# Patient Record
Sex: Female | Born: 1970 | Race: White | Hispanic: No | Smoking: Never smoker
Health system: Southern US, Community
[De-identification: ages and names within clinical notes are randomized; demographics above are authoritative.]

## PROBLEM LIST (undated history)

## (undated) ENCOUNTER — Emergency Department

## (undated) DIAGNOSIS — G894 Chronic pain syndrome: Secondary | ICD-10-CM

## (undated) DIAGNOSIS — G8929 Other chronic pain: Secondary | ICD-10-CM

## (undated) DIAGNOSIS — K589 Irritable bowel syndrome without diarrhea: Secondary | ICD-10-CM

## (undated) DIAGNOSIS — G4730 Sleep apnea, unspecified: Secondary | ICD-10-CM

## (undated) DIAGNOSIS — K219 Gastro-esophageal reflux disease without esophagitis: Secondary | ICD-10-CM

## (undated) DIAGNOSIS — M5416 Radiculopathy, lumbar region: Secondary | ICD-10-CM

## (undated) DIAGNOSIS — F419 Anxiety disorder, unspecified: Secondary | ICD-10-CM

## (undated) DIAGNOSIS — Z87442 Personal history of urinary calculi: Secondary | ICD-10-CM

## (undated) DIAGNOSIS — M5412 Radiculopathy, cervical region: Secondary | ICD-10-CM

## (undated) DIAGNOSIS — G4700 Insomnia, unspecified: Secondary | ICD-10-CM

## (undated) DIAGNOSIS — R011 Cardiac murmur, unspecified: Secondary | ICD-10-CM

## (undated) DIAGNOSIS — D219 Benign neoplasm of connective and other soft tissue, unspecified: Secondary | ICD-10-CM

## (undated) DIAGNOSIS — F329 Major depressive disorder, single episode, unspecified: Secondary | ICD-10-CM

## (undated) DIAGNOSIS — M1990 Unspecified osteoarthritis, unspecified site: Secondary | ICD-10-CM

## (undated) DIAGNOSIS — I10 Essential (primary) hypertension: Secondary | ICD-10-CM

## (undated) DIAGNOSIS — N200 Calculus of kidney: Secondary | ICD-10-CM

## (undated) DIAGNOSIS — F32A Depression, unspecified: Secondary | ICD-10-CM

## (undated) HISTORY — DX: Benign neoplasm of connective and other soft tissue, unspecified: D21.9

## (undated) HISTORY — DX: Gastro-esophageal reflux disease without esophagitis: K21.9

## (undated) HISTORY — PX: CHOLECYSTECTOMY: SHX55

## (undated) HISTORY — PX: TONSILLECTOMY: SUR1361

## (undated) HISTORY — DX: Depression, unspecified: F32.A

## (undated) HISTORY — DX: Calculus of kidney: N20.0

## (undated) HISTORY — PX: ROTATOR CUFF REPAIR: SHX139

## (undated) HISTORY — PX: GASTRIC ROUX-EN-Y: SHX5262

## (undated) HISTORY — DX: Insomnia, unspecified: G47.00

## (undated) HISTORY — DX: Anxiety disorder, unspecified: F41.9

## (undated) HISTORY — DX: Essential (primary) hypertension: I10

---
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
## 2008-12-04 HISTORY — PX: ROTATOR CUFF REPAIR: SHX139

## 2011-12-05 HISTORY — PX: GASTRIC ROUX-EN-Y: SHX5262

## 2013-04-14 DIAGNOSIS — F419 Anxiety disorder, unspecified: Secondary | ICD-10-CM | POA: Insufficient documentation

## 2014-04-17 DIAGNOSIS — N946 Dysmenorrhea, unspecified: Secondary | ICD-10-CM | POA: Insufficient documentation

## 2016-12-04 HISTORY — PX: CERVICAL CONE BIOPSY: SUR198

## 2016-12-04 HISTORY — PX: BLADDER STONE REMOVAL: SHX568

## 2017-07-03 DIAGNOSIS — M26629 Arthralgia of temporomandibular joint, unspecified side: Secondary | ICD-10-CM | POA: Insufficient documentation

## 2017-10-11 DIAGNOSIS — Z8659 Personal history of other mental and behavioral disorders: Secondary | ICD-10-CM | POA: Insufficient documentation

## 2017-10-11 DIAGNOSIS — Z9884 Bariatric surgery status: Secondary | ICD-10-CM | POA: Insufficient documentation

## 2017-10-11 DIAGNOSIS — K219 Gastro-esophageal reflux disease without esophagitis: Secondary | ICD-10-CM | POA: Insufficient documentation

## 2018-12-16 ENCOUNTER — Emergency Department: Payer: BLUE CROSS/BLUE SHIELD

## 2018-12-16 ENCOUNTER — Encounter: Payer: Self-pay | Admitting: Emergency Medicine

## 2018-12-16 ENCOUNTER — Emergency Department
Admission: EM | Admit: 2018-12-16 | Discharge: 2018-12-16 | Disposition: A | Discharge status: Home / Self Care | Payer: BLUE CROSS/BLUE SHIELD | Attending: Emergency Medicine | Admitting: Emergency Medicine

## 2018-12-16 ENCOUNTER — Other Ambulatory Visit: Payer: Self-pay

## 2018-12-16 DIAGNOSIS — N390 Urinary tract infection, site not specified: Secondary | ICD-10-CM

## 2018-12-16 DIAGNOSIS — R1032 Left lower quadrant pain: Secondary | ICD-10-CM | POA: Diagnosis present

## 2018-12-16 LAB — URINALYSIS, COMPLETE (UACMP) WITH MICROSCOPIC
Bilirubin Urine: NEGATIVE
Glucose, UA: NEGATIVE mg/dL
Ketones, ur: 5 mg/dL — AB
Nitrite: NEGATIVE
Protein, ur: 100 mg/dL — AB
RBC / HPF: >50 RBC/hpf — ABNORMAL HIGH (ref 0–5)
Specific Gravity, Urine: 1.027 (ref 1.005–1.030)
Squamous Epithelial / HPF: >50 — ABNORMAL HIGH (ref 0–5)
WBC, UA: >50 WBC/hpf — ABNORMAL HIGH (ref 0–5)
pH: 5 (ref 5.0–8.0)

## 2018-12-16 LAB — COMPREHENSIVE METABOLIC PANEL
ALT: 68 U/L — ABNORMAL HIGH (ref 0–44)
AST: 81 U/L — ABNORMAL HIGH (ref 15–41)
Albumin: 4.8 g/dL (ref 3.5–5.0)
Alkaline Phosphatase: 107 U/L (ref 38–126)
Anion gap: 12 (ref 5–15)
BUN: 19 mg/dL (ref 6–20)
CO2: 22 mmol/L (ref 22–32)
Calcium: 9.4 mg/dL (ref 8.9–10.3)
Chloride: 105 mmol/L (ref 98–111)
Creatinine, Ser: 1.14 mg/dL — ABNORMAL HIGH (ref 0.44–1.00)
GFR calc Af Amer: >60 mL/min (ref >60)
GFR calc non Af Amer: 56 mL/min — ABNORMAL LOW (ref >60)
Glucose, Bld: 124 mg/dL — ABNORMAL HIGH (ref 70–99)
Potassium: 4.1 mmol/L (ref 3.5–5.1)
Sodium: 139 mmol/L (ref 135–145)
Total Bilirubin: 1.6 mg/dL — ABNORMAL HIGH (ref 0.3–1.2)
Total Protein: 8.3 g/dL — ABNORMAL HIGH (ref 6.5–8.1)

## 2018-12-16 LAB — CBC
HCT: 43.9 % (ref 36.0–46.0)
Hemoglobin: 13.9 g/dL (ref 12.0–15.0)
MCH: 27.9 pg (ref 26.0–34.0)
MCHC: 31.7 g/dL (ref 30.0–36.0)
MCV: 88 fL (ref 80.0–100.0)
Platelets: 312 10*3/uL (ref 150–400)
RBC: 4.99 MIL/uL (ref 3.87–5.11)
RDW: 13 % (ref 11.5–15.5)
WBC: 14.3 10*3/uL — ABNORMAL HIGH (ref 4.0–10.5)
nRBC: 0 % (ref 0.0–0.2)

## 2018-12-16 LAB — POCT PREGNANCY, URINE: Preg Test, Ur: NEGATIVE

## 2018-12-16 MED ORDER — KETOROLAC TROMETHAMINE 30 MG/ML IJ SOLN
30.0000 mg | Freq: Once | INTRAMUSCULAR | Status: AC
  Administered 2018-12-16: 30 mg via INTRAVENOUS
  Filled 2018-12-16: qty 1

## 2018-12-16 MED ORDER — SODIUM CHLORIDE 0.9 % IV SOLN
1000.0000 mL | Freq: Once | INTRAVENOUS | Status: AC
  Administered 2018-12-16: 1000 mL via INTRAVENOUS

## 2018-12-16 MED ORDER — NITROFURANTOIN MONOHYD MACRO 100 MG PO CAPS: 100.0000 mg | ORAL_CAPSULE | Freq: Two times a day (BID) | ORAL | 0 refills | Status: AC

## 2018-12-16 MED ORDER — SODIUM CHLORIDE 0.9 % IV BOLUS
1000.0000 mL | Freq: Once | INTRAVENOUS | Status: AC
  Administered 2018-12-16: 1000 mL via INTRAVENOUS

## 2018-12-16 MED ORDER — ONDANSETRON HCL 4 MG/2ML IJ SOLN
4.0000 mg | Freq: Once | INTRAMUSCULAR | Status: AC
  Administered 2018-12-16: 4 mg via INTRAVENOUS
  Filled 2018-12-16: qty 2

## 2018-12-16 MED ORDER — DIPHENHYDRAMINE HCL 25 MG PO CAPS
25.0000 mg | ORAL_CAPSULE | Freq: Once | ORAL | Status: AC
  Administered 2018-12-16: 25 mg via ORAL

## 2018-12-16 MED ORDER — CIPROFLOXACIN IN D5W 400 MG/200ML IV SOLN
400.0000 mg | Freq: Once | INTRAVENOUS | Status: AC
  Administered 2018-12-16: 400 mg via INTRAVENOUS
  Filled 2018-12-16: qty 200

## 2018-12-16 MED ORDER — DIPHENHYDRAMINE HCL 25 MG PO CAPS
ORAL_CAPSULE | ORAL | Status: AC
  Filled 2018-12-16: qty 1

## 2018-12-16 NOTE — ED Notes (Signed)
Pt was given food tray.

## 2018-12-16 NOTE — ED Notes (Signed)
First nurse note:pt arrived via ems from [place] clinic at [facility] with chief complaints of fever & weakness since this morning. Pt also reports left flank pain. Pt negative for flu and UTI at [facility]. HR 97, BP 101/59, 98% on room air.

## 2018-12-16 NOTE — ED Triage Notes (Signed)
Flank pain x 4 days. Dysuria x 2 days. History of kidney infections.

## 2018-12-16 NOTE — ED Provider Notes (Signed)
[facility] Emergency Department Provider Note   ____________________________________________    I have reviewed the triage vital signs and the nursing notes.   HISTORY  Chief Complaint Flank Pain     HPI Natasha Estrada is a 48 y.o. female who presents with complaints of dysuria, urinary frequency and flank pain.  She also reports she had a fever today of 103.0.  She notes that she has had a long history of problems with urinary tract infections and in fact was on daily Macrobid for a long time until recently moving to the area.  She took Azo several days ago with some improvement in her symptoms.  However today she feels weak achy and has a fever.  Positive nausea.  History reviewed. No pertinent past medical history.  There are no active problems to display for this patient.   History reviewed. No pertinent surgical history.  Prior to Admission medications   Medication Sig Start Date End Date Taking? Authorizing Provider  nitrofurantoin, macrocrystal-monohydrate, (MACROBID) 100 MG capsule Take 1 capsule (100 mg total) by mouth 2 (two) times daily for 7 days. 12/16/18 12/23/18  Lavonia Drafts, MD     Allergies Cefuroxime and Sulfa antibiotics  No family history on file.  Social History Does not smoke, no  daily alcohol Review of Systems  Constitutional: Fever as above Eyes: No visual changes.  ENT: No sore throat. Cardiovascular: Denies chest pain. Respiratory: Denies shortness of breath. Gastrointestinal: As above.   Genitourinary: As above. Musculoskeletal: "Achy " Skin: Negative for rash. Neurological: Negative for headaches or weakness   ____________________________________________   PHYSICAL EXAM:  VITAL SIGNS: ED Triage Vitals  Enc Vitals Group     BP 12/16/18 1238 (!) 114/96     Pulse Rate 12/16/18 1238 (!) 107     Resp 12/16/18 1238 18     Temp 12/16/18 1238 98.8 F (37.1 C)     Temp Source 12/16/18 1238 Oral   SpO2 12/16/18 1238 99 %     Weight 12/16/18 1239 76.2 kg (168 lb)     Height 12/16/18 1239 1.651 m (5\' 5" )     Head Circumference --      Peak Flow --      Pain Score 12/16/18 1239 7     Pain Loc --      Pain Edu? --      Excl. in [place]? --     Constitutional: Alert and oriented. Eyes: Conjunctivae are normal.   Nose: No congestion/rhinnorhea. Mouth/Throat: Mucous membranes are moist.    Cardiovascular: Normal rate, regular rhythm. Grossly normal heart sounds.  Good peripheral circulation. Respiratory: Normal respiratory effort.  No retractions. Lungs CTAB. Gastrointestinal: Soft and nontender. No distention.  Mild left CVA tenderness  Musculoskeletal:  Warm and well perfused Neurologic:  Normal speech and language. No gross focal neurologic deficits are appreciated.  Skin:  Skin is warm, dry and intact. No rash noted. Psychiatric: Mood and affect are normal. Speech and behavior are normal.  ____________________________________________   LABS (all labs ordered are listed, but only abnormal results are displayed)  Labs Reviewed  COMPREHENSIVE METABOLIC PANEL - Abnormal; Notable for the following components:      Result Value   Glucose, Bld 124 (*)    Creatinine, Ser 1.14 (*)    Total Protein 8.3 (*)    AST 81 (*)    ALT 68 (*)    Total Bilirubin 1.6 (*)    GFR calc non Af Amer 56 (*)  All other components within normal limits  CBC - Abnormal; Notable for the following components:   WBC 14.3 (*)    All other components within normal limits  URINALYSIS, COMPLETE (UACMP) WITH MICROSCOPIC - Abnormal; Notable for the following components:   Color, Urine AMBER (*)    APPearance TURBID (*)    Hgb urine dipstick SMALL (*)    Ketones, ur 5 (*)    Protein, ur 100 (*)    Leukocytes, UA MODERATE (*)    RBC / HPF >50 (*)    WBC, UA >50 (*)    Bacteria, UA MANY (*)    Squamous Epithelial / LPF >50 (*)    All other components within normal limits  POCT PREGNANCY, URINE  POC  URINE PREG, ED   ____________________________________________  EKG  None ____________________________________________  RADIOLOGY  CT renal stone study ____________________________________________   PROCEDURES  Procedure(s) performed: No  Procedures   Critical Care performed: No ____________________________________________   INITIAL IMPRESSION / ASSESSMENT AND PLAN / ED COURSE  Pertinent labs & imaging results that were available during my care of the patient were reviewed by me and considered in my medical decision making (see chart for details).  Patient presents with dysuria, frequency, reported fever at home.  White blood cell count is elevated here.  Urinalysis consistent with urinary tract infection.  Patient does have a history of kidney stones as well, will send for CT renal stone study.  Suspect severe urinary tract infection, possibly pyelonephritis.  We will give IV antibiotics here, patient has an allergy to cefuroxime so we will give Cipro IV  ----------------------------------------- 7:11 PM on 12/16/2018 -----------------------------------------  CT negative for ureterolithiasis, patient felt better after fluids and antibiotics.  Appropriate for discharge at this time, strict return precautions discussed, patient agrees with plan    ____________________________________________   FINAL CLINICAL IMPRESSION(S) / ED DIAGNOSES  Final diagnoses:  Lower urinary tract infectious disease        Note:  This document was prepared using Dragon voice recognition software and may include unintentional dictation errors.   Lavonia Drafts, MD 12/16/18 1911

## 2018-12-16 NOTE — ED Notes (Signed)
Pt requesting "something to make me rest". MD made aware.

## 2019-04-09 ENCOUNTER — Ambulatory Visit: Payer: Self-pay | Admitting: Urology

## 2019-05-19 ENCOUNTER — Ambulatory Visit: Payer: Self-pay | Admitting: Urology

## 2019-06-16 ENCOUNTER — Encounter

## 2019-06-16 ENCOUNTER — Encounter: Payer: Self-pay | Admitting: Urology

## 2019-06-16 ENCOUNTER — Ambulatory Visit: Payer: BLUE CROSS/BLUE SHIELD | Admitting: Urology

## 2019-08-29 DIAGNOSIS — Z9889 Other specified postprocedural states: Secondary | ICD-10-CM | POA: Insufficient documentation

## 2019-08-29 DIAGNOSIS — M7582 Other shoulder lesions, left shoulder: Secondary | ICD-10-CM | POA: Insufficient documentation

## 2019-08-29 DIAGNOSIS — M75112 Incomplete rotator cuff tear or rupture of left shoulder, not specified as traumatic: Secondary | ICD-10-CM | POA: Insufficient documentation

## 2019-09-12 ENCOUNTER — Other Ambulatory Visit: Payer: Self-pay

## 2019-09-12 ENCOUNTER — Encounter: Payer: Self-pay | Admitting: Urology

## 2019-09-12 ENCOUNTER — Ambulatory Visit: Payer: BC Managed Care – PPO | Admitting: Urology

## 2019-09-12 VITALS — BP 150/98 | HR 87 | Ht 65.0 in | Wt 170.0 lb

## 2019-09-12 DIAGNOSIS — N920 Excessive and frequent menstruation with regular cycle: Secondary | ICD-10-CM | POA: Insufficient documentation

## 2019-09-12 DIAGNOSIS — N200 Calculus of kidney: Secondary | ICD-10-CM

## 2019-09-12 DIAGNOSIS — N390 Urinary tract infection, site not specified: Secondary | ICD-10-CM | POA: Diagnosis not present

## 2019-09-12 DIAGNOSIS — G4700 Insomnia, unspecified: Secondary | ICD-10-CM | POA: Insufficient documentation

## 2019-09-12 LAB — URINALYSIS, COMPLETE
Bilirubin, UA: NEGATIVE
Glucose, UA: NEGATIVE
Ketones, UA: NEGATIVE
Leukocytes,UA: NEGATIVE
Nitrite, UA: NEGATIVE
Protein,UA: NEGATIVE
Specific Gravity, UA: 1.025 (ref 1.005–1.030)
Urobilinogen, Ur: 1 mg/dL (ref 0.2–1.0)
pH, UA: 7 (ref 5.0–7.5)

## 2019-09-12 LAB — MICROSCOPIC EXAMINATION

## 2019-09-12 MED ORDER — NITROFURANTOIN MACROCRYSTAL 100 MG PO CAPS: ORAL_CAPSULE | ORAL | 1 refills | Status: DC

## 2019-09-12 NOTE — Progress Notes (Signed)
09/12/2019 11:38 AM   Natasha Estrada 10-27-1971 [id number]  Referring provider: Wayland Denis, PA-C [address] [place],  [place] [address]  Chief Complaint  Patient presents with  . Recurrent UTI    HPI: Natasha Estrada is a 48 YO female seen in consultation at the request of Wayland Denis, PA-C for evaluation of recurrent UTIs.  She states she has a lifelong history of recurrent urinary tract infections.  For several years she was on postcoital prophylaxis with nitrofurantoin but not recently.  She has most recently been followed by [facility] and was last seen there around 2018.  She has a history of recurrent stone disease and underwent ureteroscopic stone removal and her own in November 2018.  She has a prior history of kidney stone surgery in 2008.  Most recent imaging remarkable for a stone protocol CT of the abdomen and pelvis January 2020 which showed bilateral, nonobstructing renal calculi the largest measuring 4 mm.  She was seen at [facility] January 2020 with urgency, dysuria and fever of 103 degrees.  Urinalysis showed pyuria.  Was recommended she be evaluated in the ED.  CT was performed with findings as above.  She received 1 dose of IV Cipro however was discharged on Macrobid.  Urine culture did grow greater than 100,000 E. coli.  After completing the Macrobid course she states her symptoms never completely resolved.  Follow-up urine cultures in early/late March and early May all grew E. coli.  She was treated with doxycycline and then another round of Macrobid.  She states her symptoms did not clear until she completed a course of Augmentin and has been asymptomatic since that time.  She has had prior UTI symptoms after intercourse though her husband has been in poor health and does not have frequent sexual activity.   PMH: Past Medical History:  Diagnosis Date  . Anxiety   . Depression   . Fibroid   . GERD (gastroesophageal  reflux disease)   . Hypertension   . Insomnia   . Kidney stone     Surgical History: Past Surgical History:  Procedure Laterality Date  . GASTRIC ROUX-EN-Y    . ROTATOR CUFF REPAIR      Home Medications:  Allergies as of 09/12/2019      Reactions   Cefuroxime Shortness Of Breath   Sulfa Antibiotics Rash      Medication List       Accurate as of September 12, 2019 11:38 AM. If you have any questions, ask your nurse or doctor.        buPROPion 150 MG 24 hr tablet Commonly known as: WELLBUTRIN XL   busPIRone 7.5 MG tablet Commonly known as: BUSPAR TK 1 T PO BID   cyanocobalamin 1000 MCG/ML injection Commonly known as: (VITAMIN B-12) Inject into the muscle.   cyclobenzaprine 10 MG tablet Commonly known as: FLEXERIL TK 1 T PO BID UTD   gabapentin 100 MG capsule Commonly known as: NEURONTIN TK 1 C PO BID   gabapentin 300 MG capsule Commonly known as: NEURONTIN TK 2 CS PO QD HS   levonorgestrel 20 MCG/24HR IUD Commonly known as: MIRENA by Intrauterine route.   linaclotide 145 MCG Caps capsule Commonly known as: LINZESS Take by mouth.   losartan 25 MG tablet Commonly known as: COZAAR TK 1 T PO QD   meloxicam 15 MG tablet Commonly known as: MOBIC TK 1 T PO D.   omeprazole 20 MG capsule Commonly known as: PRILOSEC  Take by mouth.   propranolol 40 MG tablet Commonly known as: INDERAL TAKE 1 TABLET(40 MG) BY MOUTH TWICE DAILY AS NEEDED. PATIENT TAKE AS NEEDED   venlafaxine XR 75 MG 24 hr capsule Commonly known as: EFFEXOR-XR TK 1 C PO QD       Allergies:  Allergies  Allergen Reactions  . Cefuroxime Shortness Of Breath  . Sulfa Antibiotics Rash    Family History: No family history on file.  Social History:  reports that she has never smoked. She has never used smokeless tobacco. She reports previous alcohol use. She reports that she does not use drugs.  ROS: UROLOGY Frequent Urination?: No Hard to postpone urination?: No Burning/pain with  urination?: No Get up at night to urinate?: Yes Leakage of urine?: No Urine stream starts and stops?: Yes Trouble starting stream?: No Do you have to strain to urinate?: No Blood in urine?: No Urinary tract infection?: No Sexually transmitted disease?: No Injury to kidneys or bladder?: No Painful intercourse?: No Weak stream?: No Currently pregnant?: No Vaginal bleeding?: No Last menstrual period?: IUD  Gastrointestinal Nausea?: No Vomiting?: No Indigestion/heartburn?: No Diarrhea?: No Constipation?: No  Constitutional Fever: No Night sweats?: No Weight loss?: No Fatigue?: No  Skin Skin rash/lesions?: No Itching?: No  Eyes Blurred vision?: Yes Double vision?: No  Ears/Nose/Throat Sore throat?: No Sinus problems?: Yes  Hematologic/Lymphatic Swollen glands?: No Easy bruising?: Yes  Cardiovascular Leg swelling?: No Chest pain?: No  Respiratory Cough?: No Shortness of breath?: No  Endocrine Excessive thirst?: No  Musculoskeletal Back pain?: Yes Joint pain?: Yes  Neurological Headaches?: Yes Dizziness?: No  Psychologic Depression?: Yes Anxiety?: Yes  Physical Exam: BP (!) 150/98 (BP Location: Left Arm, Patient Position: Sitting, Cuff Size: Normal)   Pulse 87   Ht 5\' 5"  (1.651 m)   Wt 170 lb (77.1 kg)   BMI 28.29 kg/m   Constitutional:  Alert and oriented, No acute distress. HEENT: [place] AT, moist mucus membranes.  Trachea midline, no masses. Cardiovascular: No clubbing, cyanosis, or edema. Respiratory: Normal respiratory effort, no increased work of breathing. GI: Abdomen is soft, nontender, nondistended, no abdominal masses GU: No CVA tenderness Lymph: No cervical or inguinal lymphadenopathy. Skin: No rashes, bruises or suspicious lesions. Neurologic: Grossly intact, no focal deficits, moving all 4 extremities. Psychiatric: Normal mood and affect.  Laboratory Data:  Urinalysis Dipstick trace blood, leukocytes negative Microscopy  negative  Pertinent Imaging: CT images were reviewed  Results for orders placed during the hospital encounter of 12/16/18  CT RENAL STONE STUDY   Narrative CLINICAL DATA:  48 year old female with history of left-sided flank pain.  EXAM: CT ABDOMEN AND PELVIS WITHOUT CONTRAST  TECHNIQUE: Multidetector CT imaging of the abdomen and pelvis was performed following the standard protocol without IV contrast.  COMPARISON:  No priors.  FINDINGS: Lower chest: Unremarkable.  Hepatobiliary: No definite suspicious appearing cystic or solid hepatic lesions are confidently identified on today's noncontrast CT examination. Status post cholecystectomy.  Pancreas: No definite pancreatic mass or peripancreatic fluid or inflammatory changes are noted on today's noncontrast CT examination.  Spleen: Unremarkable.  Adrenals/Urinary Tract: Several small nonobstructive calculi are noted within the left renal collecting system, largest of which is in the interpolar region measuring 4 mm. 2 mm nonobstructive calculus in the lower pole collecting system of the right kidney also noted. No additional calculi are noted along the course of either ureter, or within the lumen of the urinary bladder. No hydroureteronephrosis. Multiple capsular calcifications are noted in the right  kidney, likely related to remote trauma. Unenhanced appearance of the urinary bladder is normal. Bilateral adrenal glands are normal in appearance.  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 No pathologic dilatation of small bowel or colon. Normal appendix.  Vascular/Lymphatic: No atherosclerotic calcifications in the abdominal aorta or pelvic vasculature. No lymphadenopathy noted in the abdomen or pelvis.  Reproductive: IUD present in the uterus. Ovaries are unremarkable in appearance.  Other: No significant volume of ascites.  No pneumoperitoneum.   Musculoskeletal: There are no aggressive appearing lytic or blastic lesions noted in the visualized portions of the skeleton.  IMPRESSION: 1. Several nonobstructive calculi are noted within both renal collecting systems (left greater than right) measuring up to 4 mm. No ureteral stones and no hydroureteronephrosis to indicate urinary tract obstruction at this time. 2. No other acute findings are noted to account for the patient's history of left-sided flank pain. 3. Additional incidental findings, as above.   Electronically Signed   By: Vinnie Langton M.D.   On: 12/16/2018 15:18     Assessment & Plan:    - Recurrent UTI Febrile UTI January 2020 consistent with pyelonephritis.  Nitrofurantoin does not achieve tissue penetration and is not an appropriate choice for febrile UTI/pyelonephritis.  Her symptoms have presently resolved.  Will place back on nitrofurantoin prophylaxis however instead of a daily dose only postcoital.  I also recommended she start cranberry tablets and D-mannose 2 g daily.  Follow-up 3 months or as needed for recurrent symptoms.   Abbie Sons, [place] [address], [place], [place] [address] [phone]

## 2019-10-06 ENCOUNTER — Encounter: Payer: Self-pay | Admitting: Emergency Medicine

## 2019-10-06 ENCOUNTER — Telehealth: Payer: Self-pay | Admitting: Emergency Medicine

## 2019-10-06 ENCOUNTER — Emergency Department
Admission: EM | Admit: 2019-10-06 | Discharge: 2019-10-06 | Disposition: A | Discharge status: Home / Self Care | Payer: BC Managed Care – PPO | Attending: Emergency Medicine | Admitting: Emergency Medicine

## 2019-10-06 ENCOUNTER — Emergency Department: Payer: BC Managed Care – PPO

## 2019-10-06 ENCOUNTER — Other Ambulatory Visit: Payer: Self-pay

## 2019-10-06 DIAGNOSIS — N390 Urinary tract infection, site not specified: Secondary | ICD-10-CM | POA: Insufficient documentation

## 2019-10-06 DIAGNOSIS — J0100 Acute maxillary sinusitis, unspecified: Secondary | ICD-10-CM | POA: Insufficient documentation

## 2019-10-06 DIAGNOSIS — U071 COVID-19: Secondary | ICD-10-CM | POA: Diagnosis not present

## 2019-10-06 DIAGNOSIS — I10 Essential (primary) hypertension: Secondary | ICD-10-CM | POA: Diagnosis not present

## 2019-10-06 DIAGNOSIS — Z79899 Other long term (current) drug therapy: Secondary | ICD-10-CM | POA: Diagnosis not present

## 2019-10-06 DIAGNOSIS — R05 Cough: Secondary | ICD-10-CM | POA: Diagnosis present

## 2019-10-06 LAB — URINALYSIS, COMPLETE (UACMP) WITH MICROSCOPIC
Bilirubin Urine: NEGATIVE
Glucose, UA: NEGATIVE mg/dL
Hgb urine dipstick: NEGATIVE
Ketones, ur: NEGATIVE mg/dL
Leukocytes,Ua: NEGATIVE
Nitrite: POSITIVE — AB
Protein, ur: NEGATIVE mg/dL
Specific Gravity, Urine: 1.012 (ref 1.005–1.030)
pH: 6 (ref 5.0–8.0)

## 2019-10-06 LAB — SARS CORONAVIRUS 2 (TAT 6-24 HRS): SARS Coronavirus 2: POSITIVE — AB

## 2019-10-06 MED ORDER — FEXOFENADINE-PSEUDOEPHED ER 60-120 MG PO TB12: 1.0000 | ORAL_TABLET | Freq: Two times a day (BID) | ORAL | 0 refills | Status: DC

## 2019-10-06 MED ORDER — CLINDAMYCIN HCL 300 MG PO CAPS: 300.0000 mg | ORAL_CAPSULE | Freq: Three times a day (TID) | ORAL | 0 refills | Status: AC

## 2019-10-06 MED ORDER — BENZONATATE 100 MG PO CAPS: 200.0000 mg | ORAL_CAPSULE | Freq: Three times a day (TID) | ORAL | 0 refills | Status: DC | PRN

## 2019-10-06 MED ORDER — IBUPROFEN 600 MG PO TABS: 600.0000 mg | ORAL_TABLET | Freq: Three times a day (TID) | ORAL | 0 refills | Status: DC | PRN

## 2019-10-06 NOTE — ED Triage Notes (Addendum)
Pt reports feeling bad for 5 days-cough, body aches, "sloshing in my ears"; body aches and runny nose; pt says she has a co-worker that's been coughing at work but no known COVID; reports temp 100.2 at home; talking in complete coherent sentences; pt adds she's recently been down at [facility]; husband is also here to be seen with similar symptoms; pt has taken no medication for her symptoms, "I thought I'd wait until I got here"

## 2019-10-06 NOTE — ED Notes (Signed)
See triage note  Presents with body aches ,slight cough and subjective since yesterday   Low grade fever on arrival

## 2019-10-06 NOTE — ED Provider Notes (Signed)
[facility] Emergency Department Provider Note   ____________________________________________   First MD Initiated Contact with Patient 10/06/19 [phone]     (approximate)  I have reviewed the triage vital signs and the nursing notes.   HISTORY  Chief Complaint Cough, Generalized Body Aches, Nasal Congestion, and Otalgia    HPI Natasha Estrada is a 48 y.o. female patient presents with fatigue, body aches, nonproductive cough, ear pressure, intermittent runny nose and nasal congestion.  Patient just returned back from [facility] yesterday for her husband who is also here with same complaints.  Patient denies nausea, vomiting, diarrhea.  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  Patient stated no known exposure to COVID-19.  Patient also complained of dysuria.  History of kidney stones.  Patient rates her pain discomfort a 5/10.  Patient described the pain as "achy".  No palliative measures for complaint.         Past Medical History:  Diagnosis Date  . Anxiety   . Depression   . Fibroid   . GERD (gastroesophageal reflux disease)   . Hypertension   . Insomnia   . Kidney stone     Patient Active Problem List   Diagnosis Date Noted  . Insomnia 09/12/2019  . Menorrhagia with regular cycle 09/12/2019  . Nephrolithiasis 09/12/2019  . Recurrent UTI 09/12/2019  . Nontraumatic incomplete tear of left rotator cuff 08/29/2019  . Rotator cuff tendinitis, left 08/29/2019  . Status post left rotator cuff repair 08/29/2019  . Gastroesophageal reflux disease without esophagitis 10/11/2017  . History of depression 10/11/2017  . 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 Dysmenorrhea 04/17/2014  . Anxiety 04/14/2013    Past Surgical History:  Procedure Laterality Date  . GASTRIC ROUX-EN-Y    . ROTATOR CUFF REPAIR      Prior to Admission medications   Medication Sig Start  Date End Date Taking? Authorizing Provider  benzonatate (TESSALON PERLES) 100 MG capsule Take 2 capsules (200 mg total) by mouth 3 (three) times daily as needed. 10/06/19 10/05/20  Sable Feil, PA-C  buPROPion (WELLBUTRIN XL) 150 MG 24 hr tablet  08/27/19   [provider]  busPIRone (BUSPAR) 7.5 MG tablet TK 1 T PO BID 07/31/19   [provider]  clindamycin (CLEOCIN) 300 MG capsule Take 1 capsule (300 mg total) by mouth 3 (three) times daily for 10 days. 10/06/19 10/16/19  Sable Feil, PA-C  cyanocobalamin (,VITAMIN B-12,) 1000 MCG/ML injection Inject into the muscle. 12/23/18   [provider]  cyclobenzaprine (FLEXERIL) 10 MG tablet TK 1 T PO BID UTD 09/03/19   [provider]  fexofenadine-pseudoephedrine (ALLEGRA-D) 60-120 MG 12 hr tablet Take 1 tablet by mouth 2 (two) times daily. 10/06/19   Sable Feil, PA-C  gabapentin (NEURONTIN) 100 MG capsule TK 1 C PO BID 09/03/19   [provider]  gabapentin (NEURONTIN) 300 MG capsule TK 2 CS PO QD HS 09/03/19   [provider]  ibuprofen (ADVIL) 600 MG tablet Take 1 tablet (600 mg total) by mouth every 8 (eight) hours as needed. 10/06/19   Sable Feil, PA-C  levonorgestrel (MIRENA) 20 MCG/24HR IUD by Intrauterine route.    [provider]  linaclotide Rolan Lipa) 145 MCG CAPS capsule Take by mouth. 12/23/18   [provider]  losartan (COZAAR) 25 MG tablet TK 1 T PO QD 08/26/19   [provider]  meloxicam (MOBIC) 15 MG tablet TK 1 T PO D. 08/07/19   [provider]  nitrofurantoin (MACRODANTIN) 100 MG capsule 1 capsule p.o. after intercourse 09/12/19   Stoioff, Ronda Fairly, MD  omeprazole (PRILOSEC) 20 MG capsule Take by mouth. 09/07/15 12/27/19  [provider]  propranolol (INDERAL) 40 MG tablet TAKE 1 TABLET(40 MG) BY MOUTH TWICE DAILY AS NEEDED. PATIENT TAKE AS NEEDED 05/26/19   [provider]  venlafaxine XR (EFFEXOR-XR) 75 MG 24 hr capsule TK 1  C PO QD 06/24/19   [provider]    Allergies Cefuroxime and Sulfa antibiotics  History reviewed. No pertinent family history.  Social History Social History   Tobacco Use  . Smoking status: Never Smoker  . Smokeless tobacco: Never Used  Substance Use Topics  . Alcohol use: Not Currently  . Drug use: Never    Review of Systems Constitutional: No fever/chills Eyes: No visual changes. ENT: No sore throat. Cardiovascular: Denies chest pain. Respiratory: Nonproductive cough. Gastrointestinal: No abdominal pain.  No nausea, no vomiting.  No diarrhea.  No constipation. Genitourinary: Positive for dysuria. Musculoskeletal: Negative for back pain. Skin: Negative for rash. Neurological: Negative for headaches, focal weakness or numbness. Psychiatric:  Anxiety and depression. Endocrine:  Hypertension Allergic/Immunilogical: Cephalosporins and sulfa medications. ____________________________________________   PHYSICAL EXAM:  VITAL SIGNS: ED Triage Vitals  Enc Vitals Group     BP 10/06/19 0509 122/86     Pulse Rate 10/06/19 0509 96     Resp --      Temp 10/06/19 0509 99.2 F (37.3 C)     Temp Source 10/06/19 0509 Oral     SpO2 10/06/19 0509 97 %     Weight 10/06/19 0509 175 lb (79.4 kg)     Height 10/06/19 0509 5\' 5"  (1.651 m)     Head Circumference --      Peak Flow --      Pain Score 10/06/19 0515 5     Pain Loc --      Pain Edu? --      Excl. in [place]? --     Constitutional: Alert and oriented. Well appearing and in no acute distress. Eyes: Conjunctivae are normal. PERRL. EOMI. Head: Atraumatic. Nose: Edematous nasal turbinates with thick rhinorrhea.  Bilateral maxillary guarding. Mouth/Throat: Mucous membranes are moist.  Oropharynx non-erythematous. Neck: No stridor.   Hematological/Lymphatic/Immunilogical: No cervical lymphadenopathy. Cardiovascular: Normal rate, regular rhythm. Grossly normal heart sounds.  Good peripheral circulation. Respiratory:  Normal respiratory effort.  No retractions. Lungs CTAB. Gastrointestinal: Soft and nontender. No distention. No abdominal bruits. No CVA tenderness. Genitourinary: Deferred Musculoskeletal: No lower extremity tenderness nor edema.  No joint effusions. Neurologic:  Normal speech and language. No gross focal neurologic deficits are appreciated. No gait instability. Skin:  Skin is warm, dry and intact. No rash noted. Psychiatric: Mood and affect are normal. Speech and behavior are normal.  ____________________________________________   LABS (all labs ordered are listed, but only abnormal results are displayed)  Labs Reviewed  URINALYSIS, COMPLETE (UACMP) WITH MICROSCOPIC - Abnormal; Notable for the following components:      Result Value   Color, Urine YELLOW (*)    APPearance CLEAR (*)    Nitrite POSITIVE (*)    Bacteria, UA FEW (*)    All other components within normal limits  SARS CORONAVIRUS 2 (TAT 6-24 HRS)   ____________________________________________  EKG   ____________________________________________  RADIOLOGY  ED MD interpretation:    Official radiology report(s): Dg Chest  Portable 1 View  Result Date: 10/06/2019 CLINICAL DATA:  Pt reports feeling bad for 5 days-cough, body aches, "sloshing in my ears"; body aches and runny nose; pt says she has a co-worker that's been coughing at work but no known COVID; reports temp 100.2 at home; talking in complete coherent sentences; pt adds she's recently been down at [place] [facility] VIEW COMPARISON:  None. FINDINGS: Normal heart, mediastinum and hila. Clear lungs.  No pleural effusion or pneumothorax. Skeletal structures grossly intact. IMPRESSION: No active disease. Electronically Signed   By: Lajean Manes M.D.   On: 10/06/2019 08:03    ____________________________________________   PROCEDURES  Procedure(s) performed (including Critical Care):  Procedures    ____________________________________________   INITIAL IMPRESSION / ASSESSMENT AND PLAN / ED COURSE  As part of my medical decision making, I reviewed the following data within the [place] was evaluated in Emergency Department on 10/06/2019 for the symptoms described in the history of present illness. She was evaluated in the context of the global COVID-19 pandemic, which necessitated consideration that the patient might be at risk for infection with the SARS-CoV-2 virus that causes COVID-19. Institutional protocols and algorithms that pertain to the evaluation of patients at risk for COVID-19 are in a state of rapid change based on information released by regulatory bodies including the CDC and federal and state organizations. These policies and algorithms were followed during the patient's care in the ED.  Patient presents for URI signs and symptoms consistent with sinusitis.  Patient also has urinary tract infection.  Patient given Covid test results are pending.  Patient advised self quarantine and to take medication as directed pending COVID-19 test results.     ____________________________________________   FINAL CLINICAL IMPRESSION(S) / ED DIAGNOSES  Final diagnoses:  Subacute maxillary sinusitis  Urinary tract infection without hematuria, site unspecified     ED Discharge Orders         Ordered    clindamycin (CLEOCIN) 300 MG capsule  3 times daily     10/06/19 0829    fexofenadine-pseudoephedrine (ALLEGRA-D) 60-120 MG 12 hr tablet  2 times daily     10/06/19 0829    benzonatate (TESSALON PERLES) 100 MG capsule  3 times daily PRN     10/06/19 0829    ibuprofen (ADVIL) 600 MG tablet  Every 8 hours PRN     10/06/19 [id number]           Note:  This document was prepared using Dragon voice recognition software and may include unintentional dictation errors.    Sable Feil, PA-C 10/06/19 0831    Earleen Newport, MD  10/06/19 [phone]

## 2019-10-06 NOTE — Telephone Encounter (Signed)
Called pateint and informed of positive covid 19 test, isolation guidelines and need to notify anyone exposed.  Advised to follow up mild to moderate symytoms with pcp evisit or phone visit and return here for much worsening including difficulty breathing.

## 2019-10-07 NOTE — Telephone Encounter (Signed)
Patient called me back this am. 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  I sent her a copy by email at [email or web address].

## 2019-10-31 ENCOUNTER — Encounter: Payer: Self-pay | Admitting: Emergency Medicine

## 2019-10-31 ENCOUNTER — Ambulatory Visit
Admission: EM | Admit: 2019-10-31 | Discharge: 2019-10-31 | Disposition: A | Discharge status: Home / Self Care | Payer: BC Managed Care – PPO | Attending: Emergency Medicine | Admitting: Emergency Medicine

## 2019-10-31 ENCOUNTER — Other Ambulatory Visit: Payer: Self-pay

## 2019-10-31 DIAGNOSIS — N390 Urinary tract infection, site not specified: Secondary | ICD-10-CM | POA: Insufficient documentation

## 2019-10-31 LAB — POCT URINALYSIS DIP (MANUAL ENTRY)
Bilirubin, UA: NEGATIVE
Blood, UA: NEGATIVE
Glucose, UA: NEGATIVE mg/dL
Ketones, POC UA: NEGATIVE mg/dL
Nitrite, UA: POSITIVE — AB
Protein Ur, POC: NEGATIVE mg/dL
Spec Grav, UA: 1.02 (ref 1.010–1.025)
Urobilinogen, UA: 0.2 E.U./dL
pH, UA: 6 (ref 5.0–8.0)

## 2019-10-31 MED ORDER — AMOXICILLIN-POT CLAVULANATE 875-125 MG PO TABS: 1.0000 | ORAL_TABLET | Freq: Two times a day (BID) | ORAL | 0 refills | Status: AC

## 2019-10-31 NOTE — Discharge Instructions (Signed)
Your urine shows signs of an infection.  A urine culture is pending.    Take the antibiotic as directed.    Follow-up with your urologist on Monday.

## 2019-10-31 NOTE — ED Provider Notes (Signed)
Natasha Estrada    CSN: [id number] Arrival date & time: 10/31/19  1241      History   Chief Complaint Chief Complaint  Patient presents with  . Urinary Tract Infection    HPI Natasha Estrada is a 48 y.o. female.   Patient presents with 4-day history of dysuria, frequency, and urgency.  She has been treating her symptoms at home with OTC Azo with minimal relief.  She denies fever, chills, abdominal pain, back pain, vaginal discharge, pelvic pain, or other symptoms.  She was recently treated with clindamycin for sinusitis on 10/06/2019.  She reports she has frequent UTIs and is now seeing a urologist; last visit on 09/12/2019.  She states her last UTI did not respond to [place] and cleared up with Augmentin.    The history is provided by the patient.    Past Medical History:  Diagnosis Date  . Anxiety   . Depression   . Fibroid   . GERD (gastroesophageal reflux disease)   . Hypertension   . Insomnia   . Kidney stone     Patient Active Problem List   Diagnosis Date Noted  . Insomnia 09/12/2019  . Menorrhagia with regular cycle 09/12/2019  . Nephrolithiasis 09/12/2019  . Recurrent UTI 09/12/2019  . Nontraumatic incomplete tear of left rotator cuff 08/29/2019  . Rotator cuff tendinitis, left 08/29/2019  . Status post left rotator cuff repair 08/29/2019  . Gastroesophageal reflux disease without esophagitis 10/11/2017  . History of depression 10/11/2017  . 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 Dysmenorrhea 04/17/2014  . Anxiety 04/14/2013    Past Surgical History:  Procedure Laterality Date  . GASTRIC ROUX-EN-Y    . ROTATOR CUFF REPAIR      OB History   No obstetric history on file.      Home Medications    Prior to Admission medications   Medication Sig Start Date End Date Taking? Authorizing Provider  amoxicillin-clavulanate (AUGMENTIN) 875-125 MG tablet Take 1 tablet by mouth every 12 (twelve)  hours for 5 days. 10/31/19 11/05/19  Sharion Balloon, NP  benzonatate (TESSALON PERLES) 100 MG capsule Take 2 capsules (200 mg total) by mouth 3 (three) times daily as needed. 10/06/19 10/05/20  Sable Feil, PA-C  buPROPion (WELLBUTRIN XL) 150 MG 24 hr tablet  08/27/19   [provider]  busPIRone (BUSPAR) 7.5 MG tablet TK 1 T PO BID 07/31/19   [provider]  cyanocobalamin (,VITAMIN B-12,) 1000 MCG/ML injection Inject into the muscle. 12/23/18   [provider]  cyclobenzaprine (FLEXERIL) 10 MG tablet TK 1 T PO BID UTD 09/03/19   [provider]  fexofenadine-pseudoephedrine (ALLEGRA-D) 60-120 MG 12 hr tablet Take 1 tablet by mouth 2 (two) times daily. 10/06/19   Sable Feil, PA-C  gabapentin (NEURONTIN) 100 MG capsule TK 1 C PO BID 09/03/19   [provider]  gabapentin (NEURONTIN) 300 MG capsule TK 2 CS PO QD HS 09/03/19   [provider]  ibuprofen (ADVIL) 600 MG tablet Take 1 tablet (600 mg total) by mouth every 8 (eight) hours as needed. 10/06/19   Sable Feil, PA-C  levonorgestrel (MIRENA) 20 MCG/24HR IUD by Intrauterine route.    [provider]  linaclotide Rolan Lipa) 145 MCG CAPS capsule Take by mouth. 12/23/18   [provider]  losartan (COZAAR) 25 MG tablet TK 1 T PO QD 08/26/19   [provider]  meloxicam (MOBIC) 15  MG tablet TK 1 T PO D. 08/07/19   [provider]  nitrofurantoin (MACRODANTIN) 100 MG capsule 1 capsule p.o. after intercourse 09/12/19   Stoioff, Ronda Fairly, MD  omeprazole (PRILOSEC) 20 MG capsule Take by mouth. 09/07/15 12/27/19  [provider]  propranolol (INDERAL) 40 MG tablet TAKE 1 TABLET(40 MG) BY MOUTH TWICE DAILY AS NEEDED. PATIENT TAKE AS NEEDED 05/26/19   [provider]  venlafaxine XR (EFFEXOR-XR) 75 MG 24 hr capsule TK 1 C PO QD 06/24/19   [provider]    Family History History reviewed. No pertinent family history.  Social History Social  History   Tobacco Use  . Smoking status: Never Smoker  . Smokeless tobacco: Never Used  Substance Use Topics  . Alcohol use: Not Currently  . Drug use: Never     Allergies   Cefuroxime and Sulfa antibiotics   Review of Systems Review of Systems  Constitutional: Negative for chills and fever.  HENT: Negative for ear pain and sore throat.   Eyes: Negative for pain and visual disturbance.  Respiratory: Negative for cough and shortness of breath.   Cardiovascular: Negative for chest pain and palpitations.  Gastrointestinal: Negative for abdominal pain and vomiting.  Genitourinary: Positive for dysuria, frequency and urgency. Negative for flank pain, hematuria, pelvic pain and vaginal discharge.  Musculoskeletal: Negative for arthralgias and back pain.  Skin: Negative for color change and rash.  Neurological: Negative for seizures and syncope.  All other systems reviewed and are negative.    Physical Exam Triage Vital Signs ED Triage Vitals  Enc Vitals Group     BP 10/31/19 1258 128/83     Pulse Rate 10/31/19 1258 98     Resp 10/31/19 1258 18     Temp 10/31/19 1258 98.7 F (37.1 C)     Temp src --      SpO2 10/31/19 1258 96 %     Weight 10/31/19 1257 170 lb (77.1 kg)     Height --      Head Circumference --      Peak Flow --      Pain Score 10/31/19 1256 5     Pain Loc --      Pain Edu? --      Excl. in [place]? --    No data found.  Updated Vital Signs BP 128/83   Pulse 98   Temp 98.7 F (37.1 C)   Resp 18   Wt 170 lb (77.1 kg)   SpO2 96%   BMI 28.29 kg/m   Visual Acuity Right Eye Distance:   Left Eye Distance:   Bilateral Distance:    Right Eye Near:   Left Eye Near:    Bilateral Near:     Physical Exam Vitals signs and nursing note reviewed.  Constitutional:      General: She is not in acute distress.    Appearance: She is well-developed. She is not ill-appearing.  HENT:     Head: Normocephalic and atraumatic.     Mouth/Throat:     Mouth:  Mucous membranes are moist.  Eyes:     Conjunctiva/sclera: Conjunctivae normal.  Neck:     Musculoskeletal: Neck supple.  Cardiovascular:     Rate and Rhythm: Normal rate and regular rhythm.     Heart sounds: No murmur.  Pulmonary:     Effort: Pulmonary effort is normal. No respiratory distress.     Breath sounds: Normal breath sounds.  Abdominal:  General: Bowel sounds are normal. There is no distension.     Palpations: Abdomen is soft.     Tenderness: There is no abdominal tenderness. There is no right CVA tenderness, left CVA tenderness, guarding or rebound.  Skin:    General: Skin is warm and dry.  Neurological:     Mental Status: She is alert.      UC Treatments / Results  Labs (all labs ordered are listed, but only abnormal results are displayed) Labs Reviewed  POCT URINALYSIS DIP (MANUAL ENTRY) - Abnormal; Notable for the following components:      Result Value   Nitrite, UA Positive (*)    Leukocytes, UA Trace (*)    All other components within normal limits  URINE CULTURE    EKG   Radiology No results found.  Procedures Procedures (including critical care time)  Medications Ordered in UC Medications - No data to display  Initial Impression / Assessment and Plan / UC Course  I have reviewed the triage vital signs and the nursing notes.  Pertinent labs & imaging results that were available during my care of the patient were reviewed by me and considered in my medical decision making (see chart for details).    Urinary tract infection.  Urine culture pending.  Treating with Augmentin as patient reports this has previously worked for her UTIs.  Instructed patient to follow-up with her urologist next week.  Patient agrees to plan of care.     Final Clinical Impressions(s) / UC Diagnoses   Final diagnoses:  Urinary tract infection without hematuria, site unspecified     Discharge Instructions     Your urine shows signs of an infection.  A urine  culture is pending.    Take the antibiotic as directed.    Follow-up with your urologist on Monday.        ED Prescriptions    Medication Sig Dispense Auth. Provider   amoxicillin-clavulanate (AUGMENTIN) 875-125 MG tablet Take 1 tablet by mouth every 12 (twelve) hours for 5 days. 10 tablet Sharion Balloon, NP     PDMP not reviewed this encounter.   Sharion Balloon, NP 10/31/19 1334

## 2019-10-31 NOTE — ED Triage Notes (Signed)
Patient in office today c/o freq,urgency of urine bleeding and painful urination. Sx 10/27/19   OTC: azo

## 2019-11-02 LAB — URINE CULTURE: Culture: >=100000 — AB

## 2019-11-03 ENCOUNTER — Encounter (HOSPITAL_COMMUNITY): Payer: Self-pay

## 2019-11-03 ENCOUNTER — Telehealth (HOSPITAL_COMMUNITY): Payer: Self-pay | Admitting: Emergency Medicine

## 2019-11-03 NOTE — Telephone Encounter (Signed)
Urine culture was positive for ESCHERICHIA COLI and was given augmented   at urgent care visit. Attempted to reach patient. No answer at this time. Voicemail left.

## 2019-11-26 ENCOUNTER — Other Ambulatory Visit: Payer: Self-pay | Admitting: Student

## 2019-11-26 DIAGNOSIS — M542 Cervicalgia: Secondary | ICD-10-CM

## 2019-12-05 DIAGNOSIS — G950 Syringomyelia and syringobulbia: Secondary | ICD-10-CM

## 2019-12-05 HISTORY — DX: Syringomyelia and syringobulbia: G95.0

## 2019-12-15 ENCOUNTER — Ambulatory Visit: Payer: BC Managed Care – PPO | Admitting: Urology

## 2019-12-16 ENCOUNTER — Encounter: Payer: Self-pay | Admitting: Urology

## 2020-01-01 ENCOUNTER — Other Ambulatory Visit: Payer: Self-pay

## 2020-01-01 ENCOUNTER — Ambulatory Visit
Admission: RE | Admit: 2020-01-01 | Discharge: 2020-01-01 | Disposition: A | Discharge status: Home / Self Care | Source: Ambulatory Visit | Attending: Student | Admitting: Student

## 2020-01-01 DIAGNOSIS — M542 Cervicalgia: Secondary | ICD-10-CM | POA: Insufficient documentation

## 2020-02-27 ENCOUNTER — Ambulatory Visit: Payer: Self-pay

## 2020-04-13 ENCOUNTER — Ambulatory Visit: Payer: BC Managed Care – PPO | Admitting: Urology

## 2020-04-28 ENCOUNTER — Encounter: Payer: Self-pay | Admitting: Emergency Medicine

## 2020-04-28 ENCOUNTER — Emergency Department: Payer: BC Managed Care – PPO

## 2020-04-28 ENCOUNTER — Inpatient Hospital Stay
Admission: EM | Admit: 2020-04-28 | Discharge: 2020-04-30 | DRG: 690 | Disposition: A | Discharge status: Home / Self Care | Payer: BC Managed Care – PPO | Attending: Internal Medicine | Admitting: Internal Medicine

## 2020-04-28 ENCOUNTER — Other Ambulatory Visit: Payer: Self-pay

## 2020-04-28 DIAGNOSIS — N10 Acute tubulo-interstitial nephritis: Principal | ICD-10-CM | POA: Diagnosis present

## 2020-04-28 DIAGNOSIS — Z791 Long term (current) use of non-steroidal anti-inflammatories (NSAID): Secondary | ICD-10-CM | POA: Diagnosis not present

## 2020-04-28 DIAGNOSIS — Z20822 Contact with and (suspected) exposure to covid-19: Secondary | ICD-10-CM | POA: Diagnosis present

## 2020-04-28 DIAGNOSIS — Z881 Allergy status to other antibiotic agents status: Secondary | ICD-10-CM | POA: Diagnosis not present

## 2020-04-28 DIAGNOSIS — Z79899 Other long term (current) drug therapy: Secondary | ICD-10-CM

## 2020-04-28 DIAGNOSIS — N12 Tubulo-interstitial nephritis, not specified as acute or chronic: Secondary | ICD-10-CM

## 2020-04-28 DIAGNOSIS — F419 Anxiety disorder, unspecified: Secondary | ICD-10-CM | POA: Diagnosis present

## 2020-04-28 DIAGNOSIS — Z9884 Bariatric surgery status: Secondary | ICD-10-CM | POA: Diagnosis not present

## 2020-04-28 DIAGNOSIS — Z8659 Personal history of other mental and behavioral disorders: Secondary | ICD-10-CM

## 2020-04-28 DIAGNOSIS — F329 Major depressive disorder, single episode, unspecified: Secondary | ICD-10-CM | POA: Diagnosis present

## 2020-04-28 DIAGNOSIS — I10 Essential (primary) hypertension: Secondary | ICD-10-CM | POA: Diagnosis present

## 2020-04-28 DIAGNOSIS — A419 Sepsis, unspecified organism: Secondary | ICD-10-CM

## 2020-04-28 DIAGNOSIS — K219 Gastro-esophageal reflux disease without esophagitis: Secondary | ICD-10-CM | POA: Diagnosis present

## 2020-04-28 DIAGNOSIS — R6510 Systemic inflammatory response syndrome (SIRS) of non-infectious origin without acute organ dysfunction: Secondary | ICD-10-CM | POA: Diagnosis present

## 2020-04-28 DIAGNOSIS — M549 Dorsalgia, unspecified: Secondary | ICD-10-CM | POA: Diagnosis not present

## 2020-04-28 DIAGNOSIS — Z882 Allergy status to sulfonamides status: Secondary | ICD-10-CM

## 2020-04-28 LAB — URINALYSIS, ROUTINE W REFLEX MICROSCOPIC
Bilirubin Urine: NEGATIVE
Glucose, UA: NEGATIVE mg/dL
Ketones, ur: NEGATIVE mg/dL
Nitrite: POSITIVE — AB
Protein, ur: NEGATIVE mg/dL
Specific Gravity, Urine: 1.006 (ref 1.005–1.030)
pH: 6 (ref 5.0–8.0)

## 2020-04-28 LAB — CBC WITH DIFFERENTIAL/PLATELET
Abs Immature Granulocytes: 0.01 10*3/uL (ref 0.00–0.07)
Basophils Absolute: 0.1 10*3/uL (ref 0.0–0.1)
Basophils Relative: 1 %
Eosinophils Absolute: 0.2 10*3/uL (ref 0.0–0.5)
Eosinophils Relative: 3 %
HCT: 34.4 % — ABNORMAL LOW (ref 36.0–46.0)
Hemoglobin: 11 g/dL — ABNORMAL LOW (ref 12.0–15.0)
Immature Granulocytes: 0 %
Lymphocytes Relative: 26 %
Lymphs Abs: 1.4 10*3/uL (ref 0.7–4.0)
MCH: 28.1 pg (ref 26.0–34.0)
MCHC: 32 g/dL (ref 30.0–36.0)
MCV: 88 fL (ref 80.0–100.0)
Monocytes Absolute: 0.6 10*3/uL (ref 0.1–1.0)
Monocytes Relative: 10 %
Neutro Abs: 3.3 10*3/uL (ref 1.7–7.7)
Neutrophils Relative %: 60 %
Platelets: 263 10*3/uL (ref 150–400)
RBC: 3.91 MIL/uL (ref 3.87–5.11)
RDW: 13.8 % (ref 11.5–15.5)
WBC: 5.5 10*3/uL (ref 4.0–10.5)
nRBC: 0 % (ref 0.0–0.2)

## 2020-04-28 LAB — LACTIC ACID, PLASMA: Lactic Acid, Venous: 1.2 mmol/L (ref 0.5–1.9)

## 2020-04-28 LAB — COMPREHENSIVE METABOLIC PANEL
ALT: 17 U/L (ref 0–44)
AST: 18 U/L (ref 15–41)
Albumin: 3.7 g/dL (ref 3.5–5.0)
Alkaline Phosphatase: 72 U/L (ref 38–126)
Anion gap: 8 (ref 5–15)
BUN: 21 mg/dL — ABNORMAL HIGH (ref 6–20)
CO2: 26 mmol/L (ref 22–32)
Calcium: 8.5 mg/dL — ABNORMAL LOW (ref 8.9–10.3)
Chloride: 103 mmol/L (ref 98–111)
Creatinine, Ser: 0.91 mg/dL (ref 0.44–1.00)
GFR calc Af Amer: >60 mL/min (ref >60)
GFR calc non Af Amer: >60 mL/min (ref >60)
Glucose, Bld: 107 mg/dL — ABNORMAL HIGH (ref 70–99)
Potassium: 4.3 mmol/L (ref 3.5–5.1)
Sodium: 137 mmol/L (ref 135–145)
Total Bilirubin: 1 mg/dL (ref 0.3–1.2)
Total Protein: 6.5 g/dL (ref 6.5–8.1)

## 2020-04-28 LAB — SARS CORONAVIRUS 2 BY RT PCR (HOSPITAL ORDER, PERFORMED IN ~~LOC~~ HOSPITAL LAB): SARS Coronavirus 2: NEGATIVE

## 2020-04-28 LAB — TSH: TSH: 1.434 u[IU]/mL (ref 0.350–4.500)

## 2020-04-28 LAB — PROTIME-INR
INR: 1 (ref 0.8–1.2)
Prothrombin Time: 13.1 seconds (ref 11.4–15.2)

## 2020-04-28 LAB — T4, FREE: Free T4: 0.85 ng/dL (ref 0.61–1.12)

## 2020-04-28 LAB — APTT: aPTT: 29 seconds (ref 24–36)

## 2020-04-28 LAB — TROPONIN I (HIGH SENSITIVITY): Troponin I (High Sensitivity): 3 ng/L (ref <18)

## 2020-04-28 MED ORDER — SODIUM CHLORIDE 0.9 % IV BOLUS (SEPSIS): 1000.0000 mL | Freq: Once | INTRAVENOUS | Status: DC

## 2020-04-28 MED ORDER — NOREPINEPHRINE 4 MG/250ML-% IV SOLN
INTRAVENOUS | Status: AC
  Filled 2020-04-28: qty 250

## 2020-04-28 MED ORDER — ONDANSETRON HCL 4 MG/2ML IJ SOLN: 4.0000 mg | Freq: Four times a day (QID) | INTRAMUSCULAR | Status: DC | PRN

## 2020-04-28 MED ORDER — ONDANSETRON HCL 4 MG PO TABS: 4.0000 mg | ORAL_TABLET | Freq: Four times a day (QID) | ORAL | Status: DC | PRN

## 2020-04-28 MED ORDER — SODIUM CHLORIDE 0.9 % IV BOLUS (SEPSIS)
2000.0000 mL | Freq: Once | INTRAVENOUS | Status: AC
  Administered 2020-04-28: 2000 mL via INTRAVENOUS

## 2020-04-28 MED ORDER — PANTOPRAZOLE SODIUM 40 MG PO TBEC
40.0000 mg | DELAYED_RELEASE_TABLET | Freq: Every day | ORAL | Status: DC
  Administered 2020-04-28 – 2020-04-30 (×3): 40 mg via ORAL
  Filled 2020-04-28 (×3): qty 1

## 2020-04-28 MED ORDER — BUPROPION HCL ER (XL) 150 MG PO TB24
150.0000 mg | ORAL_TABLET | Freq: Every day | ORAL | Status: DC
  Administered 2020-04-29 – 2020-04-30 (×2): 150 mg via ORAL
  Filled 2020-04-28 (×2): qty 1

## 2020-04-28 MED ORDER — GABAPENTIN 100 MG PO CAPS
100.0000 mg | ORAL_CAPSULE | Freq: Two times a day (BID) | ORAL | Status: DC
  Administered 2020-04-28 – 2020-04-30 (×5): 100 mg via ORAL
  Filled 2020-04-28 (×6): qty 1

## 2020-04-28 MED ORDER — ACETAMINOPHEN 325 MG PO TABS
650.0000 mg | ORAL_TABLET | ORAL | Status: DC | PRN
  Administered 2020-04-28 – 2020-04-29 (×2): 650 mg via ORAL
  Filled 2020-04-28 (×2): qty 2

## 2020-04-28 MED ORDER — DULOXETINE HCL 30 MG PO CPEP
60.0000 mg | ORAL_CAPSULE | Freq: Every day | ORAL | Status: DC
  Administered 2020-04-29 – 2020-04-30 (×2): 60 mg via ORAL
  Filled 2020-04-28 (×2): qty 2

## 2020-04-28 MED ORDER — SODIUM CHLORIDE 0.9 % IV SOLN: INTRAVENOUS | Status: DC

## 2020-04-28 MED ORDER — SODIUM CHLORIDE 0.9 % IV BOLUS (SEPSIS)
500.0000 mL | Freq: Once | INTRAVENOUS | Status: AC
  Administered 2020-04-28: 500 mL via INTRAVENOUS

## 2020-04-28 MED ORDER — ZOLPIDEM TARTRATE 5 MG PO TABS
5.0000 mg | ORAL_TABLET | Freq: Every evening | ORAL | Status: DC | PRN
  Administered 2020-04-28 – 2020-04-29 (×2): 5 mg via ORAL
  Filled 2020-04-28 (×2): qty 1

## 2020-04-28 MED ORDER — SENNA 8.6 MG PO TABS
1.0000 | ORAL_TABLET | Freq: Every day | ORAL | Status: DC | PRN
  Administered 2020-04-28: 8.6 mg via ORAL
  Filled 2020-04-28: qty 1

## 2020-04-28 MED ORDER — CYCLOBENZAPRINE HCL 10 MG PO TABS
5.0000 mg | ORAL_TABLET | Freq: Two times a day (BID) | ORAL | Status: DC
  Administered 2020-04-28 – 2020-04-30 (×4): 5 mg via ORAL
  Filled 2020-04-28 (×4): qty 1

## 2020-04-28 MED ORDER — KETOROLAC TROMETHAMINE 30 MG/ML IJ SOLN
15.0000 mg | INTRAMUSCULAR | Status: AC
  Administered 2020-04-28: 15 mg via INTRAVENOUS
  Filled 2020-04-28: qty 1

## 2020-04-28 MED ORDER — LACTATED RINGERS IV SOLN: INTRAVENOUS | Status: DC

## 2020-04-28 MED ORDER — MELOXICAM 7.5 MG PO TABS
15.0000 mg | ORAL_TABLET | Freq: Every day | ORAL | Status: DC
  Administered 2020-04-28 – 2020-04-30 (×3): 15 mg via ORAL
  Filled 2020-04-28 (×4): qty 2

## 2020-04-28 MED ORDER — SODIUM CHLORIDE 0.9 % IV SOLN
1.0000 g | INTRAVENOUS | Status: DC
  Administered 2020-04-28 – 2020-04-29 (×2): 1 g via INTRAVENOUS
  Filled 2020-04-28 (×2): qty 10
  Filled 2020-04-28: qty 1

## 2020-04-28 MED ORDER — LINACLOTIDE 145 MCG PO CAPS
145.0000 ug | ORAL_CAPSULE | Freq: Every day | ORAL | Status: DC
  Administered 2020-04-29 – 2020-04-30 (×2): 145 ug via ORAL
  Filled 2020-04-28 (×2): qty 1

## 2020-04-28 MED ORDER — METHYLPREDNISOLONE SODIUM SUCC 125 MG IJ SOLR
80.0000 mg | INTRAMUSCULAR | Status: AC
  Administered 2020-04-28: 80 mg via INTRAVENOUS
  Filled 2020-04-28: qty 2

## 2020-04-28 MED ORDER — GABAPENTIN 300 MG PO CAPS
300.0000 mg | ORAL_CAPSULE | Freq: Every day | ORAL | Status: DC
  Administered 2020-04-28 – 2020-04-29 (×2): 300 mg via ORAL
  Filled 2020-04-28 (×2): qty 1

## 2020-04-28 MED ORDER — LACTATED RINGERS IV BOLUS
1000.0000 mL | Freq: Once | INTRAVENOUS | Status: AC
  Administered 2020-04-28: 1000 mL via INTRAVENOUS

## 2020-04-28 MED ORDER — MAGNESIUM HYDROXIDE 400 MG/5ML PO SUSP: 30.0000 mL | Freq: Every day | ORAL | Status: DC | PRN

## 2020-04-28 MED ORDER — BUSPIRONE HCL 15 MG PO TABS
7.5000 mg | ORAL_TABLET | Freq: Two times a day (BID) | ORAL | Status: DC
  Administered 2020-04-28 – 2020-04-30 (×4): 7.5 mg via ORAL
  Filled 2020-04-28 (×5): qty 1

## 2020-04-28 MED ORDER — SODIUM CHLORIDE 0.9 % IV SOLN
2.0000 g | Freq: Once | INTRAVENOUS | Status: AC
  Administered 2020-04-28: 2 g via INTRAVENOUS
  Filled 2020-04-28: qty 2

## 2020-04-28 MED ORDER — NOREPINEPHRINE 4 MG/250ML-% IV SOLN: 0.0000 ug/min | INTRAVENOUS | Status: DC

## 2020-04-28 MED ORDER — ENOXAPARIN SODIUM 40 MG/0.4ML ~~LOC~~ SOLN
40.0000 mg | SUBCUTANEOUS | Status: DC
  Administered 2020-04-28 – 2020-04-29 (×2): 40 mg via SUBCUTANEOUS
  Filled 2020-04-28 (×2): qty 0.4

## 2020-04-28 MED ORDER — TRAZODONE HCL 50 MG PO TABS: 25.0000 mg | ORAL_TABLET | Freq: Every evening | ORAL | Status: DC | PRN

## 2020-04-28 NOTE — ED Triage Notes (Signed)
Patient presents to the ED via EMS from [facility] for lower back pain, weakness and numbness.  At office, patient's blood pressure was 76/40 and with EMS, patient's bp was 68/40.  Patient appears very lethargic but is responding at this time.  Patient's cbg was 119 per EMS.  Per EMS patient was recently diagnosed with a kidney infection and has been on several antibiotics for it, just recently started cipro.  Patient's son died of a STEMI at 53 and anniversary of death is soon per EMS.  Patient has history of stress and anxiety.  Patient has been hypotensive in the past.

## 2020-04-28 NOTE — ED Notes (Signed)
This RN spoke with EDP regarding pt's BP, VORB to hold Levo, give 1L LR bolus and reassess BP after LR bolus due to patient's BP increasing and LOC increasing from arrival. Purewick placed on patient at this time by this RN, explained not wanting patient to get up due to BP. Pt states understanding. Pt noted to be more alert than on arrival.

## 2020-04-28 NOTE — Progress Notes (Signed)
CODE SEPSIS - PHARMACY COMMUNICATION  **Broad Spectrum Antibiotics should be administered within 1 hour of Sepsis diagnosis**  Time Code Sepsis Called/Page Received: [id number]  Antibiotics Ordered: Azactam  Time of 1st antibiotic administration: [id number]  Additional action taken by pharmacy: none  If necessary, Name of Provider/Nurse Contacted: n/a    Pearla Dubonnet ,PharmD Clinical Pharmacist  04/28/2020  1:54 PM

## 2020-04-28 NOTE — H&P (Signed)
History and Physical    Natasha Estrada [id number] DOB: 09/06/1971 DOA: 04/28/2020  PCP: Wayland Denis, PA-C   Patient coming from: Home  I have personally briefly reviewed patient's old medical records in [place]  Chief Complaint: Back pain  HPI: Natasha Estrada is a 49 y.o. female with medical history significant for GERD, nephrolithiasis, hypertension and recurrent UTI who was sent to the emergency room from the orthopedic physician's office for evaluation of low back pain mostly in the right flank with associated weakness and lightheadedness.  Patient was noted to have a blood pressure of 76/40 in the office and was sent to the emergency room.  She was recently started on ciprofloxacin for pyelonephritis. She denies having any chest pain, shortness of breath, nausea, vomiting or changes in her bowel habits.  She denies having any headaches. She received 3.5 L fluid bolus in the emergency room with improvement in her blood pressure. Chest x-ray showed clear lungs.  Patient with significant pyuria.  Lactate level is normal and she has a normal white blood cell count. She had a CT scan of abdomen and pelvis which showed nonobstructive bilateral nephrolithiasis. No evidence of ureteral calculus or hydronephrosis.   ED Course: Patient is a 49 year old female who presented to the emergency room in shock most likely from pyelonephritis.  Patient responded well to IV fluid resuscitation with improvement in her blood pressure.  She received a dose of IV Aztreonam.  In the emergency room for pyelonephritis.  She will be admitted to the hospital for further evaluation.  Review of Systems: As per HPI otherwise 10 point review of systems negative.    Past Medical History:  Diagnosis Date  . Anxiety   . Depression   . Fibroid   . GERD (gastroesophageal reflux disease)   . Hypertension   . Insomnia   . Kidney stone     Past Surgical History:  Procedure Laterality Date  .  GASTRIC ROUX-EN-Y    . ROTATOR CUFF REPAIR       reports that she has never smoked. She has never used smokeless tobacco. She reports previous alcohol use. She reports that she does not use drugs.  Allergies  Allergen Reactions  . Cefuroxime Shortness Of Breath    Tolerates Augmentin per husband Joe  . Sulfa Antibiotics Rash    No family history on file.   Prior to Admission medications   Medication Sig Start Date End Date Taking? Authorizing Provider  buPROPion (WELLBUTRIN XL) 150 MG 24 hr tablet  08/27/19  Yes [provider]  busPIRone (BUSPAR) 7.5 MG tablet TK 1 T PO BID 07/31/19  Yes [provider]  ciprofloxacin (CIPRO) 500 MG tablet Take 500 mg by mouth 2 (two) times daily. 04/25/20  Yes [provider]  DULoxetine (CYMBALTA) 60 MG capsule Take by mouth. 04/14/20 04/14/21 Yes [provider]  fexofenadine-pseudoephedrine (ALLEGRA-D) 60-120 MG 12 hr tablet Take 1 tablet by mouth 2 (two) times daily. 10/06/19  Yes Sable Feil, PA-C  levonorgestrel [facility]) 20 MCG/24HR IUD by Intrauterine route.   Yes [provider]  losartan (COZAAR) 25 MG tablet TK 1 T PO QD 08/26/19  Yes [provider]  omeprazole (PRILOSEC) 20 MG capsule Take by mouth. 09/07/15 04/28/20 Yes [provider]  propranolol (INDERAL) 40 MG tablet TAKE 1 TABLET(40 MG) BY MOUTH TWICE DAILY AS NEEDED. PATIENT TAKE AS NEEDED 05/26/19  Yes [provider]  cyclobenzaprine (FLEXERIL) 10 MG tablet TK 1 T PO BID  UTD 09/03/19   [provider]  gabapentin (NEURONTIN) 100 MG capsule TK 1 C PO BID 09/03/19   [provider]  gabapentin (NEURONTIN) 300 MG capsule TK 2 CS PO QD HS 09/03/19   [provider]  ibuprofen (ADVIL) 600 MG tablet Take 1 tablet (600 mg total) by mouth every 8 (eight) hours as needed. 10/06/19   Sable Feil, PA-C  linaclotide [facility]) 145 MCG CAPS capsule Take by mouth. 12/23/18   [provider]    meloxicam (MOBIC) 15 MG tablet TK 1 T PO D. 08/07/19   [provider]    Physical Exam: Vitals:   04/28/20 1040 04/28/20 1045 04/28/20 1050 04/28/20 1055  BP: (!) 82/57 (!) 85/60 (!) 83/57 (!) 88/58  Pulse: 60 62 61 62  Resp: 13 13 13 17   Temp:      TempSrc:      SpO2: 99% 100% 99% 100%  Weight:      Height:         Vitals:   04/28/20 1040 04/28/20 1045 04/28/20 1050 04/28/20 1055  BP: (!) 82/57 (!) 85/60 (!) 83/57 (!) 88/58  Pulse: 60 62 61 62  Resp: 13 13 13 17   Temp:      TempSrc:      SpO2: 99% 100% 99% 100%  Weight:      Height:        Constitutional: NAD, alert and oriented x 3 Eyes: PERRL, lids and conjunctivae normal ENMT: Mucous membranes are moist.  Neck: normal, supple, no masses, no thyromegaly Respiratory: clear to auscultation bilaterally, no wheezing, no crackles. Normal respiratory effort. No accessory muscle use.  Cardiovascular: Regular rate and rhythm, no murmurs / rubs / gallops. No extremity edema. 2+ pedal pulses. No carotid bruits.  Abdomen:  Rt CVA tenderness, no masses palpated. No hepatosplenomegaly. Bowel sounds positive.  Musculoskeletal: no clubbing / cyanosis. No joint deformity upper and lower extremities.  Skin: no rashes, lesions, ulcers.  Neurologic: No gross focal neurologic deficit. Psychiatric: Normal mood and affect.   Labs on Admission: I have personally reviewed following labs and imaging studies  CBC: Recent Labs  Lab 04/28/20 0920  WBC 5.5  NEUTROABS 3.3  HGB 11.0*  HCT 34.4*  MCV 88.0  PLT 99991111   Basic Metabolic Panel: Recent Labs  Lab 04/28/20 0920  NA 137  K 4.3  CL 103  CO2 26  GLUCOSE 107*  BUN 21*  CREATININE 0.91  CALCIUM 8.5*   GFR: Estimated Creatinine Clearance: 75 mL/min (by C-G formula based on SCr of 0.91 mg/dL). Liver Function Tests: Recent Labs  Lab 04/28/20 0920  AST 18  ALT 17  ALKPHOS 72  BILITOT 1.0  PROT 6.5  ALBUMIN 3.7   No results for input(s): LIPASE, AMYLASE in  the last 168 hours. No results for input(s): AMMONIA in the last 168 hours. Coagulation Profile: Recent Labs  Lab 04/28/20 0920  INR 1.0   Cardiac Enzymes: No results for input(s): CKTOTAL, CKMB, CKMBINDEX, TROPONINI in the last 168 hours. BNP (last 3 results) No results for input(s): PROBNP in the last 8760 hours. HbA1C: No results for input(s): HGBA1C in the last 72 hours. CBG: No results for input(s): GLUCAP in the last 168 hours. Lipid Profile: No results for input(s): CHOL, HDL, LDLCALC, TRIG, CHOLHDL, LDLDIRECT in the last 72 hours. Thyroid Function Tests: Recent Labs    04/28/20 0920  TSH 1.434  FREET4 0.85   Anemia Panel: No results for input(s): VITAMINB12, FOLATE,  FERRITIN, TIBC, IRON, RETICCTPCT in the last 72 hours. Urine analysis:    Component Value Date/Time   COLORURINE YELLOW (A) 04/28/2020 1213   APPEARANCEUR HAZY (A) 04/28/2020 1213   APPEARANCEUR Hazy (A) 09/12/2019 1048   LABSPEC 1.006 04/28/2020 1213   PHURINE 6.0 04/28/2020 1213   GLUCOSEU NEGATIVE 04/28/2020 1213   HGBUR SMALL (A) 04/28/2020 1213   BILIRUBINUR NEGATIVE 04/28/2020 1213   BILIRUBINUR negative 10/31/2019 1323   BILIRUBINUR Negative 09/12/2019 1048   KETONESUR NEGATIVE 04/28/2020 1213   PROTEINUR NEGATIVE 04/28/2020 1213   UROBILINOGEN 0.2 10/31/2019 1323   NITRITE POSITIVE (A) 04/28/2020 1213   LEUKOCYTESUR MODERATE (A) 04/28/2020 1213    Radiological Exams on Admission: DG Chest Port 1 View  Result Date: 04/28/2020 CLINICAL DATA:  Hypotension and weakness EXAM: PORTABLE CHEST 1 VIEW COMPARISON:  October 06, 2019 FINDINGS: The lungs are clear. The heart size and pulmonary vascularity are normal. No adenopathy. Postoperative changes noted in the left humeral head region. IMPRESSION: Lungs clear.  Cardiac silhouette within normal limits. Electronically Signed   By: Lowella Grip III M.D.   On: 04/28/2020 09:42   CT Renal Stone Study  Result Date: 04/28/2020 CLINICAL DATA:   Flank pain, low back pain, lethargy EXAM: CT ABDOMEN AND PELVIS WITHOUT CONTRAST TECHNIQUE: Multidetector CT imaging of the abdomen and pelvis was performed following the standard protocol without IV contrast. COMPARISON:  12/16/2018 FINDINGS: Lower chest: No acute abnormality. Hepatobiliary: No focal liver abnormality is seen. Status post cholecystectomy. No biliary dilatation. Pancreas: Unremarkable. No pancreatic ductal dilatation or surrounding inflammatory changes. Spleen: Normal in size without significant abnormality. Adrenals/Urinary Tract: Adrenal glands are unremarkable. Nonobstructive calculi of the superior pole of the left kidney. Punctuate nonobstructive calculus of the midportion of the right kidney. No hydronephrosis. Capsular calcifications about the lateral midportion of the right kidney, unchanged compared to prior examination. Bladder is unremarkable. Stomach/Bowel: Status post Roux-en-Y gastric bypass. Appendix appears normal. No evidence of bowel wall thickening, distention, or inflammatory changes. Vascular/Lymphatic: No significant vascular findings are present. No enlarged abdominal or pelvic lymph nodes. Reproductive: IUD present in the endometrial cavity. Other: No abdominal wall hernia or abnormality. No abdominopelvic ascites. Musculoskeletal: No acute or significant osseous findings. IMPRESSION: 1. Nonobstructive bilateral nephrolithiasis. No evidence of ureteral calculus or hydronephrosis. 2. No acute noncontrast CT findings of the abdomen or pelvis. 3. Status post Roux-en-Y gastric bypass. 4. IUD present in the endometrial cavity. Electronically Signed   By: Eddie Candle M.D.   On: 04/28/2020 10:08    EKG: Independently reviewed.   Assessment/Plan Principal Problem:   Acute pyelonephritis Active Problems:   Anxiety   Gastroesophageal reflux disease without esophagitis   History of depression    Acute pyelonephritis Patient presents to the emergency room for evaluation of  right flank pain and pyuria She was in shock but responded to IV fluid resuscitation Previous urine culture yielded E. Coli We will place patient on Rocephin 1g IV daily Follow-up results of blood and urine culture   Depression Continue Wellbutrin, buspirone and Cymbalta   Hypertension Hold all antihypertensive medications since patient was hypotensive upon arrival with improvement in her blood pressure following IV fluid resuscitation     DVT prophylaxis:  Lovenox Code Status: Full Family Communication: Greater than 50% of time was spent discussing patient's condition and plan of care with her at the bedside.  All questions and concerns have been addressed.  She verbalizes understanding and agrees with the plan. Disposition Plan: Back to previous home  environment Consults called: None    Vernetta Dizdarevic MD [facility]     04/28/2020, 1:11 PM

## 2020-04-28 NOTE — ED Provider Notes (Signed)
[facility] Emergency Department Provider Note  ____________________________________________  Time seen: Approximately 9:55 AM  I have reviewed the triage vital signs and the nursing notes.   HISTORY  Chief Complaint Back Pain and Hypotension    HPI Natasha Estrada is a 49 y.o. female with a history of depression GERD hypertension and recurrent UTI who comes the ED due to low back pain, right flank pain, weakness and lightheadedness.  She was initially seen in Ortho clinic this morning and found to have a blood pressure of 76/40 and sent to the ED.   She notes that she was recently started on Cipro.  Symptoms are constant, ongoing since yesterday gradual onset, no aggravating or alleviating factors.  EMR reviewed, shows she had an outpatient visit with PCP 3 days ago, started on Cipro for right pyelonephritis. Patient has severe allergy to cefuroxime, allergy to Bactrim.  Prior urine culture result shows E. coli infection with resistance to Cipro and penicillin.  Patient denies any sedative ingestions.  She is prescribed a muscle relaxer which she has not taken in 2 days, also has a sleep aid that she has not taken since last night.  Patient states she has been on antibiotics for the past 6 weeks for UTI   Past Medical History:  Diagnosis Date  . Anxiety   . Depression   . Fibroid   . GERD (gastroesophageal reflux disease)   . Hypertension   . Insomnia   . Kidney stone      Patient Active Problem List   Diagnosis Date Noted  . Insomnia 09/12/2019  . Menorrhagia with regular cycle 09/12/2019  . Nephrolithiasis 09/12/2019  . Recurrent UTI 09/12/2019  . Nontraumatic incomplete tear of left rotator cuff 08/29/2019  . Rotator cuff tendinitis, left 08/29/2019  . Status post left rotator cuff repair 08/29/2019  . Gastroesophageal reflux disease without esophagitis 10/11/2017  . History of depression 10/11/2017  . History of Roux-en-Y gastric bypass  10/11/2017  . 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 Dysmenorrhea 04/17/2014  . Anxiety 04/14/2013     Past Surgical History:  Procedure Laterality Date  . GASTRIC ROUX-EN-Y    . ROTATOR CUFF REPAIR       Prior to Admission medications   Medication Sig Start Date End Date Taking? Authorizing Provider  buPROPion (WELLBUTRIN XL) 150 MG 24 hr tablet  08/27/19  Yes [provider]  busPIRone (BUSPAR) 7.5 MG tablet TK 1 T PO BID 07/31/19  Yes [provider]  ciprofloxacin (CIPRO) 500 MG tablet Take 500 mg by mouth 2 (two) times daily. 04/25/20  Yes [provider]  DULoxetine (CYMBALTA) 60 MG capsule Take by mouth. 04/14/20 04/14/21 Yes [provider]  fexofenadine-pseudoephedrine (ALLEGRA-D) 60-120 MG 12 hr tablet Take 1 tablet by mouth 2 (two) times daily. 10/06/19  Yes Sable Feil, PA-C  levonorgestrel [facility]) 20 MCG/24HR IUD by Intrauterine route.   Yes [provider]  losartan (COZAAR) 25 MG tablet TK 1 T PO QD 08/26/19  Yes [provider]  omeprazole (PRILOSEC) 20 MG capsule Take by mouth. 09/07/15 04/28/20 Yes [provider]  propranolol (INDERAL) 40 MG tablet TAKE 1 TABLET(40 MG) BY MOUTH TWICE DAILY AS NEEDED. PATIENT TAKE AS NEEDED 05/26/19  Yes [provider]  cyclobenzaprine (FLEXERIL) 10 MG tablet TK 1 T PO BID UTD 09/03/19   [provider]  gabapentin (NEURONTIN) 100 MG capsule TK 1 C PO BID 09/03/19   [provider]  gabapentin (NEURONTIN)  300 MG capsule TK 2 CS PO QD HS 09/03/19   [provider]  ibuprofen (ADVIL) 600 MG tablet Take 1 tablet (600 mg total) by mouth every 8 (eight) hours as needed. 10/06/19   Sable Feil, PA-C  linaclotide [facility]) 145 MCG CAPS capsule Take by mouth. 12/23/18   [provider]  meloxicam (MOBIC) 15 MG tablet TK 1 T PO D. 08/07/19   [provider]     Allergies Cefuroxime and Sulfa antibiotics   No family history  on file.  Social History Social History   Tobacco Use  . Smoking status: Never Smoker  . Smokeless tobacco: Never Used  Substance Use Topics  . Alcohol use: Not Currently  . Drug use: Never    Review of Systems  Constitutional:   No fever or chills.  ENT:   No sore throat. No rhinorrhea. Cardiovascular:   No chest pain or syncope. Respiratory:   No dyspnea or cough. Gastrointestinal:   Negative for abdominal pain, vomiting and diarrhea.  Musculoskeletal:   Positive back and right flank pain All other systems reviewed and are negative except as documented above in ROS and HPI.  ____________________________________________   PHYSICAL EXAM:  VITAL SIGNS: ED Triage Vitals  Enc Vitals Group     BP 04/28/20 0905 (!) 45/35     Pulse Rate 04/28/20 0905 68     Resp 04/28/20 0905 18     Temp 04/28/20 0905 98.1 F (36.7 C)     Temp Source 04/28/20 0905 Oral     SpO2 04/28/20 0905 100 %     Weight 04/28/20 0935 169 lb 15.6 oz (77.1 kg)     Height 04/28/20 0935 5\' 5"  (1.651 m)     Head Circumference --      Peak Flow --      Pain Score 04/28/20 0935 8     Pain Loc --      Pain Edu? --      Excl. in [place]? --     Vital signs reviewed, nursing assessments reviewed.   Constitutional:   Alert and oriented. Ill-appearing Eyes:   Conjunctivae are normal. EOMI. PERRL. ENT      Head:   Normocephalic and atraumatic.      Nose:   Wearing a mask.      Mouth/Throat:   Wearing a mask.      Neck:   No meningismus. Full ROM. Hematological/Lymphatic/Immunilogical:   No cervical lymphadenopathy. Cardiovascular:   RRR. Symmetric bilateral radial and DP pulses.  No murmurs. Cap refill less than 2 seconds. Respiratory:   Normal respiratory effort without tachypnea/retractions. Breath sounds are clear and equal bilaterally. No wheezes/rales/rhonchi. Gastrointestinal:   Soft with suprapubic tenderness. Non distended. There is no CVA tenderness.  No rebound, rigidity, or  guarding.  Musculoskeletal:   Normal range of motion in all extremities. No joint effusions.  No lower extremity tenderness.  No edema. Neurologic:   Normal speech and language.  Motor grossly intact. No acute focal neurologic deficits are appreciated.  Skin:    Skin is warm, dry and intact. No rash noted.  No petechiae, purpura, or bullae.  ____________________________________________    LABS (pertinent positives/negatives) (all labs ordered are listed, but only abnormal results are displayed) Labs Reviewed  COMPREHENSIVE METABOLIC PANEL - Abnormal; Notable for the following components:      Result Value   Glucose, Bld 107 (*)    BUN 21 (*)    Calcium 8.5 (*)  All other components within normal limits  CBC WITH DIFFERENTIAL/PLATELET - Abnormal; Notable for the following components:   Hemoglobin 11.0 (*)    HCT 34.4 (*)    All other components within normal limits  URINALYSIS, ROUTINE W REFLEX MICROSCOPIC - Abnormal; Notable for the following components:   Color, Urine YELLOW (*)    APPearance HAZY (*)    Hgb urine dipstick SMALL (*)    Nitrite POSITIVE (*)    Leukocytes,Ua MODERATE (*)    Bacteria, UA MANY (*)    All other components within normal limits  CULTURE, BLOOD (ROUTINE X 2)  CULTURE, BLOOD (ROUTINE X 2)  URINE CULTURE  LACTIC ACID, PLASMA  APTT  PROTIME-INR  TSH  T4, FREE  POC URINE PREG, ED  TROPONIN I (HIGH SENSITIVITY)   ____________________________________________   EKG    ____________________________________________    RADIOLOGY  DG Chest Port 1 View  Result Date: 04/28/2020 CLINICAL DATA:  Hypotension and weakness EXAM: PORTABLE CHEST 1 VIEW COMPARISON:  October 06, 2019 FINDINGS: The lungs are clear. The heart size and pulmonary vascularity are normal. No adenopathy. Postoperative changes noted in the left humeral head region. IMPRESSION: Lungs clear.  Cardiac silhouette within normal limits. Electronically Signed   By: Lowella Grip  III M.D.   On: 04/28/2020 09:42   CT Renal Stone Study  Result Date: 04/28/2020 CLINICAL DATA:  Flank pain, low back pain, lethargy EXAM: CT ABDOMEN AND PELVIS WITHOUT CONTRAST TECHNIQUE: Multidetector CT imaging of the abdomen and pelvis was performed following the standard protocol without IV contrast. COMPARISON:  12/16/2018 FINDINGS: Lower chest: No acute abnormality. Hepatobiliary: No focal liver abnormality is seen. Status post cholecystectomy. No biliary dilatation. Pancreas: Unremarkable. No pancreatic ductal dilatation or surrounding inflammatory changes. Spleen: Normal in size without significant abnormality. Adrenals/Urinary Tract: Adrenal glands are unremarkable. Nonobstructive calculi of the superior pole of the left kidney. Punctuate nonobstructive calculus of the midportion of the right kidney. No hydronephrosis. Capsular calcifications about the lateral midportion of the right kidney, unchanged compared to prior examination. Bladder is unremarkable. Stomach/Bowel: Status post Roux-en-Y gastric bypass. Appendix appears normal. No evidence of bowel wall thickening, distention, or inflammatory changes. Vascular/Lymphatic: No significant vascular findings are present. No enlarged abdominal or pelvic lymph nodes. Reproductive: IUD present in the endometrial cavity. Other: No abdominal wall hernia or abnormality. No abdominopelvic ascites. Musculoskeletal: No acute or significant osseous findings. IMPRESSION: 1. Nonobstructive bilateral nephrolithiasis. No evidence of ureteral calculus or hydronephrosis. 2. No acute noncontrast CT findings of the abdomen or pelvis. 3. Status post Roux-en-Y gastric bypass. 4. IUD present in the endometrial cavity. Electronically Signed   By: Eddie Candle M.D.   On: 04/28/2020 10:08    ____________________________________________   PROCEDURES .Critical Care Performed by: Carrie Mew, MD Authorized by: Carrie Mew, MD   Critical care provider  statement:    Critical care time (minutes):  35   Critical care time was exclusive of:  Separately billable procedures and treating other patients   Critical care was necessary to treat or prevent imminent or life-threatening deterioration of the following conditions:  Sepsis, shock and circulatory failure   Critical care was time spent personally by me on the following activities:  Development of treatment plan with patient or surrogate, discussions with consultants, evaluation of patient's response to treatment, examination of patient, obtaining history from patient or surrogate, ordering and performing treatments and interventions, ordering and review of laboratory studies, ordering and review of radiographic studies, pulse oximetry, re-evaluation of patient's  condition and review of old charts    ____________________________________________  DIFFERENTIAL DIAGNOSIS   UTI/pyelonephritis, sepsis, AAA, diverticulitis, dehydration, ureterolithiasis with obstruction  CLINICAL IMPRESSION / ASSESSMENT AND PLAN / ED COURSE  Medications ordered in the ED: Medications  norepinephrine (LEVOPHED) 4mg  in 284mL premix infusion (0 mcg/min Intravenous Hold 04/28/20 1101)  lactated ringers infusion ( Intravenous New Bag/Given 04/28/20 1230)  sodium chloride 0.9 % bolus 2,000 mL (0 mLs Intravenous Stopped 04/28/20 1026)  sodium chloride 0.9 % bolus 500 mL (0 mLs Intravenous Stopped 04/28/20 1026)  aztreonam (AZACTAM) 2 g in sodium chloride 0.9 % 100 mL IVPB (0 g Intravenous Stopped 04/28/20 1026)  methylPREDNISolone sodium succinate (SOLU-MEDROL) 125 mg/2 mL injection 80 mg (80 mg Intravenous Given 04/28/20 1000)  lactated ringers bolus 1,000 mL (0 mLs Intravenous Stopped 04/28/20 1243)  ketorolac (TORADOL) 30 MG/ML injection 15 mg (15 mg Intravenous Given 04/28/20 1231)    Pertinent labs & imaging results that were available during my care of the patient were reviewed by me and considered in my medical  decision making (see chart for details).  Natasha Estrada was evaluated in Emergency Department on 04/28/2020 for the symptoms described in the history of present illness. She was evaluated in the context of the global COVID-19 pandemic, which necessitated consideration that the patient might be at risk for infection with the SARS-CoV-2 virus that causes COVID-19. Institutional protocols and algorithms that pertain to the evaluation of patients at risk for COVID-19 are in a state of rapid change based on information released by regulatory bodies including the CDC and federal and state organizations. These policies and algorithms were followed during the patient's care in the ED.     Clinical Course as of Apr 28 1252  Wed Apr 28, 2020  0918 Patient presents with weakness and hypotension, unclear cause.  She reports recurrent UTIs, and was recently started on treatment for right kidney pyelonephritis 3 days ago.  She has right flank pain and suprapubic tenderness, concerning for worsening infection resulting in hypotension.  Her propranolol use may be masking tachycardia and causing more pronounced circulatory dysfunction.  We will start aztreonam due to multiple antibiotic resistance of E. coli infection in the past and significant allergy to cephalosporins.   [PS]  1016 Ct a/p unremarkable   [PS]  1038 Sepsis reassessment completed after 2.5L NS bolus. BP still 88/60, MAP about 68. Will give additional 1 liter LR bolus, hold on vasopressors due to fluid responsiveness and improvement of symptoms as continued fluid resuscitation will be more appropriate and beneficial for patient and lactate was normal. Trop normal.   [PS]  1217 BP 106/66, MAP 75. Pt has maintained MAP 75-90 for the past 30 minutes after additional LR bolus. Vasopressors not indicated, will continue hemodynamic monitoring. F/u ua.   [PS]  [id number] UA c/w persistent UTI and pyelonephritis. TFT normal. Will consult hospitalist.   Urinalysis,  Routine w reflex microscopic(!) [PS]  1252 D/w hospitalist Dr. Francine Graven   [PS]    Clinical Course User Index [PS] Carrie Mew, MD     ____________________________________________   FINAL CLINICAL IMPRESSION(S) / ED DIAGNOSES    Final diagnoses:  Pyelonephritis  Septic shock [facility])     ED Discharge Orders    None      Portions of this note were generated with dragon dictation software. Dictation errors may occur despite best attempts at proofreading.   Carrie Mew, MD 04/28/20 1253

## 2020-04-29 DIAGNOSIS — Z8659 Personal history of other mental and behavioral disorders: Secondary | ICD-10-CM

## 2020-04-29 LAB — CBC
HCT: 29.7 % — ABNORMAL LOW (ref 36.0–46.0)
Hemoglobin: 9.7 g/dL — ABNORMAL LOW (ref 12.0–15.0)
MCH: 28 pg (ref 26.0–34.0)
MCHC: 32.7 g/dL (ref 30.0–36.0)
MCV: 85.6 fL (ref 80.0–100.0)
Platelets: 249 10*3/uL (ref 150–400)
RBC: 3.47 MIL/uL — ABNORMAL LOW (ref 3.87–5.11)
RDW: 14 % (ref 11.5–15.5)
WBC: 12 10*3/uL — ABNORMAL HIGH (ref 4.0–10.5)
nRBC: 0 % (ref 0.0–0.2)

## 2020-04-29 LAB — BASIC METABOLIC PANEL
Anion gap: 3 — ABNORMAL LOW (ref 5–15)
BUN: 17 mg/dL (ref 6–20)
CO2: 24 mmol/L (ref 22–32)
Calcium: 8.5 mg/dL — ABNORMAL LOW (ref 8.9–10.3)
Chloride: 115 mmol/L — ABNORMAL HIGH (ref 98–111)
Creatinine, Ser: 0.73 mg/dL (ref 0.44–1.00)
GFR calc Af Amer: >60 mL/min (ref >60)
GFR calc non Af Amer: >60 mL/min (ref >60)
Glucose, Bld: 138 mg/dL — ABNORMAL HIGH (ref 70–99)
Potassium: 4 mmol/L (ref 3.5–5.1)
Sodium: 142 mmol/L (ref 135–145)

## 2020-04-29 LAB — URINE CULTURE: Culture: NO GROWTH

## 2020-04-29 LAB — HIV ANTIBODY (ROUTINE TESTING W REFLEX): HIV Screen 4th Generation wRfx: NONREACTIVE

## 2020-04-29 NOTE — Plan of Care (Signed)

## 2020-04-29 NOTE — Progress Notes (Signed)
PROGRESS NOTE    Natasha Estrada  [id number] DOB: 30-Jan-1971 DOA: 04/28/2020 PCP: Wayland Denis, PA-C  Chief Complaint  Patient presents with  . Back Pain  . Hypotension    Brief Narrative:  49 year old female with history of nephrolithiasis, recurrent UTIs, hypertension and GERD presented to the ED from the orthopedics office where she was being evaluated for cervical neck pain.  Patient has been complaining of low back pain mainly in the right flank for past few days and having generalized weakness with lightheadedness.  She was prescribed ciprofloxacin for his back at an urgent care for UTI. In the orthopedics office she was found to be hypertensive with blood pressure 76/40 mmHg.  Patient given 2.5 L fluid bolus with improvement in her blood pressure.  UA suggestive of UTI with significant pyuria.  CT of the abdomen pelvis showed nonobstructive bilateral nephrolithiasis.  She received a dose of IV aztreonam in the ED and admitted for acute pyelonephritis.  Assessment & Plan:   Principal Problem:   Acute pyelonephritis On IV Rocephin.  Prior urine culture growing pansensitive E. coli.  Follow urine and blood culture.  Blood pressure currently stable.  Continue IV fluids.  No signs of sepsis.  Active Problems:   Anxiety and depression Continue Wellbutrin, buspirone and Cymbalta  Essential hypertension All blood pressure meds held due to hypotension  GERD Continue PPI   DVT prophylaxis: Subcu Lovenox Code Status: Full code Family Communication: None at bedside Disposition:   Status is: Inpatient  Remains inpatient appropriate because:IV treatments appropriate due to intensity of illness or inability to take PO.  Continues to need 1 more day of IV antibiotic and IV hydration with slowly improving symptoms.   Dispo: The patient is from: Home              Anticipated d/c is to: Home              Anticipated d/c date is: 1 day              Patient currently is not  medically stable to d/c.        Consultants:   None  Procedures: CT abdomen    Antimicrobials: IV Rocephin  Subjective: Seen and examined.  Complains of being weak.  No nausea vomiting.  No low back pain this morning.  Objective: Vitals:   04/28/20 1949 04/29/20 0207 04/29/20 0756 04/29/20 1154  BP: 119/76 119/81 117/70 123/79  Pulse: 79 80 83 86  Resp: 20 19 17 17   Temp: 98.1 F (36.7 C) 97.8 F (36.6 C) 97.7 F (36.5 C) 97.9 F (36.6 C)  TempSrc: Oral Oral    SpO2: 97% 97% 100% 97%  Weight:  88 kg    Height:        Intake/Output Summary (Last 24 hours) at 04/29/2020 1447 Last data filed at 04/29/2020 1400 Gross per 24 hour  Intake 2242.26 ml  Output 1100 ml  Net 1142.26 ml   Filed Weights   04/28/20 0935 04/28/20 1759 04/29/20 0207  Weight: 77.1 kg 87 kg 88 kg    Examination:  General: Middle-aged female not in distress, fatigued HEENT: Moist mucosa, supple neck Chest: Clear CVs: Normal S1-S2 GI: Soft, nondistended, nontender, no CVA tenderness Musculoskeletal: Warm, no edema   Data Reviewed: I have personally reviewed following labs and imaging studies  CBC: Recent Labs  Lab 04/28/20 0920 04/29/20 0559  WBC 5.5 12.0*  NEUTROABS 3.3  --   HGB 11.0* 9.7*  HCT 34.4*  29.7*  MCV 88.0 85.6  PLT 263 0000000    Basic Metabolic Panel: Recent Labs  Lab 04/28/20 0920 04/29/20 0559  NA 137 142  K 4.3 4.0  CL 103 115*  CO2 26 24  GLUCOSE 107* 138*  BUN 21* 17  CREATININE 0.91 0.73  CALCIUM 8.5* 8.5*    GFR: Estimated Creatinine Clearance: 91.1 mL/min (by C-G formula based on SCr of 0.73 mg/dL).  Liver Function Tests: Recent Labs  Lab 04/28/20 0920  AST 18  ALT 17  ALKPHOS 72  BILITOT 1.0  PROT 6.5  ALBUMIN 3.7    CBG: No results for input(s): GLUCAP in the last 168 hours.   Recent Results (from the past 240 hour(s))  Blood Culture (routine x 2)     Status: None (Preliminary result)   Collection Time: 04/28/20  9:20 AM    Specimen: BLOOD  Result Value Ref Range Status   Specimen Description BLOOD LEFT WRIST  Final   Special Requests   Final    BOTTLES DRAWN AEROBIC AND ANAEROBIC Blood Culture adequate volume   Culture   Final    NO GROWTH < 24 HOURS Performed at [facility], [address] [place], [place] [address]    Report Status PENDING  Incomplete  Blood Culture (routine x 2)     Status: None (Preliminary result)   Collection Time: 04/28/20  9:20 AM   Specimen: BLOOD  Result Value Ref Range Status   Specimen Description BLOOD RIGHT AC  Final   Special Requests   Final    BOTTLES DRAWN AEROBIC AND ANAEROBIC Blood Culture adequate volume   Culture   Final    NO GROWTH < 24 HOURS Performed at [facility], [address], [place], [place] [address]    Report Status PENDING  Incomplete  Urine culture     Status: None   Collection Time: 04/28/20 12:13 PM   Specimen: In/Out Cath Urine  Result Value Ref Range Status   Specimen Description   Final    IN/OUT CATH URINE Performed at [facility], [address], [place], [place] [address]    Special Requests   Final    NONE Performed at [facility], [address] [place], [place] [address]    Culture   Final    NO GROWTH Performed at [facility], [place] [address], [place],  [address]    Report Status 04/29/2020 FINAL  Final  SARS Coronavirus 2 by RT PCR (hospital order, performed in [facility] hospital lab) Nasopharyngeal Nasopharyngeal Swab     Status: None   Collection Time: 04/28/20  6:49 PM   Specimen: Nasopharyngeal Swab  Result Value Ref Range Status   SARS Coronavirus 2 NEGATIVE NEGATIVE Final    Comment: (NOTE) SARS-CoV-2 target nucleic acids are NOT DETECTED. The SARS-CoV-2 RNA is generally detectable in upper and lower respiratory specimens during the acute phase of infection. The lowest concentration of SARS-CoV-2 viral copies this assay can detect is 250 copies / mL. A  negative result does not preclude SARS-CoV-2 infection and should not be used as the sole basis for treatment or other patient management decisions.  A negative result may occur with improper specimen collection / handling, submission of specimen other than nasopharyngeal swab, presence of viral mutation(s) within the areas targeted by this assay, and inadequate number of viral copies (<250 copies / mL). A negative result must be combined with clinical observations, patient history, and epidemiological information.  Fact Sheet for Patients:   StrictlyIdeas.no Fact Sheet for Healthcare Providers: BankingDealers.co.za This test is not yet approved or cleared  by the [place] FDA and has been authorized for detection and/or diagnosis of SARS-CoV-2 by FDA under an Emergency Use Authorization (EUA).  This EUA will remain in effect (meaning this test can be used) for the duration of the COVID-19 declaration under Section 564(b)(1) of the Act, 21 U.S.C. section 360bbb-3(b)(1), unless the authorization is terminated or revoked sooner. Performed at [facility], [address] [place], [place] [address]          Radiology Studies: [facility] [place] 1 View  Result Date: 04/28/2020 CLINICAL DATA:  Hypotension and weakness EXAM: PORTABLE CHEST 1 VIEW COMPARISON:  October 06, 2019 FINDINGS: The lungs are clear. The heart size and pulmonary vascularity are normal. No adenopathy. Postoperative changes noted in the left humeral head region. IMPRESSION: Lungs clear.  Cardiac silhouette within normal limits. Electronically Signed   By: Lowella Grip III M.D.   On: 04/28/2020 09:42   CT Renal Stone Study  Result Date: 04/28/2020 CLINICAL DATA:  Flank pain, low back pain, lethargy EXAM: CT ABDOMEN AND PELVIS WITHOUT CONTRAST TECHNIQUE: Multidetector CT imaging of the abdomen and pelvis was performed following the standard protocol without IV  contrast. COMPARISON:  12/16/2018 FINDINGS: Lower chest: No acute abnormality. Hepatobiliary: No focal liver abnormality is seen. Status post cholecystectomy. No biliary dilatation. Pancreas: Unremarkable. No pancreatic ductal dilatation or surrounding inflammatory changes. Spleen: Normal in size without significant abnormality. Adrenals/Urinary Tract: Adrenal glands are unremarkable. Nonobstructive calculi of the superior pole of the left kidney. Punctuate nonobstructive calculus of the midportion of the right kidney. No hydronephrosis. Capsular calcifications about the lateral midportion of the right kidney, unchanged compared to prior examination. Bladder is unremarkable. Stomach/Bowel: Status post Roux-en-Y gastric bypass. Appendix appears normal. No evidence of bowel wall thickening, distention, or inflammatory changes. Vascular/Lymphatic: No significant vascular findings are present. No enlarged abdominal or pelvic lymph nodes. Reproductive: IUD present in the endometrial cavity. Other: No abdominal wall hernia or abnormality. No abdominopelvic ascites. Musculoskeletal: No acute or significant osseous findings. IMPRESSION: 1. Nonobstructive bilateral nephrolithiasis. No evidence of ureteral calculus or hydronephrosis. 2. No acute noncontrast CT findings of the abdomen or pelvis. 3. Status post Roux-en-Y gastric bypass. 4. IUD present in the endometrial cavity. Electronically Signed   By: Eddie Candle M.D.   On: 04/28/2020 10:08        Scheduled Meds: . buPROPion  150 mg Oral Daily  . busPIRone  7.5 mg Oral BID  . cyclobenzaprine  5 mg Oral BID  . DULoxetine  60 mg Oral Daily  . enoxaparin (LOVENOX) injection  40 mg Subcutaneous Q24H  . gabapentin  100 mg Oral BID  . gabapentin  300 mg Oral QHS  . linaclotide  145 mcg Oral QAC breakfast  . meloxicam  15 mg Oral Daily  . pantoprazole  40 mg Oral Daily   Continuous Infusions: . sodium chloride 125 mL/hr at 04/28/20 1807  . cefTRIAXone  (ROCEPHIN)  IV 1 g (04/29/20 1411)  . lactated ringers Stopped (04/28/20 1608)     LOS: 1 day    Time spent: 25 minutes    Koal Eslinger, MD [facility]   To contact the attending provider between 7A-7P or the covering provider during after hours 7P-7A, please log into the web site [email or web address] and access using universal [place] password for that web site. If you do not have the  password, please call the hospital operator.  04/29/2020, 2:47 PM

## 2020-04-30 DIAGNOSIS — R6510 Systemic inflammatory response syndrome (SIRS) of non-infectious origin without acute organ dysfunction: Secondary | ICD-10-CM

## 2020-04-30 MED ORDER — AMOXICILLIN-POT CLAVULANATE 875-125 MG PO TABS: 1.0000 | ORAL_TABLET | Freq: Two times a day (BID) | ORAL | 0 refills | Status: AC

## 2020-04-30 NOTE — Discharge Instructions (Signed)
Pyelonephritis, Adult °Pyelonephritis is an infection that occurs in the kidney. The kidneys are the organs that filter a person's blood and move waste out of the bloodstream and into the urine. Urine passes from the kidneys, through tubes called ureters, and into the bladder. There are two main types of pyelonephritis: °· Infections that come on quickly without any warning (acute pyelonephritis). °· Infections that last for a long period of time (chronic pyelonephritis). °In most cases, the infection clears up with treatment and does not cause further problems. More severe infections or chronic infections can sometimes spread to the bloodstream or lead to other problems with the kidneys. °What are the causes? °This condition is usually caused by: °· Bacteria traveling from the bladder up to the kidney. This may occur after having a bladder infection (cystitis) or urinary tract infection (UTI). °· Bladder infections caused from bacteria traveling from the bloodstream to the kidney. °What increases the risk? °This condition is more likely to develop in: °· Pregnant women. °· Older people. °· People who have any of these conditions: °? Diabetes. °? Inflammation of the prostate gland (prostatitis), in males. °? Kidney stones or bladder stones. °? Other abnormalities of the kidney or ureter. °? Cancer. °· People who have a catheter placed in the bladder. °· People who are sexually active. °· Women who use spermicides. °· People who have had a prior UTI. °What are the signs or symptoms? °Symptoms of this condition include: °· Frequent urination. °· Strong or persistent urge to urinate. °· Burning or stinging when urinating. °· Abdominal pain. °· Back pain. °· Pain in the side or flank area. °· Fever or chills. °· Blood in the urine, or dark urine. °· Nausea or vomiting. °How is this diagnosed? °This condition may be diagnosed based on: °· Your medical history and a physical exam. °· Urine tests. °· Blood tests. °You may  also have imaging tests of the kidneys, such as an ultrasound or CT scan. °How is this treated? °Treatment for this condition may depend on the severity of the infection. °· If the infection is mild and is found early, you may be treated with antibiotic medicines taken by mouth (orally). You will need to drink fluids to remain hydrated. °· If the infection is more severe, you may need to stay in the hospital and receive antibiotics given directly into a vein through an IV. You may also need to receive fluids through an IV if you are not able to remain hydrated. After your hospital stay, you may need to take oral antibiotics for a period of time. °Other treatments may be required, depending on the cause of the infection. °Follow these instructions at home: °Medicines °· Take your antibiotic medicine as told by your health care provider. Do not stop taking the antibiotic even if you start to feel better. °· Take over-the-counter and prescription medicines only as told by your health care provider. °General instructions ° °· Drink enough fluid to keep your urine pale yellow. °· Avoid caffeine, tea, and carbonated beverages. They tend to irritate the bladder. °· Urinate often. Avoid holding in urine for long periods of time. °· Urinate before and after sex. °· After a bowel movement, women should cleanse from front to back. Use each tissue only once. °· Keep all follow-up visits as told by your health care provider. This is important. °Contact a health care provider if: °· Your symptoms do not get better after 2 days of treatment. °· Your symptoms get worse. °·   You have a fever. °Get help right away if you: °· Are unable to take your antibiotics or fluids. °· Have shaking chills. °· Vomit. °· Have severe flank or back pain. °· Have extreme weakness or fainting. °Summary °· Pyelonephritis is a urinary tract infection (UTI) that occurs in the kidney. °· Treatment for this condition may depend on the severity of the  infection. °· Take your antibiotic medicine as told by your health care provider. Do not stop taking the antibiotic even if you start to feel better. °· Drink enough fluid to keep your urine pale yellow. °· Keep all follow-up visits as told by your health care provider. This is important. °This information is not intended to replace advice given to you by your health care provider. Make sure you discuss any questions you have with your health care provider. °Document Revised: 09/24/2018 Document Reviewed: 09/24/2018 °Elsevier Patient Education © 2020 [employer] Inc. ° °

## 2020-04-30 NOTE — Progress Notes (Signed)
Patient has gained 5 pounds overnight. Patient has facial and extremity edema. Will continue to monitor.

## 2020-04-30 NOTE — Discharge Summary (Signed)
Physician Discharge Summary  Natasha Estrada [id number] DOB: [birth year] DOA: 04/28/2020  PCP: Wayland Denis, PA-C  Admit date: 04/28/2020 Discharge date: 04/30/2020  Admitted From: Home Disposition: Home  Recommendations for Outpatient Follow-up:  1. Follow up with PCP in 1 week. 2. Patient will complete 10-day course of antibiotics after 6/4  Home Health: None Equipment/Devices: None  Discharge Condition: Fair CODE STATUS: Full code Diet recommendation: Regular     Discharge Diagnoses:  Principal Problem:   Acute pyelonephritis  Active Problems:   Anxiety   Gastroesophageal reflux disease without esophagitis   History of depression  Brief narrative/ HPI 49 year old female with history of nephrolithiasis, recurrent UTIs, hypertension and GERD presented to the ED from the orthopedics office where she was being evaluated for cervical neck pain.  Patient has been complaining of low back pain mainly in the right flank for past few days and having generalized weakness with lightheadedness.  She was prescribed ciprofloxacin for his back at an urgent care for UTI. In the orthopedics office she was found to be hypotensive with blood pressure 76/40 mmHg.  Patient given 2.5 L fluid bolus with improvement in her blood pressure.  UA suggestive of UTI with significant pyuria.  CT of the abdomen pelvis showed nonobstructive bilateral nephrolithiasis.  She received a dose of IV aztreonam in the ED and admitted for acute pyelonephritis.   Hospital course  Principal Problem:  SIRS with Acute pyelonephritis No signs of sepsis. Placed on IV Rocephin.  Prior urine culture growing pansensitive E. coli.    Blood culture negative and urine culture without any growth.  Blood pressure remains stable. Blood pressure currently stable without signs of sepsis. Patient will be discharged on oral Augmentin to complete total 10-day course of antibiotics.  Patient is listed to have allergy to  cefuroxime (causes shortness of breath) but has tolerated Rocephin well in the hospital.   Active Problems:   Anxiety and depression Continue Wellbutrin, buspirone and Cymbalta  Essential hypertension All blood pressure meds held due to hypotension.  Now stable and resume upon discharge.   GERD Continue PPI  Procedure: CT abdomen pelvis  Consults: None Family communication: Husband at bedside  Discharge Instructions   Allergies as of 04/30/2020      Reactions   Cefuroxime Shortness Of Breath   Tolerates Augmentin per husband Joe   Sulfa Antibiotics Rash      Medication List    STOP taking these medications   ciprofloxacin 500 MG tablet Commonly known as: CIPRO     TAKE these medications   amoxicillin-clavulanate 875-125 MG tablet Commonly known as: Augmentin Take 1 tablet by mouth 2 (two) times daily for 8 days.   buPROPion 150 MG 24 hr tablet Commonly known as: WELLBUTRIN XL Take 150 mg by mouth daily.   busPIRone 7.5 MG tablet Commonly known as: BUSPAR Take 7.5 mg by mouth 2 (two) times daily.   cyclobenzaprine 10 MG tablet Commonly known as: FLEXERIL Take 10 mg by mouth at bedtime.   DULoxetine 30 MG capsule Commonly known as: CYMBALTA Take 60 mg by mouth daily.   fexofenadine-pseudoephedrine 60-120 MG 12 hr tablet Commonly known as: ALLEGRA-D Take 1 tablet by mouth 2 (two) times daily.   gabapentin 300 MG capsule Commonly known as: NEURONTIN Take 600 mg by mouth at bedtime.   levonorgestrel 20 MCG/24HR IUD Commonly known as: MIRENA by Intrauterine route.   linaclotide 145 MCG Caps capsule Commonly known as: LINZESS Take 145 mcg by mouth daily before breakfast.  losartan 25 MG tablet Commonly known as: COZAAR Take 25 mg by mouth daily.   omeprazole 20 MG capsule Commonly known as: PRILOSEC Take 20 mg by mouth daily.   propranolol 40 MG tablet Commonly known as: INDERAL TAKE 1 TABLET(40 MG) BY MOUTH TWICE DAILY AS NEEDED.  PATIENT TAKE AS NEEDED   zolpidem 10 MG tablet Commonly known as: AMBIEN Take 10 mg by mouth at bedtime as needed for sleep.      Follow-up Information    Wayland Denis, PA-C. Schedule an appointment as soon as possible for a visit in 1 week(s).   Specialty: Physician Assistant Contact information: [place] [address] [phone]          Allergies  Allergen Reactions  . Cefuroxime Shortness Of Breath    Tolerates Augmentin per husband Joe  . Sulfa Antibiotics Rash      Procedures/Studies: DG Chest Port 1 View  Result Date: 04/28/2020 CLINICAL DATA:  Hypotension and weakness EXAM: PORTABLE CHEST 1 VIEW COMPARISON:  October 06, 2019 FINDINGS: The lungs are clear. The heart size and pulmonary vascularity are normal. No adenopathy. Postoperative changes noted in the left humeral head region. IMPRESSION: Lungs clear.  Cardiac silhouette within normal limits. Electronically Signed   By: Lowella Grip III M.D.   On: 04/28/2020 09:42   CT Renal Stone Study  Result Date: 04/28/2020 CLINICAL DATA:  Flank pain, low back pain, lethargy EXAM: CT ABDOMEN AND PELVIS WITHOUT CONTRAST TECHNIQUE: Multidetector CT imaging of the abdomen and pelvis was performed following the standard protocol without IV contrast. COMPARISON:  12/16/2018 FINDINGS: Lower chest: No acute abnormality. Hepatobiliary: No focal liver abnormality is seen. Status post cholecystectomy. No biliary dilatation. Pancreas: Unremarkable. No pancreatic ductal dilatation or surrounding inflammatory changes. Spleen: Normal in size without significant abnormality. Adrenals/Urinary Tract: Adrenal glands are unremarkable. Nonobstructive calculi of the superior pole of the left kidney. Punctuate nonobstructive calculus of the midportion of the right kidney. No hydronephrosis. Capsular calcifications about the lateral midportion of the right kidney, unchanged compared to prior examination.  Bladder is unremarkable. Stomach/Bowel: Status post Roux-en-Y gastric bypass. Appendix appears normal. No evidence of bowel wall thickening, distention, or inflammatory changes. Vascular/Lymphatic: No significant vascular findings are present. No enlarged abdominal or pelvic lymph nodes. Reproductive: IUD present in the endometrial cavity. Other: No abdominal wall hernia or abnormality. No abdominopelvic ascites. Musculoskeletal: No acute or significant osseous findings. IMPRESSION: 1. Nonobstructive bilateral nephrolithiasis. No evidence of ureteral calculus or hydronephrosis. 2. No acute noncontrast CT findings of the abdomen or pelvis. 3. Status post Roux-en-Y gastric bypass. 4. IUD present in the endometrial cavity. Electronically Signed   By: Eddie Candle M.D.   On: 04/28/2020 10:08       Subjective: Seen and examined.  Reports face and arms to be swollen with IV fluids.  No flank pain or dysuria.  Discharge Exam: Vitals:   04/30/20 0324 04/30/20 0733  BP: 135/86 135/87  Pulse: 76 69  Resp: 20 18  Temp: 97.9 F (36.6 C) 97.8 F (36.6 C)  SpO2: 98% 100%   Vitals:   04/29/20 1558 04/29/20 1944 04/30/20 0324 04/30/20 0733  BP: 122/78 (!) 133/91 135/86 135/87  Pulse: 83 88 76 69  Resp: 17 19 20 18   Temp: 98 F (36.7 C) 98.1 F (36.7 C) 97.9 F (36.6 C) 97.8 F (36.6 C)  TempSrc:   Oral   SpO2: 98% 98% 98% 100%  Weight:   90.5 kg  Height:        General: Not in distress, face appears puffy HEENT: Moist mucosa, supple neck Chest: Clear CVs: Normal S1 and S2 GI: Soft, nondistended, nontender, no CVA tenderness Musculoskeletal: Warm, no edema   The results of significant diagnostics from this hospitalization (including imaging, microbiology, ancillary and laboratory) are listed below for reference.     Microbiology: Recent Results (from the past 240 hour(s))  Blood Culture (routine x 2)     Status: None (Preliminary result)   Collection Time: 04/28/20  9:20 AM    Specimen: BLOOD  Result Value Ref Range Status   Specimen Description BLOOD LEFT WRIST  Final   Special Requests   Final    BOTTLES DRAWN AEROBIC AND ANAEROBIC Blood Culture adequate volume   Culture   Final    NO GROWTH 2 DAYS Performed at [facility], [address], [place], [place] [address]    Report Status PENDING  Incomplete  Blood Culture (routine x 2)     Status: None (Preliminary result)   Collection Time: 04/28/20  9:20 AM   Specimen: BLOOD  Result Value Ref Range Status   Specimen Description BLOOD RIGHT [facility]  Final   Special Requests   Final    BOTTLES DRAWN AEROBIC AND ANAEROBIC Blood Culture adequate volume   Culture   Final    NO GROWTH 2 DAYS Performed at [facility], [address] [place], [place] [address]    Report Status PENDING  Incomplete  Urine culture     Status: None   Collection Time: 04/28/20 12:13 PM   Specimen: In/Out Cath Urine  Result Value Ref Range Status   Specimen Description   Final    IN/OUT CATH URINE Performed at [facility], [address] [place], [place] [address]    Special Requests   Final    NONE Performed at [facility], [address], [place], [place] [address]    Culture   Final    NO GROWTH Performed at [facility], [place] [address] [place], [place] [address]    Report Status 04/29/2020 FINAL  Final  SARS Coronavirus 2 by RT PCR (hospital order, performed in [facility] hospital lab) Nasopharyngeal Nasopharyngeal Swab     Status: None   Collection Time: 04/28/20  6:49 PM   Specimen: Nasopharyngeal Swab  Result Value Ref Range Status   SARS Coronavirus 2 NEGATIVE NEGATIVE Final    Comment: (NOTE) SARS-CoV-2 target nucleic acids are NOT DETECTED. The SARS-CoV-2 RNA is generally detectable in upper and lower respiratory specimens during the acute phase of infection. The lowest concentration of SARS-CoV-2 viral copies this assay can detect is 250 copies / mL. A negative  result does not preclude SARS-CoV-2 infection and should not be used as the sole basis for treatment or other patient management decisions.  A negative result may occur with improper specimen collection / handling, submission of specimen other than nasopharyngeal swab, presence of viral mutation(s) within the areas targeted by this assay, and inadequate number of viral copies (<250 copies / mL). A negative result must be combined with clinical observations, patient history, and epidemiological information. Fact Sheet for Patients:   StrictlyIdeas.no Fact Sheet for Healthcare Providers: BankingDealers.co.za This test is not yet approved or cleared  by the [place] FDA and has been authorized for detection and/or diagnosis of SARS-CoV-2 by FDA under an Emergency Use Authorization (EUA).  This EUA will remain in effect (meaning this test  can be used) for the duration of the COVID-19 declaration under Section 564(b)(1) of the Act, 21 U.S.C. section 360bbb-3(b)(1), unless the authorization is terminated or revoked sooner. Performed at [facility], [address] [place], [place] [address]      Labs: BNP (last 3 results) No results for input(s): BNP in the last 8760 hours. Basic Metabolic Panel: Recent Labs  Lab 04/28/20 0920 04/29/20 0559  NA 137 142  K 4.3 4.0  CL 103 115*  CO2 26 24  GLUCOSE 107* 138*  BUN 21* 17  CREATININE 0.91 0.73  CALCIUM 8.5* 8.5*   Liver Function Tests: Recent Labs  Lab 04/28/20 0920  AST 18  ALT 17  ALKPHOS 72  BILITOT 1.0  PROT 6.5  ALBUMIN 3.7   No results for input(s): LIPASE, AMYLASE in the last 168 hours. No results for input(s): AMMONIA in the last 168 hours. CBC: Recent Labs  Lab 04/28/20 0920 04/29/20 0559  WBC 5.5 12.0*  NEUTROABS 3.3  --   HGB 11.0* 9.7*  HCT 34.4* 29.7*  MCV 88.0 85.6  PLT 263 249   Cardiac Enzymes: No results for input(s): CKTOTAL, CKMB,  CKMBINDEX, TROPONINI in the last 168 hours. BNP: Invalid input(s): POCBNP CBG: No results for input(s): GLUCAP in the last 168 hours. D-Dimer No results for input(s): DDIMER in the last 72 hours. Hgb A1c No results for input(s): HGBA1C in the last 72 hours. Lipid Profile No results for input(s): CHOL, HDL, LDLCALC, TRIG, CHOLHDL, LDLDIRECT in the last 72 hours. Thyroid function studies Recent Labs    04/28/20 0920  TSH 1.434   Anemia work up No results for input(s): VITAMINB12, FOLATE, FERRITIN, TIBC, IRON, RETICCTPCT in the last 72 hours. Urinalysis    Component Value Date/Time   COLORURINE YELLOW (A) 04/28/2020 1213   APPEARANCEUR HAZY (A) 04/28/2020 1213   APPEARANCEUR Hazy (A) 09/12/2019 1048   LABSPEC 1.006 04/28/2020 1213   PHURINE 6.0 04/28/2020 1213   GLUCOSEU NEGATIVE 04/28/2020 1213   HGBUR SMALL (A) 04/28/2020 1213   BILIRUBINUR NEGATIVE 04/28/2020 1213   BILIRUBINUR negative 10/31/2019 1323   BILIRUBINUR Negative 09/12/2019 1048   KETONESUR NEGATIVE 04/28/2020 1213   PROTEINUR NEGATIVE 04/28/2020 1213   UROBILINOGEN 0.2 10/31/2019 1323   NITRITE POSITIVE (A) 04/28/2020 1213   LEUKOCYTESUR MODERATE (A) 04/28/2020 1213   Sepsis Labs Invalid input(s): PROCALCITONIN,  WBC,  LACTICIDVEN Microbiology Recent Results (from the past 240 hour(s))  Blood Culture (routine x 2)     Status: None (Preliminary result)   Collection Time: 04/28/20  9:20 AM   Specimen: BLOOD  Result Value Ref Range Status   Specimen Description BLOOD LEFT WRIST  Final   Special Requests   Final    BOTTLES DRAWN AEROBIC AND ANAEROBIC Blood Culture adequate volume   Culture   Final    NO GROWTH 2 DAYS Performed at [facility], [address] [place], [place] [address]    Report Status PENDING  Incomplete  Blood Culture (routine x 2)     Status: None (Preliminary result)   Collection Time: 04/28/20  9:20 AM   Specimen: BLOOD  Result Value Ref Range Status   Specimen  Description BLOOD RIGHT [facility]  Final   Special Requests   Final    BOTTLES DRAWN AEROBIC AND ANAEROBIC Blood Culture adequate volume   Culture   Final    NO GROWTH 2 DAYS Performed at [facility], [address], [place], [place] [address]  Report Status PENDING  Incomplete  Urine culture     Status: None   Collection Time: 04/28/20 12:13 PM   Specimen: In/Out Cath Urine  Result Value Ref Range Status   Specimen Description   Final    IN/OUT CATH URINE Performed at [facility], [address], [place], [place] [address]    Special Requests   Final    NONE Performed at [facility], [address] [place], [place] [address]    Culture   Final    NO GROWTH Performed at [facility], [place] [address], [place], [place] [address]    Report Status 04/29/2020 FINAL  Final  SARS Coronavirus 2 by RT PCR (hospital order, performed in [facility] hospital lab) Nasopharyngeal Nasopharyngeal Swab     Status: None   Collection Time: 04/28/20  6:49 PM   Specimen: Nasopharyngeal Swab  Result Value Ref Range Status   SARS Coronavirus 2 NEGATIVE NEGATIVE Final    Comment: (NOTE) SARS-CoV-2 target nucleic acids are NOT DETECTED. The SARS-CoV-2 RNA is generally detectable in upper and lower respiratory specimens during the acute phase of infection. The lowest concentration of SARS-CoV-2 viral copies this assay can detect is 250 copies / mL. A negative result does not preclude SARS-CoV-2 infection and should not be used as the sole basis for treatment or other patient management decisions.  A negative result may occur with improper specimen collection / handling, submission of specimen other than nasopharyngeal swab, presence of viral mutation(s) within the areas targeted by this assay, and inadequate number of viral copies (<250 copies / mL). A negative result must be combined with clinical observations, patient history, and epidemiological information. Fact  Sheet for Patients:   StrictlyIdeas.no Fact Sheet for Healthcare Providers: BankingDealers.co.za This test is not yet approved or cleared  by the [place] FDA and has been authorized for detection and/or diagnosis of SARS-CoV-2 by FDA under an Emergency Use Authorization (EUA).  This EUA will remain in effect (meaning this test can be used) for the duration of the COVID-19 declaration under Section 564(b)(1) of the Act, 21 U.S.C. section 360bbb-3(b)(1), unless the authorization is terminated or revoked sooner. Performed at [facility], [address], [place], [place] [address]      Time coordinating discharge: 35 minutes  SIGNED:   Louellen Molder, MD  [facility] 04/30/2020, 9:21 AM Pager   If 7PM-7AM, please contact night-coverage [email or web address] Password [email or web address]

## 2020-04-30 NOTE — Progress Notes (Signed)
Patient discharged to home via wheelchair. All questions answered and discharge instructions given.

## 2020-05-03 LAB — CULTURE, BLOOD (ROUTINE X 2)
Culture: NO GROWTH
Culture: NO GROWTH
Special Requests: ADEQUATE
Special Requests: ADEQUATE

## 2020-05-05 ENCOUNTER — Ambulatory Visit
Admission: RE | Admit: 2020-05-05 | Discharge: 2020-05-05 | Disposition: A | Discharge status: Home / Self Care | Payer: BC Managed Care – PPO | Source: Ambulatory Visit | Attending: Urology | Admitting: Urology

## 2020-05-05 ENCOUNTER — Other Ambulatory Visit: Payer: Self-pay

## 2020-05-05 ENCOUNTER — Telehealth: Payer: Self-pay | Admitting: Urology

## 2020-05-05 ENCOUNTER — Encounter: Payer: Self-pay | Admitting: Urology

## 2020-05-05 ENCOUNTER — Ambulatory Visit: Payer: BC Managed Care – PPO | Admitting: Urology

## 2020-05-05 VITALS — BP 128/87 | HR 82 | Ht 65.0 in | Wt 183.0 lb

## 2020-05-05 DIAGNOSIS — N200 Calculus of kidney: Secondary | ICD-10-CM

## 2020-05-05 DIAGNOSIS — N390 Urinary tract infection, site not specified: Secondary | ICD-10-CM | POA: Diagnosis not present

## 2020-05-05 DIAGNOSIS — R109 Unspecified abdominal pain: Secondary | ICD-10-CM | POA: Diagnosis not present

## 2020-05-05 LAB — URINALYSIS, COMPLETE
Bilirubin, UA: NEGATIVE
Glucose, UA: NEGATIVE
Ketones, UA: NEGATIVE
Nitrite, UA: NEGATIVE
Protein,UA: NEGATIVE
RBC, UA: NEGATIVE
Specific Gravity, UA: 1.02 (ref 1.005–1.030)
Urobilinogen, Ur: 0.2 mg/dL (ref 0.2–1.0)
pH, UA: 6 (ref 5.0–7.5)

## 2020-05-05 LAB — MICROSCOPIC EXAMINATION: Bacteria, UA: NONE SEEN

## 2020-05-05 LAB — BLADDER SCAN AMB NON-IMAGING: Scan Result: 16

## 2020-05-05 NOTE — Progress Notes (Signed)
05/05/2020 10:34 AM   Natasha Estrada [birth year] [id number]  Referring provider: Wayland Denis, PA-C [address] [place],   [address]  Chief Complaint  Patient presents with  . Recurrent UTI    HPI: Natasha Estrada is a 49 year old female with a history of recurrent urinary tract infections and stone disease who presents today after hospitalization for pyelonephritis.  Patient presented to the emergency room on Apr 28, 2020 with the complaint of right flank pain and hypotension.  She had been recently diagnosed with pyelonephritis with her PCP and prescribed Cipro, but unfortunately the bacteria was found resistant to that antibiotic.  She was admitted for SIRS with acute pyelonephritis.  She was discharged on a 10-day course of Augmentin which she started on Friday.  CT renal stone study dated Apr 28, 2020 noted left nephrolithiasis and encapsulated lesion in the right kidney.  She has had several urinary tract infections positive for E. coli with very similar resistant patterns.  Her symptoms with a urinary tract infection consist of malodorous urine, dysuria and frequency.  She is not sexually active.  She does not use vibrators.  She is not postmenopausal.   She has IBS with constipation managed with stool softner that leads to soft stools.  She does engage in good perineal hygiene. She does not take tub baths.  She has tried cranberry tablets and is eating yogurt.  She has not started D-mannose, but she will start them tomorrow.    She has minimal stress urinary incontinence and does not wear incontinence pads.  She has baseline urinary symptoms of frequency, urgency, intermittency, nocturia and a weak urinary stream.  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  She states on average days she will start at 7:30 in the morning and work till 6:30 in the evening and only use the  restroom 2-3 times.  She has recurrent stone disease with her most recent ureteroscopic stone removal in November 2018.  She states that yesterday she was having horrible left flank pain which radiated down into her left lower quadrant and then today the left flank pain has abated, but her right flank is causing her discomfort.  She is having some mild nausea.  Patient denies any modifying or aggravating factors.  Patient denies any gross hematuria, dysuria or suprapubic/flank pain.  Patient denies any fevers, chills, nausea or vomiting.   Her UA today is unremarkable.  Her PVR is 16 mL.  KUB noted copious amount of stool in the right colon and gas collected in the left colon.   PMH: Past Medical History:  Diagnosis Date  . Anxiety   . Depression   . Fibroid   . GERD (gastroesophageal reflux disease)   . Hypertension   . Insomnia   . Kidney stone     Surgical History: Past Surgical History:  Procedure Laterality Date  . GASTRIC ROUX-EN-Y    . ROTATOR CUFF REPAIR      Home Medications:  Allergies as of 05/05/2020      Reactions   Cefuroxime Shortness Of Breath   Tolerates Augmentin per husband Joe   Sulfa Antibiotics Rash      Medication List       Accurate as of May 05, 2020 10:34 AM. If you have any questions, ask your nurse or doctor.        amoxicillin-clavulanate 875-125 MG tablet Commonly known as:  Augmentin Take 1 tablet by mouth 2 (two) times daily for 8 days.   buPROPion 150 MG 24 hr tablet Commonly known as: WELLBUTRIN XL Take 150 mg by mouth daily.   busPIRone 7.5 MG tablet Commonly known as: BUSPAR Take 7.5 mg by mouth 2 (two) times daily.   cyclobenzaprine 10 MG tablet Commonly known as: FLEXERIL Take 10 mg by mouth at bedtime.   DULoxetine 30 MG capsule Commonly known as: CYMBALTA Take 60 mg by mouth daily.   fexofenadine-pseudoephedrine 60-120 MG 12 hr tablet Commonly known as: ALLEGRA-D Take 1 tablet by mouth 2 (two) times daily.     gabapentin 300 MG capsule Commonly known as: NEURONTIN Take 600 mg by mouth at bedtime.   levonorgestrel 20 MCG/24HR IUD Commonly known as: MIRENA by Intrauterine route.   linaclotide 145 MCG Caps capsule Commonly known as: LINZESS Take 145 mcg by mouth daily before breakfast.   losartan 25 MG tablet Commonly known as: COZAAR Take 25 mg by mouth daily.   omeprazole 20 MG capsule Commonly known as: PRILOSEC Take 20 mg by mouth daily.   propranolol 40 MG tablet Commonly known as: INDERAL TAKE 1 TABLET(40 MG) BY MOUTH TWICE DAILY AS NEEDED. PATIENT TAKE AS NEEDED   zolpidem 10 MG tablet Commonly known as: AMBIEN Take 10 mg by mouth at bedtime as needed for sleep.       Allergies:  Allergies  Allergen Reactions  . Cefuroxime Shortness Of Breath    Tolerates Augmentin per husband Joe  . Sulfa Antibiotics Rash    Family History: History reviewed. No pertinent family history.  Social History:  reports that she has never smoked. She has never used smokeless tobacco. She reports previous alcohol use. She reports that she does not use drugs.  ROS: Pertinent ROS in HPI  Physical Exam: BP 128/87   Pulse 82   Ht _0  (1.651 m)   Wt 183 lb (83 kg)   BMI 30.45 kg/m   Constitutional:  Well nourished. Alert and oriented, No acute distress. HEENT: [place] AT, mask in place.  Trachea midline Cardiovascular: No clubbing, cyanosis, or edema. Respiratory: Normal respiratory effort, no increased work of breathing. GI: Abdomen is soft, non tender, non distended, no abdominal masses.  No hernias appreciated.  Stool sample for occult testing is not indicated.   GU: No CVA tenderness.  No bladder fullness or masses.  Normal external genitalia, normal pubic hair distribution, no lesions.  Normal urethral meatus, no lesions, no prolapse, no discharge.   No urethral masses, tenderness and/or tenderness. No bladder fullness, tenderness or masses.  Pale vagina mucosa, fair estrogen effect,  no discharge, no lesions, fair pelvic support, grade 2 cystocele and grade 1 rectocele noted.  No cervical motion tenderness.  Uterus is freely mobile and non-fixed.  No adnexal/parametria masses or tenderness noted.  Anus and perineum are without rashes or lesions.    Skin: No rashes, bruises or suspicious lesions. Lymph: No inguinal adenopathy. Neurologic: Grossly intact, no focal deficits, moving all 4 extremities. Psychiatric: Normal mood and affect.   Laboratory Data: Lab Results  Component Value Date   WBC 12.0 (H) 04/29/2020   HGB 9.7 (L) 04/29/2020   HCT 29.7 (L) 04/29/2020   MCV 85.6 04/29/2020   PLT 249 04/29/2020    Lab Results  Component Value Date   CREATININE 0.73 04/29/2020    Lab Results  Component Value Date   TSH 1.434 04/28/2020    Lab Results  Component Value Date  AST 18 04/28/2020   Lab Results  Component Value Date   ALT 17 04/28/2020    Urinalysis Component     Latest Ref Rng & Units 05/05/2020          Specific Gravity, UA     1.005 - 1.030 1.020  pH, UA     5.0 - 7.5 6.0  Color, UA     Yellow Yellow  Appearance Ur     Clear Hazy (A)  Leukocytes,UA     Negative Trace (A)  Protein,UA     Negative/Trace Negative  Glucose, UA     Negative Negative  Ketones, UA     Negative Negative  RBC, UA     Negative Negative  Bilirubin, UA     Negative Negative  Urobilinogen, Ur     0.2 - 1.0 mg/dL 0.2  Nitrite, UA     Negative Negative  Microscopic Examination      See below:   Component     Latest Ref Rng & Units 05/05/2020  WBC, UA     0 - 5 /hpf 6-10 (A)  RBC     0 - 2 /hpf 0-2  Epithelial Cells (non renal)     0 - 10 /hpf 0-10  Casts     None seen /lpf Present (A)  Cast Type     N/A Hyaline casts  Bacteria, UA     None seen/Few None seen    I have reviewed the labs.   Pertinent Imaging: Results for SERENIDY, WALTZ (MRN [id number]) as of 05/05/2020 10:35  Ref. Range 05/05/2020 09:08  Scan Result Unknown 16   CLINICAL  DATA:  Flank pain, low back pain, lethargy  EXAM: CT ABDOMEN AND PELVIS WITHOUT CONTRAST  TECHNIQUE: Multidetector CT imaging of the abdomen and pelvis was performed following the standard protocol without IV contrast.  COMPARISON:  12/16/2018  FINDINGS: Lower chest: No acute abnormality.  Hepatobiliary: No focal liver abnormality is seen. Status post cholecystectomy. No biliary dilatation.  Pancreas: Unremarkable. No pancreatic ductal dilatation or surrounding inflammatory changes.  Spleen: Normal in size without significant abnormality.  Adrenals/Urinary Tract: Adrenal glands are unremarkable. Nonobstructive calculi of the superior pole of the left kidney. Punctuate nonobstructive calculus of the midportion of the right kidney. No hydronephrosis. Capsular calcifications about the lateral midportion of the right kidney, unchanged compared to prior examination. Bladder is unremarkable.  Stomach/Bowel: Status post Roux-en-Y gastric bypass. Appendix appears normal. No evidence of bowel wall thickening, distention, or inflammatory changes.  Vascular/Lymphatic: No significant vascular findings are present. No enlarged abdominal or pelvic lymph nodes.  Reproductive: IUD present in the endometrial cavity.  Other: No abdominal wall hernia or abnormality. No abdominopelvic ascites.  Musculoskeletal: No acute or significant osseous findings.  IMPRESSION: 1. Nonobstructive bilateral nephrolithiasis. No evidence of ureteral calculus or hydronephrosis. 2. No acute noncontrast CT findings of the abdomen or pelvis. 3. Status post Roux-en-Y gastric bypass. 4. IUD present in the endometrial cavity.   Electronically Signed   By: Eddie Candle M.D.   On: 04/28/2020 10:08  CLINICAL DATA:  Right flank pain.  EXAM: ABDOMEN - 1 VIEW  COMPARISON:  CT abdomen pelvis 04/28/2020 the the  FINDINGS: There are a few tiny calculi projecting over the left mid  kidney. Calculated calcifications in the lateral aspect of the right kidney. No calculi projecting over the course of the bilateral ureters. A few small calcific densities in the pelvis likely correspond to vascular phleboliths. Nonobstructive bowel gas pattern. IUD  in the pelvis. No acute finding in the visualized skeleton.  IMPRESSION: Stone burden similar in appearance to the recent prior CT. The punctate right renal calculi are not definitely visualized.   Electronically Signed   By: Audie Pinto M.D.   On: 05/05/2020 16:50 I have independently reviewed the films.  See HPI.  Assessment & Plan:    1. Recurrent UTI - criteria for recurrent UTI has been met with 2 or more infections in 6 months or 3 or greater infections in one year  - patient is instructed to increase their water intake until the urine is pale yellow or clear (10 to 12 cups daily)  - patient is encouraged to continue cranberry tablets and yogurt intake and to start the d-mannose - avoid soaking in tubs and wipe front to back after urinating                          - complete 10-day course of Augmentin - Urinalysis, Complete - Bladder Scan (Post Void Residual) in office -Provide a work note allowing the patient to use the restroom at well and allow her to increase fluid intake during the day as her limited fluid intake and infrequent trips to the restroom are a risk factor for her recurrent UTIs -Schedule cystoscopy to evaluate for recurrent UTIs I have explained to the patient that they will  be scheduled for a cystoscopy in our office to evaluate their bladder.  The cystoscopy consists of passing a tube with a lens up through their urethra and into their urinary bladder.   We will inject the urethra with a lidocaine gel prior to introducing the cystoscope to help with any discomfort during the procedure.   After the procedure, they might experience blood in the urine and discomfort with urination.  This will  abate after the first few voids.  I have  encouraged the patient to increase water intake  during this time.  Patient denies any allergies to lidocaine.   2.  Bilateral flank pain -We will obtain KUB today to see if any of her renal stones have migrated into her ureter -We will call with results  3. Bilateral nephrolithiasis -Patient would like to discuss definitive stone treatment pending the results of her cystoscopy  Return for Cystoscopy for recurrent urinary tract infections with Dr. John Giovanni.  These notes generated with voice recognition software. I apologize for typographical errors.  Zara Council, PA-C  [facility] [address]  [place], [place] [address] [phone]

## 2020-05-05 NOTE — Telephone Encounter (Signed)
I have spoken with Natasha Estrada and reviewed her xrays with her.  Her right colon is full stool and she has gas in her left colon which may be the cause of her bilateral flank pain.  I advised her to contact her primary care physician for further ideas and how to have a successful bowel movement or to be referred to GI.

## 2020-05-21 ENCOUNTER — Other Ambulatory Visit: Payer: BC Managed Care – PPO | Admitting: Urology

## 2020-05-31 ENCOUNTER — Ambulatory Visit: Payer: BC Managed Care – PPO | Admitting: Urology

## 2020-05-31 ENCOUNTER — Other Ambulatory Visit: Payer: Self-pay

## 2020-05-31 ENCOUNTER — Encounter: Payer: Self-pay | Admitting: Urology

## 2020-05-31 VITALS — BP 126/85 | HR 76 | Ht 65.0 in | Wt 183.0 lb

## 2020-05-31 DIAGNOSIS — N200 Calculus of kidney: Secondary | ICD-10-CM

## 2020-05-31 DIAGNOSIS — M5412 Radiculopathy, cervical region: Secondary | ICD-10-CM | POA: Insufficient documentation

## 2020-05-31 DIAGNOSIS — N390 Urinary tract infection, site not specified: Secondary | ICD-10-CM

## 2020-05-31 NOTE — Progress Notes (Signed)
   06/01/20   CC:  Chief Complaint  Patient presents with  . Cysto    HPI: Recurrent UTI.  See Shannon's note 05/05/2020  Blood pressure 126/85, pulse 76, height 5\' 5"  (1.651 m), weight 183 lb (83 kg). NED. A&Ox3.   No respiratory distress   Abd soft, NT, ND Normal external genitalia with patent urethral meatus Mild dysuria  Cystoscopy Procedure Note  Patient identification was confirmed, informed consent was obtained, and patient was prepped using Betadine solution.  Lidocaine jelly was administered per urethral meatus.    Procedure: - Flexible cystoscope introduced, without any difficulty.   - Thorough search of the bladder revealed:    normal urethral meatus    normal urothelium    no stones    no ulcers     no tumors    no urethral polyps    no trabeculation  - Ureteral orifices were normal in position and appearance.  Post-Procedure: - Patient tolerated the procedure well  Assessment/ Plan:  Recurrent UTI  No abnormalities  Greater than 10 epithelial cells, will obtain culture and call with results   Abbie Sons, MD  [address] [place], [place] [address]  By signing my name below, I, [employer], attest that this documentation has been prepared under the direction and in the presence of John Giovanni, MD. Electronically Signed: Abbie Sons, MD 06/01/20, 11:12 PM   I have reviewed the above documentation for accuracy and completeness, and I agree with the above.   Abbie Sons, MD

## 2020-05-31 NOTE — Patient Instructions (Signed)
Will call with urine culture results

## 2020-06-01 ENCOUNTER — Encounter: Payer: Self-pay | Admitting: Urology

## 2020-06-01 LAB — MICROSCOPIC EXAMINATION: Epithelial Cells (non renal): >10 /hpf — AB (ref 0–10)

## 2020-06-01 LAB — URINALYSIS, COMPLETE
Bilirubin, UA: NEGATIVE
Glucose, UA: NEGATIVE
Ketones, UA: NEGATIVE
Nitrite, UA: NEGATIVE
Protein,UA: NEGATIVE
RBC, UA: NEGATIVE
Specific Gravity, UA: 1.015 (ref 1.005–1.030)
Urobilinogen, Ur: 0.2 mg/dL (ref 0.2–1.0)
pH, UA: 6 (ref 5.0–7.5)

## 2020-06-03 LAB — CULTURE, URINE COMPREHENSIVE

## 2020-06-04 ENCOUNTER — Telehealth: Payer: Self-pay | Admitting: Family Medicine

## 2020-06-04 ENCOUNTER — Telehealth: Payer: Self-pay

## 2020-06-04 NOTE — Telephone Encounter (Signed)
-----   Message from Abbie Sons, MD sent at 06/04/2020  9:11 AM EDT ----- Urine culture showed no evidence of infection w

## 2020-06-04 NOTE — Telephone Encounter (Signed)
Unable to leave message, voicemail full °

## 2020-06-04 NOTE — Telephone Encounter (Signed)
mychart notification sent 

## 2020-06-08 ENCOUNTER — Encounter: Payer: Self-pay | Admitting: Family Medicine

## 2020-06-14 ENCOUNTER — Other Ambulatory Visit: Payer: Self-pay

## 2020-06-14 ENCOUNTER — Emergency Department: Payer: BC Managed Care – PPO

## 2020-06-14 ENCOUNTER — Emergency Department
Admission: EM | Admit: 2020-06-14 | Discharge: 2020-06-14 | Disposition: A | Discharge status: Home / Self Care | Payer: BC Managed Care – PPO | Attending: Emergency Medicine | Admitting: Emergency Medicine

## 2020-06-14 DIAGNOSIS — R1030 Lower abdominal pain, unspecified: Secondary | ICD-10-CM | POA: Diagnosis not present

## 2020-06-14 DIAGNOSIS — R202 Paresthesia of skin: Secondary | ICD-10-CM

## 2020-06-14 DIAGNOSIS — R109 Unspecified abdominal pain: Secondary | ICD-10-CM | POA: Diagnosis present

## 2020-06-14 DIAGNOSIS — R110 Nausea: Secondary | ICD-10-CM | POA: Diagnosis not present

## 2020-06-14 DIAGNOSIS — K219 Gastro-esophageal reflux disease without esophagitis: Secondary | ICD-10-CM | POA: Diagnosis not present

## 2020-06-14 DIAGNOSIS — Z79899 Other long term (current) drug therapy: Secondary | ICD-10-CM | POA: Diagnosis not present

## 2020-06-14 DIAGNOSIS — I10 Essential (primary) hypertension: Secondary | ICD-10-CM | POA: Insufficient documentation

## 2020-06-14 DIAGNOSIS — N3001 Acute cystitis with hematuria: Secondary | ICD-10-CM | POA: Insufficient documentation

## 2020-06-14 LAB — URINALYSIS, COMPLETE (UACMP) WITH MICROSCOPIC
Bilirubin Urine: NEGATIVE
Glucose, UA: NEGATIVE mg/dL
Ketones, ur: NEGATIVE mg/dL
Nitrite: POSITIVE — AB
Protein, ur: NEGATIVE mg/dL
Specific Gravity, Urine: 1.012 (ref 1.005–1.030)
pH: 5 (ref 5.0–8.0)

## 2020-06-14 LAB — CBC
HCT: 38 % (ref 36.0–46.0)
Hemoglobin: 11.7 g/dL — ABNORMAL LOW (ref 12.0–15.0)
MCH: 27.3 pg (ref 26.0–34.0)
MCHC: 30.8 g/dL (ref 30.0–36.0)
MCV: 88.8 fL (ref 80.0–100.0)
Platelets: 349 10*3/uL (ref 150–400)
RBC: 4.28 MIL/uL (ref 3.87–5.11)
RDW: 14.8 % (ref 11.5–15.5)
WBC: 9 10*3/uL (ref 4.0–10.5)
nRBC: 0 % (ref 0.0–0.2)

## 2020-06-14 LAB — BASIC METABOLIC PANEL
Anion gap: 3 — ABNORMAL LOW (ref 5–15)
BUN: 20 mg/dL (ref 6–20)
CO2: 29 mmol/L (ref 22–32)
Calcium: 8.5 mg/dL — ABNORMAL LOW (ref 8.9–10.3)
Chloride: 109 mmol/L (ref 98–111)
Creatinine, Ser: 1 mg/dL (ref 0.44–1.00)
GFR calc Af Amer: >60 mL/min (ref >60)
GFR calc non Af Amer: >60 mL/min (ref >60)
Glucose, Bld: 94 mg/dL (ref 70–99)
Potassium: 4.2 mmol/L (ref 3.5–5.1)
Sodium: 141 mmol/L (ref 135–145)

## 2020-06-14 LAB — POCT PREGNANCY, URINE: Preg Test, Ur: NEGATIVE

## 2020-06-14 MED ORDER — KETOROLAC TROMETHAMINE 30 MG/ML IJ SOLN
15.0000 mg | Freq: Once | INTRAMUSCULAR | Status: AC
  Administered 2020-06-14: 15 mg via INTRAVENOUS
  Filled 2020-06-14: qty 1

## 2020-06-14 MED ORDER — OXYCODONE-ACETAMINOPHEN 5-325 MG PO TABS
1.0000 | ORAL_TABLET | ORAL | Status: DC | PRN
  Administered 2020-06-14: 1 via ORAL
  Filled 2020-06-14: qty 1

## 2020-06-14 MED ORDER — AMOXICILLIN-POT CLAVULANATE 875-125 MG PO TABS
1.0000 | ORAL_TABLET | Freq: Two times a day (BID) | ORAL | 0 refills | Status: AC
Start: 2020-06-14 — End: 2020-06-21

## 2020-06-14 MED ORDER — PIPERACILLIN-TAZOBACTAM 3.375 G IVPB 30 MIN
3.3750 g | Freq: Once | INTRAVENOUS | Status: AC
  Administered 2020-06-14: 3.375 g via INTRAVENOUS
  Filled 2020-06-14: qty 50

## 2020-06-14 NOTE — ED Provider Notes (Signed)
[facility] Emergency Department Provider Note  ____________________________________________  Time seen: Approximately 4:02 AM  I have reviewed the triage vital signs and the nursing notes.   HISTORY  Chief Complaint Flank Pain   HPI Natasha Estrada is a 49 y.o. female history of recurrent UTIs, fibrosis of the uterus, hypertension, kidney stones who presents for evaluation of left flank pain and suprapubic pain.   Patient reports sudden onset of sharp left flank and suprapubic pain.  Her symptoms have now resolved.  Has had nausea but no vomiting, no fever chills, no dysuria or hematuria, no chest pain or shortness of breath.  She is also complaining of pins-and-needles sensation to the left anterior shin which have been constant for the last 2 days. She describes it as "it is the same feeling you have if your foot goes to sleep." She denies any trauma, she denies leg pain, leg swelling, back pain.  No prior history of PE or DVT  Past Medical History:  Diagnosis Date  . Anxiety   . Depression   . Fibroid   . GERD (gastroesophageal reflux disease)   . Hypertension   . Insomnia   . Kidney stone   . Urinary tract infection     Patient Active Problem List   Diagnosis Date Noted  . Cervical radiculitis 05/31/2020  . Acute pyelonephritis 04/28/2020  . Insomnia 09/12/2019  . Menorrhagia with regular cycle 09/12/2019  . Nephrolithiasis 09/12/2019  . Recurrent UTI 09/12/2019  . Nontraumatic incomplete tear of left rotator cuff 08/29/2019  . Rotator cuff tendinitis, left 08/29/2019  . Status post left rotator cuff repair 08/29/2019  . Gastroesophageal reflux disease without esophagitis 10/11/2017  . History of depression 10/11/2017  . 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 Dysmenorrhea 04/17/2014  . Anxiety 04/14/2013    Past Surgical History:  Procedure Laterality Date  . GASTRIC ROUX-EN-Y    .  ROTATOR CUFF REPAIR      Prior to Admission medications   Medication Sig Start Date End Date Taking? Authorizing Provider  amoxicillin-clavulanate (AUGMENTIN) 875-125 MG tablet Take 1 tablet by mouth 2 (two) times daily for 7 days. 06/14/20 06/21/20  Rudene Re, MD  buPROPion (WELLBUTRIN XL) 150 MG 24 hr tablet Take 150 mg by mouth daily.  08/27/19   [provider]  busPIRone (BUSPAR) 7.5 MG tablet Take 7.5 mg by mouth 2 (two) times daily.  07/31/19   [provider]  cyclobenzaprine (FLEXERIL) 10 MG tablet Take 10 mg by mouth at bedtime.  09/03/19   [provider]  DULoxetine (CYMBALTA) 60 MG capsule Take 60 mg by mouth daily. 05/04/20   [provider]  fexofenadine-pseudoephedrine (ALLEGRA-D) 60-120 MG 12 hr tablet Take 1 tablet by mouth 2 (two) times daily. 10/06/19   Sable Feil, PA-C  gabapentin (NEURONTIN) 300 MG capsule Take 600 mg by mouth at bedtime.  09/03/19   [provider]  levonorgestrel (MIRENA) 20 MCG/24HR IUD by Intrauterine route.    [provider]  linaclotide Rolan Lipa) 145 MCG CAPS capsule Take 145 mcg by mouth daily before breakfast.  12/23/18   [provider]  losartan (COZAAR) 25 MG tablet Take 25 mg by mouth daily.  08/26/19   [provider]  omeprazole (PRILOSEC) 20 MG capsule Take 20 mg by mouth daily.  09/07/15 04/28/20  [provider]  propranolol (INDERAL) 40 MG tablet TAKE 1 TABLET(40 MG) BY MOUTH TWICE DAILY AS  NEEDED. PATIENT TAKE AS NEEDED 05/26/19   [provider]  zolpidem (AMBIEN) 10 MG tablet Take 10 mg by mouth at bedtime as needed for sleep.    [provider]    Allergies Cefuroxime and Sulfa antibiotics  No family history on file.  Social History Social History   Tobacco Use  . Smoking status: Never Smoker  . Smokeless tobacco: Never Used  Substance Use Topics  . Alcohol use: Not Currently  . Drug use: Never    Review of  Systems  Constitutional: Negative for fever. Eyes: Negative for visual changes. ENT: Negative for sore throat. Neck: No neck pain  Cardiovascular: Negative for chest pain. Respiratory: Negative for shortness of breath. Gastrointestinal: + suprapubic abdominal pain and nausea. No vomiting or diarrhea. Genitourinary: Negative for dysuria. + L flank pain Musculoskeletal: Negative for back pain. + L shin paresthesias Skin: Negative for rash. Neurological: Negative for headaches, weakness or numbness. Psych: No SI or HI  ____________________________________________   PHYSICAL EXAM:  VITAL SIGNS: ED Triage Vitals  Enc Vitals Group     BP 06/14/20 0215 (!) 118/92     Pulse Rate 06/14/20 0215 70     Resp 06/14/20 0215 17     Temp 06/14/20 0215 98.1 F (36.7 C)     Temp src --      SpO2 06/14/20 0215 99 %     Weight 06/14/20 0216 182 lb 15.7 oz (83 kg)     Height 06/14/20 0216 5\' 5"  (1.651 m)     Head Circumference --      Peak Flow --      Pain Score 06/14/20 0216 6     Pain Loc --      Pain Edu? --      Excl. in [place]? --     Constitutional: Alert and oriented. Well appearing and in no apparent distress. HEENT:      Head: Normocephalic and atraumatic.         Eyes: Conjunctivae are normal. Sclera is non-icteric.       Mouth/Throat: Mucous membranes are moist.       Neck: Supple with no signs of meningismus. Cardiovascular: Regular rate and rhythm. No murmurs, gallops, or rubs.  Respiratory: Normal respiratory effort. Lungs are clear to auscultation bilaterally.  Gastrointestinal: Soft, non tender, and non distended with positive bowel sounds. No rebound or guarding. Genitourinary: No CVA tenderness. Musculoskeletal: Nontender with normal range of motion in all extremities. No edema, cyanosis, or erythema of extremities. Neurologic: Normal speech and language. Face is symmetric.  Intact strength and sensation x4 Skin: Skin is warm, dry and intact. No rash noted. Psychiatric:  Mood and affect are normal. Speech and behavior are normal.  ____________________________________________   LABS (all labs ordered are listed, but only abnormal results are displayed)  Labs Reviewed  URINALYSIS, COMPLETE (UACMP) WITH MICROSCOPIC - Abnormal; Notable for the following components:      Result Value   Color, Urine AMBER (*)    APPearance CLEAR (*)    Hgb urine dipstick SMALL (*)    Nitrite POSITIVE (*)    Leukocytes,Ua TRACE (*)    Bacteria, UA RARE (*)    All other components within normal limits  BASIC METABOLIC PANEL - Abnormal; Notable for the following components:   Calcium 8.5 (*)    Anion gap 3 (*)    All other components within normal limits  CBC - Abnormal; Notable for the following components:   Hemoglobin 11.7 (*)  All other components within normal limits  POC URINE PREG, ED  POCT PREGNANCY, URINE   ____________________________________________  EKG  none  ____________________________________________  RADIOLOGY  I have personally reviewed the images performed during this visit and I agree with the Radiologist's read.   Interpretation by Radiologist:  DG Tibia/Fibula Left  Result Date: 06/14/2020 CLINICAL DATA:  Leg paresthesias. EXAM: LEFT TIBIA AND FIBULA - 2 VIEW COMPARISON:  None. FINDINGS: There is no evidence of fracture or other focal bone lesions. Soft tissues are unremarkable. IMPRESSION: Negative. Electronically Signed   By: Logan Bores M.D.   On: 06/14/2020 05:02   US Venous Img Lower Unilateral Left  Result Date: 06/14/2020 CLINICAL DATA:  Paresthesias for 3 days. EXAM: LEFT LOWER EXTREMITY VENOUS DOPPLER ULTRASOUND TECHNIQUE: Gray-scale sonography with compression, as well as color and duplex ultrasound, were performed to evaluate the deep venous system(s) from the level of the common femoral vein through the popliteal and proximal calf veins. COMPARISON:  None. FINDINGS: VENOUS Normal compressibility of the common femoral,  superficial femoral, and popliteal veins, as well as the visualized calf veins. Visualized portions of profunda femoral vein and great saphenous vein unremarkable. No filling defects to suggest DVT on grayscale or color Doppler imaging. Doppler waveforms show normal direction of venous flow, normal respiratory plasticity and response to augmentation. Limited views of the contralateral common femoral vein are unremarkable. OTHER None. Limitations: none IMPRESSION: Negative. Electronically Signed   By: Logan Bores M.D.   On: 06/14/2020 05:01   CT Renal Stone Study  Result Date: 06/14/2020 CLINICAL DATA:  49 year old female with left flank and pelvic pain. EXAM: CT ABDOMEN AND PELVIS WITHOUT CONTRAST TECHNIQUE: Multidetector CT imaging of the abdomen and pelvis was performed following the standard protocol without IV contrast. COMPARISON:  Noncontrast CT Abdomen and Pelvis 04/28/2020 and earlier. FINDINGS: Lower chest: Negative. Hepatobiliary: Chronically absent gallbladder. Stable and negative noncontrast liver. Pancreas: Stable pancreas with some head and anterior body atrophy. Spleen: Stable and negative aside from several punctate dystrophic calcifications. Adrenals/Urinary Tract: Normal adrenal glands. Capsular calcification of the right lateral kidney again noted. Unchanged punctate right nephrolithiasis. Fewer left renal mid to upper pole calculi compared to May. But no left hydronephrosis. Stable noncontrast left kidney otherwise. The left ureter appears stable and negative to the urinary bladder. Unchanged left hemipelvis phleboliths. The right ureter remains negative. Unremarkable urinary bladder. Stomach/Bowel: Increased low-density stool in the large bowel since May. Redundant transverse colon and splenic flexure. No large bowel inflammation. Normal appendix on series 2, image 69. Negative terminal ileum. No dilated small bowel. Stable prior gastric bypass. No free air or free fluid.  Vascular/Lymphatic: Normal caliber abdominal aorta. Vascular patency is not evaluated in the absence of IV contrast. Reproductive: Stable IUD.  Stable noncontrast appearance. Other: No pelvic free fluid. Musculoskeletal: No acute osseous abnormality identified. IMPRESSION: 1. Evidence of a passed left renal stone since May, but no acute ureteral calculus or obstructive uropathy. 2. Otherwise stable non-contrast abdomen and pelvis. Electronically Signed   By: Genevie Ann M.D.   On: 06/14/2020 03:52     ____________________________________________   PROCEDURES  Procedure(s) performed:yes .1-3 Lead EKG Interpretation Performed by: Rudene Re, MD Authorized by: Rudene Re, MD     Interpretation: normal     ECG rate assessment: normal     Rhythm: sinus rhythm     Ectopy: none     Critical Care performed:  None ____________________________________________   INITIAL IMPRESSION / ASSESSMENT AND PLAN / ED COURSE  49 y.o. female history of recurrent UTIs, fibrosis of the uterus, hypertension, kidney stones who presents for evaluation of left flank pain and suprapubic pain which have now resolved.  Patient is also complaining of 2 days of constant pins-and-needles sensation to the anterior shin on the left with no pain or swelling and no back pain.  Patient is well-appearing in no distress with normal vital signs, abdomen is soft with no tenderness throughout, no CVA tenderness.  Neuro exam is intact.  DP and PT pulses are strong and normal on bilateral lower extremities, palpation of the bones showing no abnormalities.  abd pain/ flank pain: ddx pyelonephritis versus kidney stone versus UTI.  UA is positive for urinary tract infection.  No signs of sepsis.  CT renal with no evidence of ureteral stone, confirmed by radiology.  Patient has allergies to cephalosporins and sulfa therefore will give a dose of IV Zosyn and discharged home on augmentin.  Shin paresthesias: no signs of  trauma or bony abnormalities. No h/o diabetes or neuropathy. No tenderness to palpation. Normal neurovascular exam. Doppler ordered to eval for DVT. XR ordered to eval for fracture or osteo.  History gathered from patient her husband was at bedside.  Work-up discussed with both of them.  Old medical records reviewed.  _________________________ 5:07 AM on 06/14/2020 -----------------------------------------  X-ray with no acute abnormalities, confirmed by radiology.  Doppler ultrasound with no evidence of DVT, confirmed by radiology.  Patient remained stable with no further episodes of pain.  Will discharge home on Augmentin and follow-up with PCP.  Discussed my standard return precautions.      _____________________________________________ Please note:  Patient was evaluated in Emergency Department today for the symptoms described in the history of present illness. Patient was evaluated in the context of the global COVID-19 pandemic, which necessitated consideration that the patient might be at risk for infection with the SARS-CoV-2 virus that causes COVID-19. Institutional protocols and algorithms that pertain to the evaluation of patients at risk for COVID-19 are in a state of rapid change based on information released by regulatory bodies including the CDC and federal and state organizations. These policies and algorithms were followed during the patient's care in the ED.  Some ED evaluations and interventions may be delayed as a result of limited staffing during the pandemic.   [place] Controlled Substance Database was reviewed by me. ____________________________________________   FINAL CLINICAL IMPRESSION(S) / ED DIAGNOSES   Final diagnoses:  Acute cystitis with hematuria  Paresthesia of lower limb      NEW MEDICATIONS STARTED DURING THIS VISIT:  ED Discharge Orders         Ordered    amoxicillin-clavulanate (AUGMENTIN) 875-125 MG tablet  2 times daily     Discontinue  Reprint      06/14/20 0507           Note:  This document was prepared using Dragon voice recognition software and may include unintentional dictation errors.    Alfred Levins, [place], MD 06/14/20 [phone]

## 2020-06-14 NOTE — ED Triage Notes (Signed)
Patient c/o left flank pain and pelvic pain. Patient reports interrupted urinary stream.

## 2020-06-14 NOTE — ED Notes (Signed)
Ultrasound at bedside

## 2020-06-14 NOTE — ED Notes (Signed)
Patient reports history of kidney stones and uti's.  Reports pain to left flank and left groin.

## 2020-07-02 ENCOUNTER — Other Ambulatory Visit: Payer: Self-pay | Admitting: Nurse Practitioner

## 2020-07-02 DIAGNOSIS — R29898 Other symptoms and signs involving the musculoskeletal system: Secondary | ICD-10-CM

## 2020-07-02 DIAGNOSIS — M5442 Lumbago with sciatica, left side: Secondary | ICD-10-CM

## 2020-07-02 DIAGNOSIS — M5416 Radiculopathy, lumbar region: Secondary | ICD-10-CM

## 2020-07-02 DIAGNOSIS — G8929 Other chronic pain: Secondary | ICD-10-CM

## 2020-07-14 ENCOUNTER — Ambulatory Visit: Payer: BC Managed Care – PPO | Admitting: Physician Assistant

## 2020-07-15 ENCOUNTER — Ambulatory Visit
Admission: RE | Admit: 2020-07-15 | Discharge: 2020-07-15 | Disposition: A | Discharge status: Home / Self Care | Source: Ambulatory Visit | Attending: Nurse Practitioner | Admitting: Nurse Practitioner

## 2020-07-15 ENCOUNTER — Other Ambulatory Visit: Payer: Self-pay

## 2020-07-15 DIAGNOSIS — R29898 Other symptoms and signs involving the musculoskeletal system: Secondary | ICD-10-CM

## 2020-07-15 DIAGNOSIS — M5416 Radiculopathy, lumbar region: Secondary | ICD-10-CM

## 2020-07-15 DIAGNOSIS — M5442 Lumbago with sciatica, left side: Secondary | ICD-10-CM

## 2020-07-15 DIAGNOSIS — G8929 Other chronic pain: Secondary | ICD-10-CM | POA: Diagnosis present

## 2020-07-16 ENCOUNTER — Ambulatory Visit: Payer: BC Managed Care – PPO | Admitting: Urology

## 2020-07-25 ENCOUNTER — Ambulatory Visit: Payer: BC Managed Care – PPO

## 2020-07-25 ENCOUNTER — Other Ambulatory Visit: Payer: BC Managed Care – PPO

## 2020-07-28 ENCOUNTER — Other Ambulatory Visit: Payer: Self-pay | Admitting: Nurse Practitioner

## 2020-07-28 DIAGNOSIS — G950 Syringomyelia and syringobulbia: Secondary | ICD-10-CM

## 2020-08-11 ENCOUNTER — Telehealth: Payer: Self-pay | Admitting: Urology

## 2020-08-11 NOTE — Telephone Encounter (Signed)
Would you call Mrs. Crimi and have her schedule an appointment with me or Dr. Bernardo Heater for follow up after she had a MRI on her spine and some areas of concern were seen in the kidneys?

## 2020-08-11 NOTE — Telephone Encounter (Signed)
-----   Message from Lonell Face, NP sent at 08/11/2020  4:45 PM EDT ----- Regarding: RE: MRI findings Hello, Yes, that would be great, thank you! ----- Message ----- From: [name], PA-C Sent: 08/11/2020   3:51 PM EDT To: Lonell Face, NP Subject: RE: MRI findings                               Hello [name],  We should have her follow up with [name] for this lesion.  Would you like [name] to call her and get her scheduled?  Zara Council, PA-C  ----- Message ----- From: Lonell Face, NP Sent: 08/11/2020   3:35 PM EDT To: [name], PA-C Subject: MRI findings                                   Hello [name],   We are seeing [name] for her complaints of neck and lower back pain, and while we were imaging her lower back, these results came up:  "Approximate 1 cm T1 hyperintense lesion within the interpolar  right kidney, indeterminate, but could reflect a small proteinaceous  and/or hemorrhagic cyst. Follow-up examination with dedicated renal  mass protocol CT and/or MRI suggested for further evaluation".   As kidneys are not my area of comfort, I just wanted to let you know these findings to see if he needed to follow-up with her. Thank you so much and have a wonderful day.   Lonell Face, NP  [facility]surgery

## 2020-08-13 NOTE — Telephone Encounter (Signed)
LMOM to return call to schedule an appointment.

## 2020-08-17 ENCOUNTER — Ambulatory Visit
Admission: RE | Admit: 2020-08-17 | Discharge: 2020-08-17 | Disposition: A | Discharge status: Home / Self Care | Payer: BC Managed Care – PPO | Source: Ambulatory Visit | Attending: Nurse Practitioner | Admitting: Nurse Practitioner

## 2020-08-17 ENCOUNTER — Other Ambulatory Visit: Payer: Self-pay

## 2020-08-17 DIAGNOSIS — G950 Syringomyelia and syringobulbia: Secondary | ICD-10-CM | POA: Diagnosis not present

## 2020-08-17 MED ORDER — GADOBUTROL 1 MMOL/ML IV SOLN
8.0000 mL | Freq: Once | INTRAVENOUS | Status: AC | PRN
  Administered 2020-08-17: 8 mL via INTRAVENOUS

## 2020-09-02 NOTE — Telephone Encounter (Signed)
Mailbox is full. Can't LMOM.

## 2020-09-03 DIAGNOSIS — N289 Disorder of kidney and ureter, unspecified: Secondary | ICD-10-CM

## 2020-09-03 HISTORY — DX: Disorder of kidney and ureter, unspecified: N28.9

## 2020-09-05 ENCOUNTER — Ambulatory Visit: Payer: Self-pay

## 2020-09-06 NOTE — Telephone Encounter (Signed)
Attempted to contact patient to schedule an appointment - mail box is full and cannot accept messages.    Scheduled appointment for 09/16/20 at 10:30am with Dr. Bernardo Heater.  Sent mychart message to patient to confirm.

## 2020-09-15 NOTE — Progress Notes (Signed)
09/16/2020 10:39 AM   Natasha Estrada 03, 1972 [id number]  Referring provider: Wayland Denis, PA-C [address] [place],  [place] [address] Chief Complaint  Patient presents with  . Other    HPI: Natasha Estrada is a 49 y.o. female who returns for an annual follow up of recurrent UTI.   -Refer to initial consult note from 09/12/2019 for details. -Cystoscopy on 05/31/2020 was negative. -She has passed 1 stone since last visit. -During stone passing episode she had flank, back and lower pelvic pain.  -CT renal stone study from 06/14/2020 noted evidence of a passed left renal stone since May, but no acute ureteral calculus or obstructive uropathy. -Lumbar MRI on 07/15/2020 approximate 1 cm T1 hyperintense lesion within the interpolar right kidney, indeterminate, but could reflect a small proteinaceous and/or hemorrhagic cyst. -Patient feels like she has a UTI.  -She reports some back pain, urgency and frequency. -She had not been drinking a lot of water.  PMH: Past Medical History:  Diagnosis Date  . Anxiety   . Depression   . Fibroid   . GERD (gastroesophageal reflux disease)   . Hypertension   . Insomnia   . Kidney stone   . Urinary tract infection     Surgical History: Past Surgical History:  Procedure Laterality Date  . GASTRIC ROUX-EN-Y    . ROTATOR CUFF REPAIR      Home Medications:  Allergies as of 09/16/2020      Reactions   Cefuroxime Shortness Of Breath   Tolerates Augmentin per husband Joe   Sulfa Antibiotics Rash      Medication List       Accurate as of September 16, 2020 10:39 AM. If you have any questions, ask your nurse or doctor.        buPROPion 150 MG 24 hr tablet Commonly known as: WELLBUTRIN XL Take 150 mg by mouth daily.   busPIRone 7.5 MG tablet Commonly known as: BUSPAR Take 7.5 mg by mouth 2 (two) times daily.   cyclobenzaprine 10 MG tablet Commonly known as: FLEXERIL Take 10 mg by mouth at bedtime.     DULoxetine 60 MG capsule Commonly known as: CYMBALTA Take 60 mg by mouth daily.   fexofenadine-pseudoephedrine 60-120 MG 12 hr tablet Commonly known as: ALLEGRA-D Take 1 tablet by mouth 2 (two) times daily.   gabapentin 300 MG capsule Commonly known as: NEURONTIN Take 600 mg by mouth at bedtime. What changed: Another medication with the same name was removed. Continue taking this medication, and follow the directions you see here. Changed by: Abbie Sons, MD   levonorgestrel 20 MCG/24HR IUD Commonly known as: MIRENA by Intrauterine route.   linaclotide 145 MCG Caps capsule Commonly known as: LINZESS Take 145 mcg by mouth daily before breakfast.   losartan 25 MG tablet Commonly known as: COZAAR Take 25 mg by mouth daily.   omeprazole 20 MG capsule Commonly known as: PRILOSEC Take 20 mg by mouth daily.   propranolol 40 MG tablet Commonly known as: INDERAL TAKE 1 TABLET(40 MG) BY MOUTH TWICE DAILY AS NEEDED. PATIENT TAKE AS NEEDED   zolpidem 10 MG tablet Commonly known as: AMBIEN Take 10 mg by mouth at bedtime as needed for sleep.       Allergies:  Allergies  Allergen Reactions  . Cefuroxime Shortness Of Breath    Tolerates Augmentin per husband Joe  . Sulfa Antibiotics Rash    Family History: History reviewed. No pertinent family history.  Social History:  reports that she has never smoked. She has never used smokeless tobacco. She reports previous alcohol use. She reports that she does not use drugs.   Physical Exam: BP 107/76   Pulse 78   Ht 5\' 5"  (1.651 m)   Wt 175 lb (79.4 kg)   BMI 29.12 kg/m   Constitutional:  Alert and oriented, No acute distress. HEENT: [place] AT, moist mucus membranes.  Trachea midline, no masses. Cardiovascular: No clubbing, cyanosis, or edema. Respiratory: Normal respiratory effort, no increased work of breathing. Skin: No rashes, bruises or suspicious lesions. Neurologic: Grossly intact, no focal deficits, moving all 4  extremities. Psychiatric: Normal mood and affect.  Laboratory Data:  Lab Results  Component Value Date   CREATININE 1.00 06/14/2020    Urinalysis Dipstick nitrite +1+ leukocytes Microscopy >30 WBC, many bacteria   Assessment & Plan:    1. Recurrent UTI Urinalysis with pyuria and nitrite positive Urine culture ordered Rx Macrobid sent to pharmacy After completion of antibiotic course will start low-dose antibiotic suppression Cysto from 06/28/2021showed no abnormalities.   2. History of nephrolithiasis Pt had 1 stone episode. CT from 06/14/2020 noted evidence of a passed left renal stone since May, but no acute ureteral calculus or obstructive uropathy.  3. Possible small proteinaceous/hemorrhagic cyst. Incidental finding found on recent Lumbar MRI in 07/2020.  Recommend renal mass protocol MRI  Due to small size imaging can be delayed 6 months however they would like to go ahead and schedule.  If this is consistent with a Bosniak 2 cyst no further imaging will be needed   [facility] [address] [place],  [address] [phone]  I, Selena Batten, am acting as a scribe for Dr. Nicki Reaper C. Danaye Sobh,  I have reviewed the above documentation for accuracy and completeness, and I agree with the above.   Abbie Sons, MD

## 2020-09-16 ENCOUNTER — Ambulatory Visit: Payer: BC Managed Care – PPO | Admitting: Urology

## 2020-09-16 ENCOUNTER — Encounter: Payer: Self-pay | Admitting: Urology

## 2020-09-16 ENCOUNTER — Other Ambulatory Visit: Payer: Self-pay

## 2020-09-16 VITALS — BP 107/76 | HR 78 | Ht 65.0 in | Wt 175.0 lb

## 2020-09-16 DIAGNOSIS — N390 Urinary tract infection, site not specified: Secondary | ICD-10-CM

## 2020-09-16 DIAGNOSIS — N200 Calculus of kidney: Secondary | ICD-10-CM

## 2020-09-16 DIAGNOSIS — N2889 Other specified disorders of kidney and ureter: Secondary | ICD-10-CM

## 2020-09-16 LAB — URINALYSIS, COMPLETE
Bilirubin, UA: NEGATIVE
Glucose, UA: NEGATIVE
Ketones, UA: NEGATIVE
Nitrite, UA: POSITIVE — AB
Protein,UA: NEGATIVE
Specific Gravity, UA: 1.02 (ref 1.005–1.030)
Urobilinogen, Ur: 0.2 mg/dL (ref 0.2–1.0)
pH, UA: 6 (ref 5.0–7.5)

## 2020-09-16 LAB — MICROSCOPIC EXAMINATION: WBC, UA: >30 /hpf — AB (ref 0–5)

## 2020-09-17 ENCOUNTER — Encounter: Payer: Self-pay | Admitting: Urology

## 2020-09-17 ENCOUNTER — Telehealth: Payer: Self-pay | Admitting: Family Medicine

## 2020-09-17 MED ORDER — NITROFURANTOIN MONOHYD MACRO 100 MG PO CAPS
100.0000 mg | ORAL_CAPSULE | Freq: Two times a day (BID) | ORAL | 0 refills | Status: DC
Start: 2020-09-17 — End: 2020-09-17

## 2020-09-17 MED ORDER — NITROFURANTOIN MONOHYD MACRO 100 MG PO CAPS
100.0000 mg | ORAL_CAPSULE | Freq: Two times a day (BID) | ORAL | 0 refills | Status: DC
Start: 2020-09-17 — End: 2020-09-30

## 2020-09-17 NOTE — Telephone Encounter (Signed)
-----   Message from Abbie Sons, MD sent at 09/17/2020  9:12 AM EDT ----- Urinalysis is consistent with infection.  A urine culture was ordered.  I went ahead and sent in an antibiotic to pharmacy, it is possible it may need to be changed when culture report returns next week

## 2020-09-17 NOTE — Telephone Encounter (Signed)
Patient notified and voiced understanding.

## 2020-09-19 ENCOUNTER — Other Ambulatory Visit: Payer: Self-pay | Admitting: Urology

## 2020-09-19 LAB — CULTURE, URINE COMPREHENSIVE

## 2020-09-19 MED ORDER — TRIMETHOPRIM 100 MG PO TABS: 100.0000 mg | ORAL_TABLET | Freq: Every day | ORAL | 2 refills | Status: DC

## 2020-09-19 MED ORDER — AMOXICILLIN 875 MG PO TABS
875.0000 mg | ORAL_TABLET | Freq: Two times a day (BID) | ORAL | 0 refills | Status: DC
Start: 2020-09-19 — End: 2020-09-30

## 2020-09-20 ENCOUNTER — Telehealth: Payer: Self-pay

## 2020-09-20 NOTE — Telephone Encounter (Signed)
Called pt informed her of the information below. Pt gave verbal understanding.  

## 2020-09-20 NOTE — Telephone Encounter (Signed)
-----   Message from Abbie Sons, MD sent at 09/19/2020  1:32 PM EDT ----- Urine culture was positive and intermediate to Macrobid.  Rx amoxicillin was sent to pharmacy.  A second low-dose antibiotic trimethoprim was also sent to pharmacy which she can start after she completes the amoxicillin course.

## 2020-09-29 ENCOUNTER — Telehealth: Payer: Self-pay

## 2020-09-29 NOTE — Telephone Encounter (Signed)
Patient called stating that she has completed her course of abx but is still having dysuria and flank pain. Her pain last night was severe, denies fever,chills nausea or vomiting. She is not able to come today for urine recheck but was made an apt for tomorrow with PA for further evaluation

## 2020-09-29 NOTE — Progress Notes (Signed)
09/16/2020 5:49 PM   Natasha Estrada 10-10-1971 [id number]  Referring provider: Wayland Denis, PA-C [address] [place],  [place] [id number] Chief Complaint  Patient presents with  . Hematuria    HPI: Natasha Estrada is a 49 y.o. female who returns for recurrent UTI.   -Refer to initial consult note from 09/12/2019 for details. -Cystoscopy on 05/31/2020 was negative. -She has passed 1 stone since last visit. -During stone passing episode she had flank, back and lower pelvic pain.  -CT renal stone study from 06/14/2020 noted evidence of a passed left renal stone since May, but no acute ureteral calculus or obstructive uropathy. -Lumbar MRI on 07/15/2020 approximate 1 cm T1 hyperintense lesion within the interpolar right kidney, indeterminate, but could reflect a small proteinaceous and/or hemorrhagic cyst.  Today, she is having intense right sided flank pain that is radiating into her right waist.  The pain was so severe two days ago, she almost went to the ED.  She has had some very dark urine.  She is having associated nausea.  Patient denies any modifying or aggravating factors.  Patient denies suprapubic.  Patient denies any fevers, chills or vomiting.   UA is unremarkable.  PVR is 25 mL.     PMH: Past Medical History:  Diagnosis Date  . Anxiety   . Depression   . Fibroid   . GERD (gastroesophageal reflux disease)   . Hypertension   . Insomnia   . Kidney stone   . Urinary tract infection     Surgical History: Past Surgical History:  Procedure Laterality Date  . GASTRIC ROUX-EN-Y    . ROTATOR CUFF REPAIR      Home Medications:  Allergies as of 09/30/2020      Reactions   Cefuroxime Shortness Of Breath   Tolerates Augmentin per husband Natasha Estrada   Sulfa Antibiotics Rash      Medication List       Accurate as of September 30, 2020 11:59 PM. If you have any questions, ask your nurse or doctor.        STOP taking these medications    amoxicillin 875 MG tablet Commonly known as: AMOXIL Stopped by: Natasha Gossen, PA-C   nitrofurantoin (macrocrystal-monohydrate) 100 MG capsule Commonly known as: MACROBID Stopped by: Natasha Council, PA-C     TAKE these medications   buPROPion 150 MG 24 hr tablet Commonly known as: WELLBUTRIN XL Take 150 mg by mouth daily.   busPIRone 7.5 MG tablet Commonly known as: BUSPAR Take 7.5 mg by mouth 2 (two) times daily.   cyclobenzaprine 10 MG tablet Commonly known as: FLEXERIL Take 10 mg by mouth at bedtime.   DULoxetine 60 MG capsule Commonly known as: CYMBALTA Take 60 mg by mouth daily.   fexofenadine-pseudoephedrine 60-120 MG 12 hr tablet Commonly known as: ALLEGRA-D Take 1 tablet by mouth 2 (two) times daily.   gabapentin 300 MG capsule Commonly known as: NEURONTIN Take 600 mg by mouth at bedtime.   gabapentin 100 MG capsule Commonly known as: NEURONTIN Take by mouth.   HYDROcodone-acetaminophen 5-325 MG tablet Commonly known as: NORCO/VICODIN Take 1 tablet by mouth every 6 (six) hours as needed for moderate pain. Started by: Natasha Council, PA-C   levonorgestrel 20 MCG/24HR IUD Commonly known as: MIRENA by Intrauterine route.   linaclotide 145 MCG Caps capsule Commonly known as: LINZESS Take 145 mcg by mouth daily before breakfast.   losartan 25 MG tablet Commonly known as: COZAAR Take 25 mg by mouth  daily.   omeprazole 20 MG capsule Commonly known as: PRILOSEC Take 20 mg by mouth daily.   propranolol 40 MG tablet Commonly known as: INDERAL TAKE 1 TABLET(40 MG) BY MOUTH TWICE DAILY AS NEEDED. PATIENT TAKE AS NEEDED   tamsulosin 0.4 MG Caps capsule Commonly known as: FLOMAX Take 1 capsule (0.4 mg total) by mouth daily. Started by: Natasha Council, PA-C   trimethoprim 100 MG tablet Commonly known as: TRIMPEX Take 1 tablet (100 mg total) by mouth daily.   zolpidem 10 MG tablet Commonly known as: AMBIEN Take 10 mg by mouth at bedtime as  needed for sleep.       Allergies:  Allergies  Allergen Reactions  . Cefuroxime Shortness Of Breath    Tolerates Augmentin per husband Natasha Estrada  . Sulfa Antibiotics Rash    Family History: No family history on file.  Social History:  reports that she has never smoked. She has never used smokeless tobacco. She reports previous alcohol use. She reports that she does not use drugs.   Physical Exam: BP 126/89   Pulse 82   Wt 175 lb (79.4 kg)   BMI 29.12 kg/m   Constitutional:  Well nourished. Alert and oriented, No acute distress.  Appears uncomfortable.   HEENT: [place] AT, mask in place.  Trachea midline, no masses. Cardiovascular: No clubbing, cyanosis, or edema. Respiratory: Normal respiratory effort, no increased work of breathing. GU: Right CVA tenderness.  No bladder fullness or masses.   Neurologic: Grossly intact, no focal deficits, moving all 4 extremities. Psychiatric: Normal mood and affect.   Laboratory Data:  Lab Results  Component Value Date   CREATININE 1.00 06/14/2020    Urinalysis Component     Latest Ref Rng & Units 09/30/2020  Specific Gravity, UA     1.005 - 1.030 1.010  pH, UA     5.0 - 7.5 5.5  Color, UA     Yellow Yellow  Appearance Ur     Clear Hazy (A)  Leukocytes,UA     Negative Trace (A)  Protein,UA     Negative/Trace Negative  Glucose, UA     Negative Negative  Ketones, UA     Negative Negative  RBC, UA     Negative Trace (A)  Bilirubin, UA     Negative Negative  Urobilinogen, Ur     0.2 - 1.0 mg/dL 0.2  Nitrite, UA     Negative Negative  Microscopic Examination      See below:   Component     Latest Ref Rng & Units 09/30/2020  WBC, UA     0 - 5 /hpf 0-5  RBC     0 - 2 /hpf 0-2  Epithelial Cells (non renal)     0 - 10 /hpf 0-10  Bacteria, UA     None seen/Few Few   I have reviewed the labs.  Assessment & Plan:    1. Right flank pain Will obtain a STAT CT Renal stone study Start tamsulosin 0.4 mg daily  Given 5  tablets of Vicodin 5/325 for pain Reviewed red flag signs and instructed to seek treatment in the emergency room tonight if she should experience any of these  2. Recurrent UTI Just completed antibiotic and no symptoms of a lower urinary tract infection and UA is clear She has not yet started her antibiotic suppression  Cysto from 06/28/2021showed no abnormalities.   3. History of nephrolithiasis Pt had 1 stone episode. CT from 06/14/2020 noted evidence of  a passed left renal stone since May, but no acute ureteral calculus or obstructive uropathy.  4. Possible small proteinaceous/hemorrhagic cyst. Incidental finding found on recent Lumbar MRI in 07/2020.  Renal mass protocol MRI scheduled for 10/14/2020  [facility] [address], [place], [place] [address] [phone]   Natasha Council, PA-C

## 2020-09-30 ENCOUNTER — Ambulatory Visit: Payer: BC Managed Care – PPO | Admitting: Urology

## 2020-09-30 ENCOUNTER — Other Ambulatory Visit: Payer: Self-pay

## 2020-09-30 VITALS — BP 126/89 | HR 82 | Wt 175.0 lb

## 2020-09-30 DIAGNOSIS — N2889 Other specified disorders of kidney and ureter: Secondary | ICD-10-CM | POA: Diagnosis not present

## 2020-09-30 DIAGNOSIS — R109 Unspecified abdominal pain: Secondary | ICD-10-CM | POA: Diagnosis not present

## 2020-09-30 DIAGNOSIS — N390 Urinary tract infection, site not specified: Secondary | ICD-10-CM

## 2020-09-30 DIAGNOSIS — N200 Calculus of kidney: Secondary | ICD-10-CM

## 2020-09-30 LAB — BLADDER SCAN AMB NON-IMAGING: Scan Result: 25

## 2020-09-30 MED ORDER — HYDROCODONE-ACETAMINOPHEN 5-325 MG PO TABS: 1.0000 | ORAL_TABLET | Freq: Four times a day (QID) | ORAL | 0 refills | Status: DC | PRN

## 2020-09-30 MED ORDER — TAMSULOSIN HCL 0.4 MG PO CAPS: 0.4000 mg | ORAL_CAPSULE | Freq: Every day | ORAL | 0 refills | Status: DC

## 2020-10-01 ENCOUNTER — Ambulatory Visit
Admission: RE | Admit: 2020-10-01 | Discharge: 2020-10-01 | Disposition: A | Discharge status: Home / Self Care | Payer: BC Managed Care – PPO | Source: Ambulatory Visit | Attending: Urology | Admitting: Urology

## 2020-10-01 DIAGNOSIS — R109 Unspecified abdominal pain: Secondary | ICD-10-CM | POA: Diagnosis present

## 2020-10-01 HISTORY — PX: OTHER SURGICAL HISTORY: SHX169

## 2020-10-01 LAB — URINALYSIS, COMPLETE
Bilirubin, UA: NEGATIVE
Glucose, UA: NEGATIVE
Ketones, UA: NEGATIVE
Nitrite, UA: NEGATIVE
Protein,UA: NEGATIVE
Specific Gravity, UA: 1.01 (ref 1.005–1.030)
Urobilinogen, Ur: 0.2 mg/dL (ref 0.2–1.0)
pH, UA: 5.5 (ref 5.0–7.5)

## 2020-10-01 LAB — MICROSCOPIC EXAMINATION

## 2020-10-04 ENCOUNTER — Other Ambulatory Visit: Payer: Self-pay | Admitting: Surgery

## 2020-10-04 ENCOUNTER — Telehealth: Payer: Self-pay | Admitting: *Deleted

## 2020-10-04 DIAGNOSIS — N390 Urinary tract infection, site not specified: Secondary | ICD-10-CM

## 2020-10-04 HISTORY — DX: Urinary tract infection, site not specified: N39.0

## 2020-10-04 LAB — CULTURE, URINE COMPREHENSIVE

## 2020-10-04 NOTE — Telephone Encounter (Signed)
Spoke with patient and advised results   

## 2020-10-04 NOTE — Telephone Encounter (Signed)
-----   Message from [name], PA-C sent at 10/04/2020 10:24 AM EDT ----- Please let Mrs. Bardsley know that her urine culture was negative for infection.

## 2020-10-10 ENCOUNTER — Encounter: Payer: Self-pay | Admitting: Urology

## 2020-10-12 ENCOUNTER — Encounter
Admission: RE | Admit: 2020-10-12 | Discharge: 2020-10-12 | Disposition: A | Discharge status: Home / Self Care | Payer: BC Managed Care – PPO | Source: Ambulatory Visit | Attending: Surgery | Admitting: Surgery

## 2020-10-12 ENCOUNTER — Other Ambulatory Visit: Payer: Self-pay

## 2020-10-12 DIAGNOSIS — Z01818 Encounter for other preprocedural examination: Secondary | ICD-10-CM | POA: Diagnosis not present

## 2020-10-12 HISTORY — DX: Irritable bowel syndrome, unspecified: K58.9

## 2020-10-12 HISTORY — DX: Other chronic pain: G89.29

## 2020-10-12 HISTORY — DX: Radiculopathy, lumbar region: M54.16

## 2020-10-12 HISTORY — DX: Personal history of urinary calculi: Z87.442

## 2020-10-12 HISTORY — DX: Radiculopathy, cervical region: M54.12

## 2020-10-12 HISTORY — DX: Unspecified osteoarthritis, unspecified site: M19.90

## 2020-10-12 HISTORY — DX: Chronic pain syndrome: G89.4

## 2020-10-12 HISTORY — DX: Sleep apnea, unspecified: G47.30

## 2020-10-12 NOTE — Patient Instructions (Addendum)
INSTRUCTIONS FOR SURGERY     Your surgery is scheduled for:   Tuesday, November 16TH     To find out your arrival time for the day of surgery,          please call [phone] between 1 pm and 3 pm on :  Monday, November 15TH     When you arrive for surgery, report to the [facility]. GO TO REGISTRATION DESK      FIRST AND WHEN DONE THERE, GO TO THE SECOND FLOOR AND CHECK IN AT THE       SURGICAL DESK.        REMEMBER: Instructions that are not followed completely may result in serious medical risk,  up to and including death, or upon the discretion of your surgeon and anesthesiologist,            your surgery may need to be rescheduled.  __X__ 1. Do not eat food after midnight the night before your procedure.                    No gum, candy, lozenger, tic tacs, tums or hard candies.                  ABSOLUTELY NOTHING SOLID IN YOUR MOUTH AFTER MIDNIGHT                    You may drink unlimited clear liquids up to 2 hours before you are scheduled to arrive for surgery.                   Do not drink anything within those 2 hours unless you need to take medicine, then take the                   smallest amount you need.  Clear liquids include:  water, apple juice without pulp,                   any flavor Gatorade, Black coffee, black tea.  Sugar may be added but no dairy/ honey /lemon.                        Broth and jello is not considered a clear liquid.  __x__  2. On the morning of surgery, please brush your teeth with toothpaste and water. You may rinse with                  mouthwash if you wish but DO NOT SWALLOW TOOTHPASTE OR MOUTHWASH  __X___3. NO alcohol for 24 hours before or after surgery.  __x___ 4.  Do NOT smoke or use e-cigarettes for 24 HOURS PRIOR TO SURGERY.                      DO NOT Use any chewable tobacco products for at least 6 hours prior to surgery.  __x___ 5. If you start any new medication after  this appointment and prior to surgery, please                   Bring it with you  on the day of surgery.  ___x__ 6. Notify your doctor if there is any change in your medical condition, such as fever,                   infection, vomitting, diarrhea or any open sores.  __x___ 7.  USE the CHG SOAP as instructed, the night before surgery and the day of surgery.                   Once you have washed with this soap, do NOT use any of the following: Powders, perfumes                    or lotions. Please do not wear make up, hairpins, clips or nail polish. You MAY NOT wear deodorant.                   Men may shave their face and neck.  Women need to shave 48 hours prior to surgery.                   DO NOT wear ANY jewelry on the day of surgery. If there are rings that are too tight to                    remove easily, please address this prior to the surgery day. Piercings need to be removed.                                                                     NO METAL ON YOUR BODY.                     Do NOT bring any valuables.  You will need your  license, insurance card and a form of payment.                         [facility] IS NOT RESPONSIBLE FOR BELONGINGS OR VALUABLES.  ___X__ 8. DO NOT wear contact lenses on surgery day.  You may not have dentures,                     Hearing aides, contacts or glasses in the operating room. These items can be                    Placed in the Recovery Room to receive immediately after surgery.  __x___ 9. IF YOU ARE SCHEDULED TO GO HOME ON THE SAME DAY, YOU MUST                   Have someone to drive you home and to stay with you  for the first 24 hours.                    Have an arrangement prior to arriving on surgery day.  ___x__ 10. Take the following medications on the morning of surgery with a sip of water:                              1. PRILOSEC (take an extra dose on  the night before surgery)                     2. WELLBUTRIN                      3. BUSPAR                     4. CYMBALTA                     5. GABAPENTIN                     6. ALLEGRA-D  __X___ 11.  Follow any instructions provided to you by your surgeon.                        Such as enema, clear liquid bowel prep                         PLEASE DRINK ALL OF THE PRESURGICAL CARBOHYDRATE DRINK                           ON THE MORNING OF SURGERY. IT SHOULD BE COMPLETED BY                             2 HOURS PRIOR TO ARRIVAL TO THE HOSPITAL.  __X__  12. STOP ALL ASPIRIN PRODUCTS AS OF TODAY, 10/12/20                       THIS INCLUDES BC POWDERS / GOODIES POWDER  __x___ 13. STOP Anti-inflammatories as of TODAY, 10/12/20.                      This includes IBUPROFEN / MOTRIN / ADVIL / ALEVE/ NAPROXYN                    YOU MAY TAKE TYLENOL ANY TIME PRIOR TO SURGERY.  ___X__17.  Continue to take the following medications but do not take on the morning of surgery:                        LINZESS // LOSARTAN  ______18. Wear clean and comfortable clothing to the hospital.                   WEAR AN OVERSIZED SHIRT OR SOMETHING LOOSE.  CONTINUE TO TAKE YOUR NIGHT TIME MEDICINE AS USUAL.  MAKE SURE TO TAKE     YOUR INDERAL THE EVENING PRIOR TO SURGERY.  ADVANCE DIRECTIVE BOOKLETS ARE IN YOUR BAG.  COMPLETE AND HAVE NOTARIZED.   ONCE NOTARIZED, THIS IS A LEGALDOCUMENT.

## 2020-10-14 ENCOUNTER — Ambulatory Visit
Admission: RE | Admit: 2020-10-14 | Discharge: 2020-10-14 | Disposition: A | Discharge status: Home / Self Care | Payer: BC Managed Care – PPO | Source: Ambulatory Visit | Attending: Urology | Admitting: Urology

## 2020-10-14 ENCOUNTER — Other Ambulatory Visit: Payer: Self-pay

## 2020-10-14 DIAGNOSIS — N2889 Other specified disorders of kidney and ureter: Secondary | ICD-10-CM

## 2020-10-14 MED ORDER — GADOBUTROL 1 MMOL/ML IV SOLN
7.5000 mL | Freq: Once | INTRAVENOUS | Status: AC | PRN
  Administered 2020-10-14: 7.5 mL via INTRAVENOUS

## 2020-10-15 ENCOUNTER — Telehealth: Payer: Self-pay | Admitting: *Deleted

## 2020-10-15 ENCOUNTER — Other Ambulatory Visit
Admission: RE | Admit: 2020-10-15 | Discharge: 2020-10-15 | Disposition: A | Discharge status: Home / Self Care | Payer: BC Managed Care – PPO | Source: Ambulatory Visit | Attending: Surgery | Admitting: Surgery

## 2020-10-15 DIAGNOSIS — Z20822 Contact with and (suspected) exposure to covid-19: Secondary | ICD-10-CM | POA: Insufficient documentation

## 2020-10-15 DIAGNOSIS — Z01812 Encounter for preprocedural laboratory examination: Secondary | ICD-10-CM | POA: Insufficient documentation

## 2020-10-15 DIAGNOSIS — N2889 Other specified disorders of kidney and ureter: Secondary | ICD-10-CM

## 2020-10-15 NOTE — Telephone Encounter (Signed)
Pt calling asking about results of the MRI, pt can see results on mychart but doesn't understand, please advise

## 2020-10-15 NOTE — Telephone Encounter (Signed)
The small mass is most likely a benign cyst however due to the small size it is difficult to say for certain.  Would recommend a repeat MRI in 1 year to make sure there has been no change.  Order was entered

## 2020-10-15 NOTE — Telephone Encounter (Signed)
Patient notified and voiced understanding.

## 2020-10-16 LAB — SARS CORONAVIRUS 2 (TAT 6-24 HRS): SARS Coronavirus 2: NEGATIVE

## 2020-10-18 MED ORDER — LACTATED RINGERS IV SOLN: INTRAVENOUS | Status: DC

## 2020-10-18 MED ORDER — CHLORHEXIDINE GLUCONATE 0.12 % MT SOLN: 15.0000 mL | Freq: Once | OROMUCOSAL | Status: AC

## 2020-10-18 MED ORDER — ORAL CARE MOUTH RINSE: 15.0000 mL | Freq: Once | OROMUCOSAL | Status: AC

## 2020-10-18 MED ORDER — CLINDAMYCIN PHOSPHATE 900 MG/50ML IV SOLN
900.0000 mg | INTRAVENOUS | Status: AC
  Administered 2020-10-19: 900 mg via INTRAVENOUS

## 2020-10-19 ENCOUNTER — Inpatient Hospital Stay: Admitting: Certified Registered Nurse Anesthetist

## 2020-10-19 ENCOUNTER — Encounter
Admission: RE | Disposition: A | Discharge status: Home / Self Care | Payer: Self-pay | Source: Home / Self Care | Attending: Surgery

## 2020-10-19 ENCOUNTER — Inpatient Hospital Stay

## 2020-10-19 ENCOUNTER — Ambulatory Visit
Admission: RE | Admit: 2020-10-19 | Discharge: 2020-10-19 | Disposition: A | Discharge status: Home / Self Care | Attending: Surgery | Admitting: Surgery

## 2020-10-19 ENCOUNTER — Encounter: Payer: Self-pay | Admitting: Surgery

## 2020-10-19 DIAGNOSIS — Z419 Encounter for procedure for purposes other than remedying health state, unspecified: Secondary | ICD-10-CM

## 2020-10-19 DIAGNOSIS — M7542 Impingement syndrome of left shoulder: Secondary | ICD-10-CM | POA: Insufficient documentation

## 2020-10-19 DIAGNOSIS — M75112 Incomplete rotator cuff tear or rupture of left shoulder, not specified as traumatic: Secondary | ICD-10-CM | POA: Insufficient documentation

## 2020-10-19 DIAGNOSIS — M7582 Other shoulder lesions, left shoulder: Secondary | ICD-10-CM | POA: Insufficient documentation

## 2020-10-19 HISTORY — PX: SHOULDER ARTHROSCOPY WITH SUBACROMIAL DECOMPRESSION AND OPEN ROTATOR C: SHX5688

## 2020-10-19 LAB — POCT PREGNANCY, URINE: Preg Test, Ur: NEGATIVE

## 2020-10-19 SURGERY — SHOULDER ARTHROSCOPY WITH SUBACROMIAL DECOMPRESSION AND OPEN ROTATOR CUFF REPAIR, OPEN BICEPS TENDON REPAIR
Anesthesia: General | Site: Shoulder | Laterality: Left

## 2020-10-19 MED ORDER — POTASSIUM CHLORIDE IN NACL 20-0.9 MEQ/L-% IV SOLN: INTRAVENOUS | Status: DC

## 2020-10-19 MED ORDER — FENTANYL CITRATE (PF) 100 MCG/2ML IJ SOLN
25.0000 ug | INTRAMUSCULAR | Status: DC | PRN
  Administered 2020-10-19: 50 ug via INTRAVENOUS
  Administered 2020-10-19 (×2): 25 ug via INTRAVENOUS

## 2020-10-19 MED ORDER — EPINEPHRINE PF 1 MG/ML IJ SOLN
INTRAMUSCULAR | Status: DC | PRN
  Administered 2020-10-19: 1 mg

## 2020-10-19 MED ORDER — FENTANYL CITRATE (PF) 100 MCG/2ML IJ SOLN: 50.0000 ug | Freq: Once | INTRAMUSCULAR | Status: AC

## 2020-10-19 MED ORDER — FENTANYL CITRATE (PF) 100 MCG/2ML IJ SOLN
INTRAMUSCULAR | Status: AC
  Administered 2020-10-19: 50 ug via INTRAVENOUS
  Filled 2020-10-19: qty 2

## 2020-10-19 MED ORDER — ONDANSETRON HCL 4 MG/2ML IJ SOLN: 4.0000 mg | Freq: Once | INTRAMUSCULAR | Status: DC | PRN

## 2020-10-19 MED ORDER — SODIUM CHLORIDE 0.9 % IV SOLN
INTRAVENOUS | Status: DC | PRN
  Administered 2020-10-19: 30 ug/min via INTRAVENOUS

## 2020-10-19 MED ORDER — BUPIVACAINE HCL (PF) 0.5 % IJ SOLN
INTRAMUSCULAR | Status: AC
  Filled 2020-10-19: qty 10

## 2020-10-19 MED ORDER — ACETAMINOPHEN 10 MG/ML IV SOLN: 1000.0000 mg | Freq: Once | INTRAVENOUS | Status: DC | PRN

## 2020-10-19 MED ORDER — PROPOFOL 10 MG/ML IV BOLUS
INTRAVENOUS | Status: DC | PRN
  Administered 2020-10-19: 30 mg via INTRAVENOUS
  Administered 2020-10-19: 120 mg via INTRAVENOUS

## 2020-10-19 MED ORDER — SODIUM CHLORIDE FLUSH 0.9 % IV SOLN
INTRAVENOUS | Status: AC
  Filled 2020-10-19: qty 10

## 2020-10-19 MED ORDER — LIDOCAINE HCL (CARDIAC) PF 100 MG/5ML IV SOSY
PREFILLED_SYRINGE | INTRAVENOUS | Status: DC | PRN
  Administered 2020-10-19: 50 mg via INTRAVENOUS

## 2020-10-19 MED ORDER — SUGAMMADEX SODIUM 200 MG/2ML IV SOLN
INTRAVENOUS | Status: DC | PRN
  Administered 2020-10-19: 200 mg via INTRAVENOUS

## 2020-10-19 MED ORDER — ROCURONIUM BROMIDE 100 MG/10ML IV SOLN
INTRAVENOUS | Status: DC | PRN
  Administered 2020-10-19: 50 mg via INTRAVENOUS

## 2020-10-19 MED ORDER — OXYCODONE HCL 5 MG PO TABS: 5.0000 mg | ORAL_TABLET | Freq: Once | ORAL | Status: DC | PRN

## 2020-10-19 MED ORDER — FENTANYL CITRATE (PF) 100 MCG/2ML IJ SOLN
INTRAMUSCULAR | Status: AC
  Filled 2020-10-19: qty 2

## 2020-10-19 MED ORDER — MIDAZOLAM HCL 2 MG/2ML IJ SOLN
INTRAMUSCULAR | Status: AC
  Administered 2020-10-19: 1 mg via INTRAVENOUS
  Filled 2020-10-19: qty 2

## 2020-10-19 MED ORDER — CHLORHEXIDINE GLUCONATE 0.12 % MT SOLN
OROMUCOSAL | Status: AC
  Administered 2020-10-19: 15 mL via OROMUCOSAL
  Filled 2020-10-19: qty 15

## 2020-10-19 MED ORDER — BUPIVACAINE HCL (PF) 0.5 % IJ SOLN
INTRAMUSCULAR | Status: DC | PRN
  Administered 2020-10-19: 10 mL

## 2020-10-19 MED ORDER — PHENYLEPHRINE HCL (PRESSORS) 10 MG/ML IV SOLN
INTRAVENOUS | Status: DC | PRN
  Administered 2020-10-19: 100 ug via INTRAVENOUS

## 2020-10-19 MED ORDER — EPINEPHRINE PF 1 MG/ML IJ SOLN
INTRAMUSCULAR | Status: AC
  Filled 2020-10-19: qty 2

## 2020-10-19 MED ORDER — METOCLOPRAMIDE HCL 5 MG/ML IJ SOLN: 5.0000 mg | Freq: Three times a day (TID) | INTRAMUSCULAR | Status: DC | PRN

## 2020-10-19 MED ORDER — OXYCODONE HCL 5 MG/5ML PO SOLN: 5.0000 mg | Freq: Once | ORAL | Status: DC | PRN

## 2020-10-19 MED ORDER — ONDANSETRON HCL 4 MG/2ML IJ SOLN
INTRAMUSCULAR | Status: DC | PRN
  Administered 2020-10-19: 4 mg via INTRAVENOUS

## 2020-10-19 MED ORDER — MIDAZOLAM HCL 2 MG/2ML IJ SOLN: 1.0000 mg | Freq: Once | INTRAMUSCULAR | Status: AC

## 2020-10-19 MED ORDER — OXYCODONE HCL 5 MG PO TABS: 5.0000 mg | ORAL_TABLET | ORAL | Status: DC | PRN

## 2020-10-19 MED ORDER — ONDANSETRON HCL 4 MG PO TABS: 4.0000 mg | ORAL_TABLET | Freq: Four times a day (QID) | ORAL | Status: DC | PRN

## 2020-10-19 MED ORDER — BUPIVACAINE-EPINEPHRINE (PF) 0.5% -1:200000 IJ SOLN
INTRAMUSCULAR | Status: DC | PRN
  Administered 2020-10-19: 30 mL via PERINEURAL

## 2020-10-19 MED ORDER — PROPOFOL 10 MG/ML IV BOLUS
INTRAVENOUS | Status: AC
  Filled 2020-10-19: qty 20

## 2020-10-19 MED ORDER — BUPIVACAINE-EPINEPHRINE (PF) 0.5% -1:200000 IJ SOLN
INTRAMUSCULAR | Status: AC
  Filled 2020-10-19: qty 30

## 2020-10-19 MED ORDER — ONDANSETRON HCL 4 MG/2ML IJ SOLN: 4.0000 mg | Freq: Four times a day (QID) | INTRAMUSCULAR | Status: DC | PRN

## 2020-10-19 MED ORDER — OXYCODONE HCL 5 MG PO TABS
5.0000 mg | ORAL_TABLET | ORAL | 0 refills | Status: DC | PRN
Start: 2020-10-19 — End: 2020-12-07

## 2020-10-19 MED ORDER — DEXAMETHASONE SODIUM PHOSPHATE 10 MG/ML IJ SOLN
INTRAMUSCULAR | Status: DC | PRN
  Administered 2020-10-19: 10 mg via INTRAVENOUS

## 2020-10-19 MED ORDER — METOCLOPRAMIDE HCL 10 MG PO TABS: 5.0000 mg | ORAL_TABLET | Freq: Three times a day (TID) | ORAL | Status: DC | PRN

## 2020-10-19 MED ORDER — FENTANYL CITRATE (PF) 100 MCG/2ML IJ SOLN
INTRAMUSCULAR | Status: DC | PRN
  Administered 2020-10-19: 25 ug via INTRAVENOUS

## 2020-10-19 MED ORDER — CLINDAMYCIN PHOSPHATE 900 MG/50ML IV SOLN
INTRAVENOUS | Status: AC
  Filled 2020-10-19: qty 50

## 2020-10-19 MED ORDER — BUPIVACAINE LIPOSOME 1.3 % IJ SUSP
INTRAMUSCULAR | Status: DC | PRN
  Administered 2020-10-19: 10 mL

## 2020-10-19 MED ORDER — BUPIVACAINE LIPOSOME 1.3 % IJ SUSP
INTRAMUSCULAR | Status: AC
  Filled 2020-10-19: qty 20

## 2020-10-19 SURGICAL SUPPLY — 47 items
ANCHOR BONE REGENETEN (Anchor) ×2 IMPLANT
ANCHOR JUGGERKNOT WTAP NDL 2.9 (Anchor) ×4 IMPLANT
ANCHOR TENDON REGENETEN (Staple) ×2 IMPLANT
BIT DRILL JUGRKNT W/NDL BIT2.9 (DRILL) ×1 IMPLANT
BLADE FULL RADIUS 3.5 (BLADE) ×2 IMPLANT
BUR ACROMIONIZER 4.0 (BURR) ×2 IMPLANT
CANNULA SHAVER 8MMX76MM (CANNULA) ×2 IMPLANT
CHLORAPREP W/TINT 26 (MISCELLANEOUS) ×2 IMPLANT
COVER MAYO STAND REUSABLE (DRAPES) ×2 IMPLANT
COVER WAND RF STERILE (DRAPES) ×2 IMPLANT
DRAPE IMP U-DRAPE 54X76 (DRAPES) ×4 IMPLANT
DRILL JUGGERKNOT W/NDL BIT 2.9 (DRILL) ×2
ELECT CAUTERY BLADE 6.4 (BLADE) ×2 IMPLANT
ELECT REM PT RETURN 9FT ADLT (ELECTROSURGICAL) ×2
ELECTRODE REM PT RTRN 9FT ADLT (ELECTROSURGICAL) ×1 IMPLANT
GAUZE SPONGE 4X4 12PLY STRL (GAUZE/BANDAGES/DRESSINGS) ×2 IMPLANT
GAUZE XEROFORM 1X8 LF (GAUZE/BANDAGES/DRESSINGS) ×2 IMPLANT
GLOVE BIO SURGEON STRL SZ7.5 (GLOVE) ×4 IMPLANT
GLOVE BIO SURGEON STRL SZ8 (GLOVE) ×4 IMPLANT
GLOVE BIOGEL PI IND STRL 8 (GLOVE) ×1 IMPLANT
GLOVE BIOGEL PI INDICATOR 8 (GLOVE) ×1
GLOVE INDICATOR 8.0 STRL GRN (GLOVE) ×2 IMPLANT
GOWN STRL REUS W/ TWL LRG LVL3 (GOWN DISPOSABLE) ×1 IMPLANT
GOWN STRL REUS W/ TWL XL LVL3 (GOWN DISPOSABLE) ×1 IMPLANT
GOWN STRL REUS W/TWL LRG LVL3 (GOWN DISPOSABLE) ×1
GOWN STRL REUS W/TWL XL LVL3 (GOWN DISPOSABLE) ×1
GRASPER SUT 15 45D LOW PRO (SUTURE) IMPLANT
IMPL REGENETEN MEDIUM (Shoulder) ×1 IMPLANT
IMPLANT REGENETEN MEDIUM (Shoulder) ×2 IMPLANT
IV LACTATED RINGER IRRG 3000ML (IV SOLUTION) ×1
IV LR IRRIG 3000ML ARTHROMATIC (IV SOLUTION) ×1 IMPLANT
MANIFOLD NEPTUNE II (INSTRUMENTS) ×4 IMPLANT
MASK FACE SPIDER DISP (MASK) ×2 IMPLANT
MAT ABSORB  FLUID 56X50 GRAY (MISCELLANEOUS) ×1
MAT ABSORB FLUID 56X50 GRAY (MISCELLANEOUS) ×1 IMPLANT
PACK ARTHROSCOPY SHOULDER (MISCELLANEOUS) ×2 IMPLANT
PENCIL SMOKE EVACUATOR (MISCELLANEOUS) ×2 IMPLANT
SLING ARM LRG DEEP (SOFTGOODS) ×2 IMPLANT
SLING ULTRA II LG (MISCELLANEOUS) IMPLANT
STAPLER SKIN PROX 35W (STAPLE) ×2 IMPLANT
STRAP SAFETY 5IN WIDE (MISCELLANEOUS) ×2 IMPLANT
SUT ETHIBOND 0 MO6 C/R (SUTURE) ×2 IMPLANT
SUT VIC AB 2-0 CT1 27 (SUTURE) ×2
SUT VIC AB 2-0 CT1 TAPERPNT 27 (SUTURE) ×2 IMPLANT
TAPE MICROFOAM 4IN (TAPE) ×2 IMPLANT
TUBING ARTHRO INFLOW-ONLY STRL (TUBING) ×2 IMPLANT
WAND WEREWOLF FLOW 90D (MISCELLANEOUS) ×2 IMPLANT

## 2020-10-19 NOTE — Discharge Instructions (Addendum)
Orthopedic discharge instructions: Keep dressing dry and intact.  May shower after dressing changed on post-op day #4 (Saturday).  Cover staples with Band-Aids after drying off. Apply ice frequently to shoulder. Take ibuprofen 600-800 mg TID with meals for 7-10 days, then as necessary.   TID = 3 times per day (every 8 hrs) Take oxycodone as prescribed when needed.  May supplement with ES Tylenol if necessary. Keep shoulder immobilizer on at all times except may remove for bathing purposes. Follow-up in 10-14 days or as scheduled.      Interscalene Nerve Block with Exparel  1.  For your surgery you have received an Interscalene Nerve Block with Exparel. 2. Nerve Blocks affect many types of nerves, including nerves that control movement, pain and normal sensation.  You may experience feelings such as numbness, tingling, heaviness, weakness or the inability to move your arm or the feeling or sensation that your arm has "fallen asleep". 3. A nerve block with Exparel can last up to 5 days.  Usually the weakness wears off first.  The tingling and heaviness usually wear off next.  Finally you may start to notice pain.  Keep in mind that this may occur in any order.  Once a nerve block starts to wear off it is usually completely gone within 60 minutes. 4. ISNB may cause mild shortness of breath, a hoarse voice, blurry vision, unequal pupils, or drooping of the face on the same side as the nerve block.  These symptoms will usually resolve with the numbness.  Very rarely the procedure itself can cause mild seizures. 5. If needed, your surgeon will give you a prescription for pain medication.  It will take about 60 minutes for the oral pain medication to become fully effective.  So, it is recommended that you start taking this medication before the nerve block first begins to wear off, or when you first begin to feel discomfort. 6. Take your pain medication only as prescribed.  Pain medication can cause  sedation and decrease your breathing if you take more than you need for the level of pain that you have. 7. Nausea is a common side effect of many pain medications.  You may want to eat something before taking your pain medicine to prevent nausea. 8. After an Interscalene nerve block, you cannot feel pain, pressure or extremes in temperature in the effected arm.  Because your arm is numb it is at an increased risk for injury.  To decrease the possibility of injury, please practice the following:  a. While you are awake change the position of your arm frequently to prevent too much pressure on any one area for prolonged periods of time. b.  If you have a cast or tight dressing, check the color or your fingers every couple of hours.  Call your surgeon with the appearance of any discoloration (white or blue). c. If you are given a sling to wear before you go home, please wear it  at all times until the block has completely worn off.  Do not get up at night without your sling. d. Please contact [facility] or your surgeon if you do not begin to regain sensation after 7 days from the surgery.  Anesthesia may be contacted by calling the Same Day Surgery Department, Mon. through Fri., 6 am to 4 pm at [phone].   e. If you experience any other problems or concerns, please contact your surgeon's office. f. If you experience severe or prolonged shortness of breath  go to the nearest emergency department.   AMBULATORY SURGERY  DISCHARGE INSTRUCTIONS   1) The drugs that you were given will stay in your system until tomorrow so for the next 24 hours you should not:  A) Drive an automobile B) Make any legal decisions C) Drink any alcoholic beverage   2) You may resume regular meals tomorrow.  Today it is better to start with liquids and gradually work up to solid foods.  You may eat anything you prefer, but it is better to start with liquids, then soup and crackers, and gradually work up to solid  foods.   3) Please notify your doctor immediately if you have any unusual bleeding, trouble breathing, redness and pain at the surgery site, drainage, fever, or pain not relieved by medication.    4) Additional Instructions:        Please contact your physician with any problems or Same Day Surgery at [phone], Monday through Friday 6 am to 4 pm, or [place] at [facility] number at [phone].

## 2020-10-19 NOTE — Anesthesia Preprocedure Evaluation (Signed)
Anesthesia Evaluation  Patient identified by MRN, date of birth, ID band Patient awake    Reviewed: Allergy & Precautions, NPO status , Patient's Chart, lab work & pertinent test results  History of Anesthesia Complications Negative for: history of anesthetic complications  Airway Mallampati: II  TM Distance: >3 FB Neck ROM: Limited    Dental no notable dental hx. (+) Teeth Intact   Pulmonary neg pulmonary ROS, sleep apnea , neg COPD, Patient abstained from smoking.Not current smoker,    Pulmonary exam normal breath sounds clear to auscultation       Cardiovascular Exercise Tolerance: Good METShypertension, (-) CAD and (-) Past MI negative cardio ROS  (-) dysrhythmias  Rhythm:Regular Rate:Normal - Systolic murmurs    Neuro/Psych PSYCHIATRIC DISORDERS Anxiety Depression  Neuromuscular disease negative neurological ROS  negative psych ROS   GI/Hepatic GERD  Controlled,(+)     (-) substance abuse  ,   Endo/Other  neg diabetes  Renal/GU negative Renal ROS     Musculoskeletal  (+) Arthritis , Neck arthritis, gets steroid injections there    Abdominal   Peds  Hematology   Anesthesia Other Findings Past Medical History: No date: Anxiety No date: Arthritis No date: Chronic low back pain with left-sided sciatica No date: Chronic pain syndrome No date: Depression No date: Fibroid No date: GERD (gastroesophageal reflux disease) No date: History of kidney stones No date: Hypertension No date: IBS (irritable bowel syndrome)     Comment:  with constipation No date: Insomnia 09/2020: Kidney lesion, native, left     Comment:  following renal mass protocol on 10/14/20 No date: Kidney stone No date: Lumbar radiculopathy No date: Radiculitis, cervical     Comment:  c7-c8 No date: Sleep apnea 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 on daily antibix treatment   Reproductive/Obstetrics                             Anesthesia Physical Anesthesia Plan  ASA: II  Anesthesia Plan: General   Post-op Pain Management:  Regional for Post-op pain   Induction: Intravenous  PONV Risk Score and Plan: 3 and Ondansetron, Dexamethasone and Midazolam  Airway Management Planned: Oral ETT  Additional Equipment: None  Intra-op Plan:   Post-operative Plan: Extubation in OR  Informed Consent: I have reviewed the patients History and Physical, chart, labs and discussed the procedure including the risks, benefits and alternatives for the proposed anesthesia with the patient or authorized representative who has indicated his/her understanding and acceptance.     Dental advisory given  Plan Discussed with: CRNA and Surgeon  Anesthesia Plan Comments: (Discussed risks of anesthesia with patient, including PONV, sore throat, lip/dental damage. Rare risks discussed as well, such as cardiorespiratory and neurological sequelae. Patient understands. Discussed r/b/a of interscalene block, including elective nature. Risks discussed: - Rare: bleeding, infection, nerve damage - shortness of breath from hemidiaphragmatic paralysis - unilateral horner's syndrome - poor/non-working blocks Patient understands and agrees. )        Anesthesia Quick Evaluation

## 2020-10-19 NOTE — H&P (Signed)
History of Present Illness:  Natasha Estrada is a 49 y.o. female that presents to clinic today for her preoperative history and evaluation. Patient presents unaccompanied. The patient is scheduled to undergo a left shoulder arthroscopy with debridement, decompression, and possible biceps tenodesis on 10/19/20 by Dr. Roland Rack. The patient reports a long history of left shoulder pain. She is status post a previous left shoulder rotator cuff repair with residual left shoulder tendinitis. Pain is felt primarily with activities at or above shoulder level as well as with certain lifting or carrying activities.   The patient's symptoms have progressed to the point that they decrease her quality of life. The patient has previously undergone conservative treatment including NSAIDS, activity modification, and an injection into the subacromial space without adequate control of her symptoms.  Patient has received all necessary clearances for surgery.   Past Medical History:  . Abnormal Pap smear 2007, 2019  . Adenomyosis 2015  . Allergy  . Anemia  . Anxiety  . Arthritis  . COVID-19  . Depression  . Dysmenorrhea  . Fibroid  . GERD (gastroesophageal reflux disease)  . Hypertension  . Insomnia  . Kidney stone  . Menorrhagia with regular cycle  . Pneumonia  . Recurrent nephrolithiasis   Past Surgical History:  . [place] SURGERY 2004  . CERVICAL CONE BIOPSY  . CHOLECYSTECTOMY 2010  . COLONOSCOPY 2005  . GYNECOLOGIC CRYOSURGERY 2007  . INTRAOPERATIVE FLUOROSCOPY Right 10/15/2017  Procedure: FLUOROSCOPY; Surgeon: Edwyna Ready, MD; Location: [facility]; Service: Urology; Laterality: Right;  . KIDNEY STONE SURGERY 2008  . MYOMECTOMY VAGINAL APPROACH 2011  . PR CYSTO/URETERO W/LITHOTRIPSY &INDWELL STENT INSRT Right 10/15/2017  Procedure: Cystoscopy/Right Ureteroscopy with Holmium Laser-Basket Stone Extraction/Right Retrograde Pyelogram/Right JJ Stent; Surgeon: Edwyna Ready, MD;  Location: [facility]; Service: Urology; Laterality: Right;  . ROUX-EN-Y PROCEDURE 2004  Dr. Fatima Sanger at [facility]  . SHOULDER ARTHROSCOPY W/ ROTATOR CUFF REPAIR 2010  . TONSILLECTOMY 1980  and adenoids   Current Medications:  . buPROPion (WELLBUTRIN XL) 150 MG XL tablet Take 1 tablet (150 mg total) by mouth every morning 90 tablet 3  . busPIRone (BUSPAR) 7.5 MG tablet Take 1 tablet (7.5 mg total) by mouth 2 (two) times daily 180 tablet 3  . cyclobenzaprine (FLEXERIL) 10 MG tablet Take 1 tablet by mouth nightly  . DULoxetine (CYMBALTA) 60 MG DR capsule Take 1 capsule (60 mg total) by mouth once daily (Patient taking differently: Take 60 mg by mouth 2 (two) times daily ) 90 capsule 1  . gabapentin (NEURONTIN) 100 MG capsule  . levonorgestrel (MIRENA 52 MG) 20 mcg/24 hr (5 years) IUD Insert 1 each into the uterus once. Follow package directions.  Marland Kitchen LINZESS 145 mcg capsule TAKE 1 CAPSULE(145 MCG) BY MOUTH EVERY DAY 90 capsule 0  . losartan (COZAAR) 25 MG tablet TAKE 1 TABLET BY MOUTH EVERY DAY 30 tablet 1  . omeprazole (PRILOSEC) 20 MG DR capsule TAKE 1 CAPSULE(20 MG) BY MOUTH EVERY DAY 90 capsule 1  . propranoloL (INDERAL) 40 MG tablet TAKE 1 TABLET BY MOUTH TWICE DAILY 60 tablet 1  . tamsulosin (FLOMAX) 0.4 mg capsule  . trimethoprim 100 mg tablet  . zolpidem (AMBIEN) 5 MG tablet Take 5 mg by mouth once daily   Current Facility-Administered Medications: . cyanocobalamin (VITAMIN B12) injection 1,000 mcg 1,000 mcg Intramuscular Q30 Days L[place]on Colonel, [place] 1,000 mcg at 06/25/19 1113   Allergies:  . Cefuroxime Shortness Of Breath  . Sulfa (Sulfonamide Antibiotics)  Rash   Social History:   Socioeconomic History:  Marland Kitchen Marital status: Married  Spouse name: Not on file  . Number of children: Not on file  . Years of education: Not on file  . Highest education level: Not on file  Occupational History  . Occupation: Therapist, [employer]  Tobacco Use  . Smoking status: Never Smoker  . Smokeless  tobacco: Never Used  Vaping Use  . Vaping Use: Never used  Substance and Sexual Activity  . Alcohol use: Yes  Alcohol/week: 0.0 standard drinks  Comment: rarely  . Drug use: No  . Sexual activity: Yes  Partners: Male  Birth control/protection: I.U.D.  Other Topics Concern  . Not on file  Social History Narrative  . Not on file   Social Determinants of Health:   Financial Resource Strain: Not on file  Food Insecurity: Not on file  Transportation Needs: Not on file  Physical Activity: Not on file  Stress: Not on file  Social Connections: Not on file  Housing Stability: Not on file   Family History:  . High blood pressure (Hypertension) Mother  . Thyroid disease Mother  . Hyperlipidemia (Elevated cholesterol) Mother 65  . Osteoporosis (Thinning of bones) Mother 37  . Osteoarthritis Mother  . Alzheimer's disease Father  . Anxiety Father  . Colon polyps Father  . Stroke Father 45  . Hyperlipidemia (Elevated cholesterol) Father 58  . Diabetes Father  . Heart disease Father  . Parkinsonism Father  . High blood pressure (Hypertension) Father  . Dementia Father  . Alzheimer's disease Maternal Uncle  . Glaucoma Maternal Grandmother  . Early menopause Maternal Grandmother 69  . Colon cancer Maternal Grandfather  . Cancer Maternal Grandfather  . Breast cancer Paternal Grandmother  . Diabetes Paternal Grandmother  . Myocardial Infarction (Heart attack) Son  . Sudden cardiac death Son 28  . Gallbladder disease Son   Review of Systems:  A 10+ ROS was performed, reviewed, and the pertinent orthopaedic findings are documented in the HPI.   Physical Examination:  BP 132/84  Ht 162.6 cm (5\' 4" )  Wt 83.5 kg (184 lb)  BMI 31.58 kg/m   Patient is a well-developed, well-nourished female in no acute distress. Patient has normal mood and affect. Patient is alert and oriented to person, place, and time.   HEENT: Atraumatic, normocephalic. Pupils equal and reactive to light.  Extraocular motion intact. Noninjected sclera.  Cardiovascular: Regular rate and rhythm, with no murmurs, rubs, or gallops. Radial pulse 2+  Respiratory: Lungs clear to auscultation bilaterally.   Leftshoulder exam: SKIN:Normal SWELLING:None WARMTH:None LYMPH NODES:No adenopathy palpable CREPITUS:None TENDERNESS:Mild tendernessover anterolateral shoulder and scapular spine ROM (active):  Forward flexion:140degrees Abduction: 110degrees Internal rotation: L4-L5 ROM (passive):  Forward flexion: 160degrees Abduction:120 ER/IR at 90 abd: 90 degrees/60degrees  She experiencesmild-moderate pain with forward flexion and abduction, and mild pain withall other motions.  STRENGTH: Forward flexion: 4+/5 Abduction: 4-4+/5 External rotation: 4+/5 Internal rotation: 4+/5 Pain with RC testing: Significant pain with resisted abduction more so than with resisted forward flexion  STABILITY:Normal  SPECIAL TESTS: Luan Pulling' test:Mildlypositive Speed's test: Equivocally positive Capsulitis - pain w/ passive ER:No Crossed arm test: Minimally positive Crank: Not evaluated Anterior apprehension: Negative Posterior apprehension: Not evaluated  She is neurovascularly intact to the leftupper extremity. Patient able to make an OK sign, thumbs up, and criss-cross the 2nd and 3rd digits.   X-rays: No new radiographs were obtained today. Previous radiographs obtained 05/31/2020 revealed postsurgical changes shown by a retained metal corkscrew  in the humeral head. No evidence of loosening is appreciated. No osseous abnormalities appreciated.  Subacromial space is well-preserved.  Impression:  1. Rotator cuff tendinitis, left M75.82  2. Status post left rotator cuff repair Z98.890  3. Nontraumatic incomplete tear of left rotator cuff M75.112    Plan:  The patient will benefit from a left shoulder arthroscopy with debridement, decompression, and possible biceps tenodesis. Having failed conservative treatment, the patient has elected to proceed with surgery. The risks of surgery, including infection and blood clots, were discussed with the patient. Measures taken to reduce these risks were also discussed with the patient. The postoperative course was discussed with the patient. Patient was instructed to stop all blood thinners prior to surgery. Patient understands and elects to proceed with surgery. The patient will undergo a left shoulder arthroscopy with debridement, decompression, and possible biceps tenodesis by Dr. Roland Rack on 10/19/20.   H&P reviewed and patient re-examined. No changes.

## 2020-10-19 NOTE — Anesthesia Procedure Notes (Signed)
Procedure Name: Intubation Date/Time: 10/19/2020 2:11 PM Performed by: Willette Alma, CRNA Pre-anesthesia Checklist: Patient identified, Patient being monitored, Timeout performed, Emergency Drugs available and Suction available Patient Re-evaluated:Patient Re-evaluated prior to induction Oxygen Delivery Method: Circle system utilized Preoxygenation: Pre-oxygenation with 100% oxygen Induction Type: IV induction Ventilation: Mask ventilation without difficulty Laryngoscope Size: 3 and McGraph Grade View: Grade I Tube type: Oral Tube size: 6.5 mm Number of attempts: 1 Airway Equipment and Method: Stylet Placement Confirmation: ETT inserted through vocal cords under direct vision,  positive ETCO2 and breath sounds checked- equal and bilateral Secured at: 21 cm Tube secured with: Tape Dental Injury: Teeth and Oropharynx as per pre-operative assessment

## 2020-10-19 NOTE — Anesthesia Procedure Notes (Signed)
Anesthesia Regional Block: Interscalene brachial plexus block   Pre-Anesthetic Checklist: ,, timeout performed, Correct Patient, Correct Site, Correct Laterality, Correct Procedure, Correct Position, site marked, Risks and benefits discussed,  Surgical consent,  Pre-op evaluation,  At surgeon's request and post-op pain management  Laterality: Left  Prep: chloraprep       Needles:  Injection technique: Single-shot  Needle Type: Echogenic Needle     Needle Length: 4cm  Needle Gauge: 25     Additional Needles:   Narrative:  Start time: 10/19/2020 12:55 PM End time: 10/19/2020 12:59 PM Injection made incrementally with aspirations every 5 mL.  Performed by: Personally  Anesthesiologist: Arita Miss, MD  Additional Notes: Patient consented for risk and benefits of nerve block including but not limited to: nerve damage, failed block, bleeding and infection, hemidiaphragmatic paralysis and shortness of breath, Horner's syndrome.  Patient voiced understanding.  Functioning IV was confirmed and monitors were applied.  Sterile prep,hand hygiene and sterile gloves were used.  Minimal sedation used for procedure.   No paresthesia endorsed by patient during the procedure.  Negative aspiration and negative test dose prior to incremental administration of local anesthetic. The patient tolerated the procedure well with no immediate complications.

## 2020-10-19 NOTE — Op Note (Signed)
10/19/2020  4:16 PM  Patient:   Natasha Estrada  Pre-Op Diagnosis:   Impingement/tendinopathy with possible biceps tendinopathy status post prior rotator cuff repair, left shoulder.  Post-Op Diagnosis:   Impingement/tendinopathy with recurrent near full-thickness rotator cuff tear and biceps tendinopathy, left shoulder.  Procedure:   Limited arthroscopic debridement, arthroscopic subacromial decompression, mini-open rotator cuff repair using a [employer]hew Regeneten patch, and mini-open biceps tenodesis, left shoulder.  Anesthesia:   General endotracheal with interscalene block using Exparel placed preoperatively by the anesthesiologist.  Surgeon:   Pascal Lux, MD  Assistant:   Cameron Proud, PA-C; Sherlene Shams, PA-S  Findings:   As above. There was mild fraying of the labrum superiorly without frank detachment from the glenoid rim. There was a near full-thickness recurrent bursal-sided rotator cuff tear involving the posterior portion of the supraspinatus tendon/anterior portion of the infraspinatus tendon. The remainder the rotator cuff was in excellent condition. There was mild "lip sticking" of the biceps tendon without actual partial or full-thickness tearing. The articular surfaces of the glenoid and humerus both were in satisfactory condition.  Complications:   None  Fluids:   600 cc  Estimated blood loss:   5 cc  Tourniquet time:   None  Drains:   None  Closure:   Staples      Brief clinical note:   The patient is a 49 year old female with a several year history of progressively worsening left shoulder pain. The patient's symptoms have progressed despite medications, activity modification, etc. The patient's history and examination are consistent with impingement/tendinopathy with possible biceps tendinopathy status post a prior rotator cuff repair. The patient's preoperative MRI scan was inconclusive for any recurrent tears or significant intra-articular pathology. The  patient presents at this time for definitive management of these shoulder symptoms.  Procedure:   The patient underwent placement of an interscalene block using Exparel by the anesthesiologist in the preoperative holding area before being brought into the operating room and lain in the supine position. The patient then underwent general endotracheal intubation and anesthesia before being repositioned in the beach chair position using the beach chair positioner. The left shoulder and upper extremity were prepped with ChloraPrep solution before being draped sterilely. Preoperative antibiotics were administered. A timeout was performed to confirm the proper surgical site before the expected portal sites and incision site were injected with 0.5% Sensorcaine with epinephrine.   A posterior portal was created and the glenohumeral joint thoroughly inspected with the findings as described above. An anterior portal was created using an outside-in technique. The labrum and rotator cuff were further probed, again confirming the above-noted findings. The areas of labral fraying and fraying on the articular surface of the supraspinatus and infraspinatus tendons were lightly debrided using the full-radius resector. The ArthroCare wand was inserted and used to release the biceps tendon from its labral anchor. It also was used to obtain hemostasis as well as to "anneal" the labrum superiorly. The instruments were removed from the joint after suctioning the excess fluid.  The camera was repositioned through the posterior portal into the subacromial space. A separate lateral portal was created using an outside-in technique. The 3.5 mm full-radius resector was introduced and used to perform a subtotal bursectomy. The ArthroCare wand was then inserted and used to remove the periosteal tissue off the undersurface of the anterior third of the acromion as well as to recess the coracoacromial ligament from its attachment along the  anterior and lateral margins of the acromion. The  instruments were then removed from the subacromial space after suctioning the excess fluid.  An approximately 4-5 cm incision was made over the anterolateral aspect of the shoulder beginning at the anterolateral corner of the acromion and extending distally in line with the bicipital groove. This incision was carried down through the subcutaneous tissues to expose the deltoid fascia. The raphae between the anterior and middle thirds was identified and this plane developed to provide access into the subacromial space. Additional bursal tissues were debrided sharply using Metzenbaum scissors. The rotator cuff tear was carefully inspected.  The suture knot from the previously placed anchor was still present and appeared to be somewhat loose, so this was removed.  Upon removing it, a recurrent near full-thickness bursal sided focal rotator cuff tear was readily identified. The margins were debrided sharply with a #15 blade and the exposed greater tuberosity roughened with a rongeur. The tear was repaired using one Biomet 2.9 mm JuggerKnot anchor. Both sutures were secured in a side-to-side fashion to effect the repair. An apparent watertight closure was obtained.  Given the fact that this was a recurrent tear and the patient is only 49 years of age, it was felt best to try to optimize the healing of this recurrent tear.  Therefore, a medium-sized [place] patch was applied over the repair site and secured using appropriate bone and soft tissue staples.  The bicipital groove was identified by palpation and opened for 1-1.5 cm. The biceps tendon stump was retrieved through this defect. The floor of the bicipital groove was roughened with a curet before another Biomet 2.9 mm JuggerKnot anchor was inserted. Both sets of sutures were passed through the biceps tendon and tied securely to effect the tenodesis. The bicipital sheath was reapproximated using  two #0 Ethibond interrupted sutures, incorporating the biceps tendon to further reinforce the tenodesis.  The wound was copiously irrigated with sterile saline solution before the deltoid raphae was reapproximated using 2-0 Vicryl interrupted sutures. The subcutaneous tissues were closed in two layers using 2-0 Vicryl interrupted sutures before the skin was closed using staples. The portal sites also were closed using staples. A sterile bulky dressing was applied to the shoulder before the arm was placed into a shoulder immobilizer. The patient was then awakened, extubated, and returned to the recovery room in satisfactory condition after tolerating the procedure well.

## 2020-10-19 NOTE — Transfer of Care (Signed)
Immediate Anesthesia Transfer of Care Note  Patient: Natasha Estrada  Procedure(s) Performed: LEFT SHOULDER ARTHROSCOPY WITH DEBRIDEMENT DECOMPRESSION AND POSSIBLE BICEP TENODESIS (Left Shoulder)  Patient Location: PACU  Anesthesia Type:General  Level of Consciousness: awake, alert  and oriented  Airway & Oxygen Therapy: Patient Spontanous Breathing and Patient connected to face mask oxygen  Post-op Assessment: Report given to RN and Post -op Vital signs reviewed and stable  Post vital signs: Reviewed and stable  Last Vitals:  Vitals Value Taken Time  BP 124/84 10/19/20 1551  Temp    Pulse 86 10/19/20 1554  Resp 15 10/19/20 1554  SpO2 100 % 10/19/20 1554  Vitals shown include unvalidated device data.  Last Pain:  Vitals:   10/19/20 1143  TempSrc: Tympanic  PainSc: 7          Complications: No complications documented.

## 2020-10-20 ENCOUNTER — Encounter: Payer: Self-pay | Admitting: Surgery

## 2020-10-20 NOTE — Anesthesia Postprocedure Evaluation (Signed)
Anesthesia Post Note  Patient: Natasha Estrada  Procedure(s) Performed: LEFT SHOULDER ARTHROSCOPY WITH DEBRIDEMENT DECOMPRESSION AND POSSIBLE BICEP TENODESIS (Left Shoulder)  Patient location during evaluation: PACU Anesthesia Type: General Level of consciousness: awake and alert Pain management: pain level controlled Vital Signs Assessment: post-procedure vital signs reviewed and stable Respiratory status: spontaneous breathing, nonlabored ventilation and respiratory function stable Cardiovascular status: blood pressure returned to baseline and stable Postop Assessment: no apparent nausea or vomiting Anesthetic complications: no   No complications documented.   Last Vitals:  Vitals:   10/19/20 1632 10/19/20 1643  BP:  132/85  Pulse: 85 81  Resp: 18 16  Temp: 36.7 C 37.3 C  SpO2: 98% 98%    Last Pain:  Vitals:   10/19/20 1643  TempSrc: Temporal  PainSc: [place]

## 2020-12-01 ENCOUNTER — Other Ambulatory Visit: Payer: Self-pay | Admitting: Orthopedic Surgery

## 2020-12-01 DIAGNOSIS — S32020A Wedge compression fracture of second lumbar vertebra, initial encounter for closed fracture: Secondary | ICD-10-CM

## 2020-12-02 ENCOUNTER — Other Ambulatory Visit: Payer: Self-pay | Admitting: Orthopedic Surgery

## 2020-12-02 ENCOUNTER — Ambulatory Visit
Admission: RE | Admit: 2020-12-02 | Discharge: 2020-12-02 | Disposition: A | Discharge status: Home / Self Care | Payer: BC Managed Care – PPO | Source: Ambulatory Visit | Attending: Orthopedic Surgery | Admitting: Orthopedic Surgery

## 2020-12-02 ENCOUNTER — Other Ambulatory Visit: Payer: Self-pay

## 2020-12-02 DIAGNOSIS — S32020A Wedge compression fracture of second lumbar vertebra, initial encounter for closed fracture: Secondary | ICD-10-CM | POA: Diagnosis present

## 2020-12-06 ENCOUNTER — Other Ambulatory Visit
Admission: RE | Admit: 2020-12-06 | Discharge: 2020-12-06 | Disposition: A | Discharge status: Home / Self Care | Payer: BC Managed Care – PPO | Source: Ambulatory Visit | Attending: Orthopedic Surgery | Admitting: Orthopedic Surgery

## 2020-12-06 DIAGNOSIS — Z01812 Encounter for preprocedural laboratory examination: Secondary | ICD-10-CM | POA: Insufficient documentation

## 2020-12-06 DIAGNOSIS — Z882 Allergy status to sulfonamides status: Secondary | ICD-10-CM | POA: Diagnosis not present

## 2020-12-06 DIAGNOSIS — Z881 Allergy status to other antibiotic agents status: Secondary | ICD-10-CM | POA: Diagnosis not present

## 2020-12-06 DIAGNOSIS — Z20822 Contact with and (suspected) exposure to covid-19: Secondary | ICD-10-CM | POA: Insufficient documentation

## 2020-12-06 DIAGNOSIS — Z793 Long term (current) use of hormonal contraceptives: Secondary | ICD-10-CM | POA: Diagnosis not present

## 2020-12-06 DIAGNOSIS — S32020A Wedge compression fracture of second lumbar vertebra, initial encounter for closed fracture: Secondary | ICD-10-CM | POA: Diagnosis present

## 2020-12-06 DIAGNOSIS — W19XXXA Unspecified fall, initial encounter: Secondary | ICD-10-CM | POA: Diagnosis not present

## 2020-12-06 DIAGNOSIS — Z79899 Other long term (current) drug therapy: Secondary | ICD-10-CM | POA: Diagnosis not present

## 2020-12-07 ENCOUNTER — Encounter
Admission: RE | Disposition: A | Discharge status: Home / Self Care | Payer: Self-pay | Source: Home / Self Care | Attending: Orthopedic Surgery

## 2020-12-07 ENCOUNTER — Ambulatory Visit: Payer: BC Managed Care – PPO

## 2020-12-07 ENCOUNTER — Ambulatory Visit: Payer: BC Managed Care – PPO | Admitting: Registered Nurse

## 2020-12-07 ENCOUNTER — Ambulatory Visit
Admission: RE | Admit: 2020-12-07 | Discharge: 2020-12-07 | Disposition: A | Discharge status: Home / Self Care | Payer: BC Managed Care – PPO | Attending: Orthopedic Surgery | Admitting: Orthopedic Surgery

## 2020-12-07 ENCOUNTER — Encounter: Payer: Self-pay | Admitting: Orthopedic Surgery

## 2020-12-07 ENCOUNTER — Other Ambulatory Visit: Payer: Self-pay

## 2020-12-07 DIAGNOSIS — S32020A Wedge compression fracture of second lumbar vertebra, initial encounter for closed fracture: Secondary | ICD-10-CM | POA: Diagnosis not present

## 2020-12-07 DIAGNOSIS — W19XXXA Unspecified fall, initial encounter: Secondary | ICD-10-CM | POA: Insufficient documentation

## 2020-12-07 DIAGNOSIS — Z419 Encounter for procedure for purposes other than remedying health state, unspecified: Secondary | ICD-10-CM

## 2020-12-07 DIAGNOSIS — Z79899 Other long term (current) drug therapy: Secondary | ICD-10-CM | POA: Insufficient documentation

## 2020-12-07 DIAGNOSIS — Z882 Allergy status to sulfonamides status: Secondary | ICD-10-CM | POA: Insufficient documentation

## 2020-12-07 DIAGNOSIS — Z793 Long term (current) use of hormonal contraceptives: Secondary | ICD-10-CM | POA: Insufficient documentation

## 2020-12-07 DIAGNOSIS — Z881 Allergy status to other antibiotic agents status: Secondary | ICD-10-CM | POA: Insufficient documentation

## 2020-12-07 DIAGNOSIS — Z20822 Contact with and (suspected) exposure to covid-19: Secondary | ICD-10-CM | POA: Insufficient documentation

## 2020-12-07 HISTORY — PX: KYPHOPLASTY: SHX5884

## 2020-12-07 LAB — POCT PREGNANCY, URINE: Preg Test, Ur: NEGATIVE

## 2020-12-07 LAB — SARS CORONAVIRUS 2 (TAT 6-24 HRS): SARS Coronavirus 2: NEGATIVE

## 2020-12-07 SURGERY — KYPHOPLASTY
Anesthesia: Monitor Anesthesia Care

## 2020-12-07 MED ORDER — ORAL CARE MOUTH RINSE: 15.0000 mL | Freq: Once | OROMUCOSAL | Status: AC

## 2020-12-07 MED ORDER — METOCLOPRAMIDE HCL 10 MG PO TABS: 5.0000 mg | ORAL_TABLET | Freq: Three times a day (TID) | ORAL | Status: DC | PRN

## 2020-12-07 MED ORDER — DEXMEDETOMIDINE HCL 200 MCG/2ML IV SOLN
INTRAVENOUS | Status: DC | PRN
  Administered 2020-12-07: 8 ug via INTRAVENOUS
  Administered 2020-12-07: 12 ug via INTRAVENOUS

## 2020-12-07 MED ORDER — CHLORHEXIDINE GLUCONATE 0.12 % MT SOLN: 15.0000 mL | Freq: Once | OROMUCOSAL | Status: AC

## 2020-12-07 MED ORDER — LIDOCAINE HCL (PF) 2 % IJ SOLN
INTRAMUSCULAR | Status: AC
  Filled 2020-12-07: qty 5

## 2020-12-07 MED ORDER — FENTANYL CITRATE (PF) 100 MCG/2ML IJ SOLN
INTRAMUSCULAR | Status: AC
  Administered 2020-12-07: 25 ug via INTRAVENOUS
  Filled 2020-12-07: qty 2

## 2020-12-07 MED ORDER — PHENYLEPHRINE HCL (PRESSORS) 10 MG/ML IV SOLN
INTRAVENOUS | Status: DC | PRN
  Administered 2020-12-07 (×2): 100 ug via INTRAVENOUS

## 2020-12-07 MED ORDER — PROPOFOL 10 MG/ML IV BOLUS
INTRAVENOUS | Status: DC | PRN
  Administered 2020-12-07: 20 mg via INTRAVENOUS
  Administered 2020-12-07: 30 mg via INTRAVENOUS

## 2020-12-07 MED ORDER — ONDANSETRON HCL 4 MG/2ML IJ SOLN: 4.0000 mg | Freq: Four times a day (QID) | INTRAMUSCULAR | Status: DC | PRN

## 2020-12-07 MED ORDER — MIDAZOLAM HCL 2 MG/2ML IJ SOLN
INTRAMUSCULAR | Status: AC
  Filled 2020-12-07: qty 2

## 2020-12-07 MED ORDER — CHLORHEXIDINE GLUCONATE 0.12 % MT SOLN
OROMUCOSAL | Status: AC
  Administered 2020-12-07: 15 mL via OROMUCOSAL
  Filled 2020-12-07: qty 15

## 2020-12-07 MED ORDER — LIDOCAINE HCL (CARDIAC) PF 100 MG/5ML IV SOSY
PREFILLED_SYRINGE | INTRAVENOUS | Status: DC | PRN
  Administered 2020-12-07: 50 mg via INTRAVENOUS

## 2020-12-07 MED ORDER — SODIUM CHLORIDE 0.9 % IV SOLN: INTRAVENOUS | Status: DC

## 2020-12-07 MED ORDER — CLINDAMYCIN PHOSPHATE 900 MG/50ML IV SOLN
900.0000 mg | INTRAVENOUS | Status: AC
  Administered 2020-12-07: 900 mg via INTRAVENOUS

## 2020-12-07 MED ORDER — MIDAZOLAM HCL 2 MG/2ML IJ SOLN
INTRAMUSCULAR | Status: DC | PRN
  Administered 2020-12-07: 2 mg via INTRAVENOUS

## 2020-12-07 MED ORDER — DEXMEDETOMIDINE (PRECEDEX) IN NS 20 MCG/5ML (4 MCG/ML) IV SYRINGE
PREFILLED_SYRINGE | INTRAVENOUS | Status: AC
  Filled 2020-12-07: qty 5

## 2020-12-07 MED ORDER — BUPIVACAINE-EPINEPHRINE (PF) 0.5% -1:200000 IJ SOLN
INTRAMUSCULAR | Status: DC | PRN
  Administered 2020-12-07: 20 mL

## 2020-12-07 MED ORDER — ONDANSETRON HCL 4 MG PO TABS: 4.0000 mg | ORAL_TABLET | Freq: Four times a day (QID) | ORAL | Status: DC | PRN

## 2020-12-07 MED ORDER — METOCLOPRAMIDE HCL 5 MG/ML IJ SOLN: 5.0000 mg | Freq: Three times a day (TID) | INTRAMUSCULAR | Status: DC | PRN

## 2020-12-07 MED ORDER — OXYCODONE HCL 5 MG PO TABS: 5.0000 mg | ORAL_TABLET | ORAL | 0 refills | Status: DC | PRN

## 2020-12-07 MED ORDER — FENTANYL CITRATE (PF) 100 MCG/2ML IJ SOLN
25.0000 ug | INTRAMUSCULAR | Status: DC | PRN
  Administered 2020-12-07 (×3): 25 ug via INTRAVENOUS

## 2020-12-07 MED ORDER — KETAMINE HCL 10 MG/ML IJ SOLN
INTRAMUSCULAR | Status: DC | PRN
  Administered 2020-12-07: 30 mg via INTRAVENOUS
  Administered 2020-12-07: 20 mg via INTRAVENOUS

## 2020-12-07 MED ORDER — ONDANSETRON HCL 4 MG/2ML IJ SOLN: 4.0000 mg | Freq: Once | INTRAMUSCULAR | Status: DC | PRN

## 2020-12-07 MED ORDER — LIDOCAINE HCL (PF) 1 % IJ SOLN
INTRAMUSCULAR | Status: AC
  Filled 2020-12-07: qty 60

## 2020-12-07 MED ORDER — LIDOCAINE HCL 1 % IJ SOLN
INTRAMUSCULAR | Status: DC | PRN
  Administered 2020-12-07: 30 mL

## 2020-12-07 MED ORDER — PROPOFOL 500 MG/50ML IV EMUL
INTRAVENOUS | Status: DC | PRN
  Administered 2020-12-07: 75 ug/kg/min via INTRAVENOUS

## 2020-12-07 MED ORDER — CLINDAMYCIN PHOSPHATE 900 MG/50ML IV SOLN
INTRAVENOUS | Status: AC
  Filled 2020-12-07: qty 50

## 2020-12-07 MED ORDER — KETAMINE HCL 50 MG/5ML IJ SOSY
PREFILLED_SYRINGE | INTRAMUSCULAR | Status: AC
  Filled 2020-12-07: qty 5

## 2020-12-07 MED ORDER — LACTATED RINGERS IV SOLN: INTRAVENOUS | Status: DC

## 2020-12-07 SURGICAL SUPPLY — 20 items
CEMENT KYPHON CX01A KIT/MIXER (Cement) ×2 IMPLANT
COVER WAND RF STERILE (DRAPES) ×2 IMPLANT
DERMABOND ADVANCED (GAUZE/BANDAGES/DRESSINGS) ×1
DERMABOND ADVANCED .7 DNX12 (GAUZE/BANDAGES/DRESSINGS) ×1 IMPLANT
DEVICE BIOPSY BONE KYPHX (INSTRUMENTS) ×2 IMPLANT
DRAPE C-ARM XRAY 36X54 (DRAPES) ×2 IMPLANT
DURAPREP 26ML APPLICATOR (WOUND CARE) ×2 IMPLANT
GLOVE SURG SYN 9.0  PF PI (GLOVE) ×1
GLOVE SURG SYN 9.0 PF PI (GLOVE) ×1 IMPLANT
GOWN SRG 2XL LVL 4 RGLN SLV (GOWNS) ×1 IMPLANT
GOWN STRL NON-REIN 2XL LVL4 (GOWNS) ×1
GOWN STRL REUS W/ TWL LRG LVL3 (GOWN DISPOSABLE) ×1 IMPLANT
GOWN STRL REUS W/TWL LRG LVL3 (GOWN DISPOSABLE) ×1
MANIFOLD NEPTUNE II (INSTRUMENTS) ×2 IMPLANT
PACK KYPHOPLASTY (MISCELLANEOUS) ×2 IMPLANT
RENTAL RFA GENERATOR (MISCELLANEOUS) IMPLANT
STRAP SAFETY 5IN WIDE (MISCELLANEOUS) ×2 IMPLANT
SWABSTK COMLB BENZOIN TINCTURE (MISCELLANEOUS) ×2 IMPLANT
TRAY KYPHOPAK 15/3 EXPRESS 1ST (MISCELLANEOUS) ×2 IMPLANT
TRAY KYPHOPAK 20/3 EXPRESS 1ST (MISCELLANEOUS) ×2 IMPLANT

## 2020-12-07 NOTE — Op Note (Signed)
12/07/2020  1:57 PM  PATIENT:  Natasha Estrada   MRN: [id number]   PRE-OPERATIVE DIAGNOSIS:  closed wedge compression fracture of L2   POST-OPERATIVE DIAGNOSIS:  closed wedge compression fracture of L2   PROCEDURE:  Procedure(s): KYPHOPLASTY L2  SURGEON: Laurene Footman, MD   ASSISTANTS: None   ANESTHESIA:   local and MAC   EBL:  No intake/output data recorded.   BLOOD ADMINISTERED:none   DRAINS: none    LOCAL MEDICATIONS USED:  MARCAINE    and XYLOCAINE    SPECIMEN:   L2 vertebral body biopsy   DISPOSITION OF SPECIMEN:  Pathology   COUNTS:  YES   TOURNIQUET:  * No tourniquets in log *   IMPLANTS: Bone cement   DICTATION: .Dragon Dictation  patient was brought to the operating room and after adequate anesthesia was obtained the patient was placed prone.  C arm was brought in in good visualization of the affected level obtained on both AP and lateral projections.  After patient identification and timeout procedures were completed, local anesthetic was infiltrated with 10 cc 1% Xylocaine infiltrated subcutaneously.  This is done the area on the each side of the planned approach.  The back was then prepped and draped in the usual sterile manner and repeat timeout procedure carried out.  A spinal needle was brought down to the pedicle on the each side of  L2 and a 50-50 mix of 1% Xylocaine half percent Sensorcaine with epinephrine total of 20 cc injected on each side.  After allowing this to set a small incision was made and the trocar was advanced into the vertebral body in an extrapedicular fashion.  Biopsy was obtained Drilling was carried out balloon inserted with inflation to  1-1/2 cc on the right and 1-1/2 cc on the left.  When the cement was appropriate consistency 4.0 cc were injected on the right and 2.5 cc on the left into the vertebral body without extravasation, good fill superior to inferior endplates and from right to left sides along the inferior endplate.  After the  cement had set the trochar was removed and permanent C-arm views obtained.  The wound was closed with Dermabond followed by Band-Aid   PLAN OF CARE:  Discharge home after recovery room   PATIENT DISPOSITION:  PACU - hemodynamically stable.

## 2020-12-07 NOTE — H&P (Signed)
Chief Complaint  Patient presents with  . Establish Care  Fall 12/20 landed on Rt side, Low back pain and Rt sided rib pain    History of the Present Illness: Natasha Estrada is a 50 y.o. female here today.   The patient presents for evaluation of an L2 compression fracture. 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 The patient states she has pain in her back and right ribs. She states she had rotator cuff surgery by Leron Croak, MD on 11/23/2020, and she has been on pain medication. She states she went to the bathroom and stood up, and then fell due to the medication and feeling "spaced out." The patient experienced severe pain and was in tears, and she has had continued pain. The patient states she was told to add it to her workers' compensation claim. She had x-rays obtained at the walk-in clinic.   The patient is employed by the [employer].   I have reviewed past medical, surgical, social and family history, and allergies as documented in the EMR.  Past Medical History: Past Medical History:  Diagnosis Date  . Abnormal Pap smear 2007, 2019  . Adenomyosis 2015  . Allergy  . Anemia  . Anxiety  . Arthritis  . COVID-19  . Depression  . Dysmenorrhea  . Fibroid  . GERD (gastroesophageal reflux disease)  . Hypertension  . Insomnia  . Kidney stone  . Menorrhagia with regular cycle  . Pneumonia  . Recurrent nephrolithiasis   Past Surgical History: Past Surgical History:  Procedure Laterality Date  . BARIATRIC SURGERY 2004  . CERVICAL CONE BIOPSY  . CHOLECYSTECTOMY 2010  . COLONOSCOPY 2005  . GYNECOLOGIC CRYOSURGERY 2007  . INTRAOPERATIVE FLUOROSCOPY Right 10/15/2017  Procedure: FLUOROSCOPY; Surgeon: Jac Canavan, MD; Location: [facility] OR; Service: Urology; Laterality: Right;  . KIDNEY STONE SURGERY 2008  . Limited arthroscopic debridement, arthroscopic subacromial decompression, mini-open rotator cuff repair using a [employer] Regeneten  patch, and mini-open biceps tenodesis, left shoulder. Left 10/19/2020  Dr. Joice Lofts  . MYOMECTOMY VAGINAL APPROACH 2011  . PR CYSTO/URETERO W/LITHOTRIPSY &INDWELL STENT INSRT Right 10/15/2017  Procedure: Cystoscopy/Right Ureteroscopy with Holmium Laser-Basket Stone Extraction/Right Retrograde Pyelogram/Right JJ Stent; Surgeon: Jac Canavan, MD; Location: [facility] OR; Service: Urology; Laterality: Right;  . ROUX-EN-Y PROCEDURE 2004  Dr. Kennedy Bucker at [facility]  . SHOULDER ARTHROSCOPY W/ ROTATOR CUFF REPAIR 2010  . TONSILLECTOMY 1980  and adenoids   Past Family History: Family History  Problem Relation Age of Onset  . High blood pressure (Hypertension) Mother  . Thyroid disease Mother  . Hyperlipidemia (Elevated cholesterol) Mother 28  . Osteoporosis (Thinning of bones) Mother 6  . Osteoarthritis Mother  . Alzheimer's disease Father  . Anxiety Father  . Colon polyps Father  . Stroke Father 19  . Hyperlipidemia (Elevated cholesterol) Father 27  . Diabetes Father  . Heart disease Father  . Parkinsonism Father  . High blood pressure (Hypertension) Father  . Dementia Father  . Alzheimer's disease Maternal Uncle  . Glaucoma Maternal Grandmother  . Early menopause Maternal Grandmother 60  . Colon cancer Maternal Grandfather  . Cancer Maternal Grandfather  . Breast cancer Paternal Grandmother  . Diabetes Paternal Grandmother  . Myocardial Infarction (Heart attack) Son  . Sudden cardiac death Son 46  . Gallbladder disease Son   Medications: Current Outpatient Medications Ordered in Epic  Medication Sig Dispense Refill  . buPROPion (WELLBUTRIN XL) 150 MG XL tablet TAKE 1 TABLET(150 MG)  BY MOUTH EVERY DAY 30 tablet 0  . busPIRone (BUSPAR) 7.5 MG tablet Take 1 tablet (7.5 mg total) by mouth 2 (two) times daily 180 tablet 3  . cyclobenzaprine (FLEXERIL) 10 MG tablet Take 1 tablet by mouth nightly  . DULoxetine (CYMBALTA) 60 MG DR capsule TAKE 1 CAPSULE(60 MG) BY MOUTH EVERY DAY 90 capsule 1   . gabapentin (NEURONTIN) 100 MG capsule  . gabapentin (NEURONTIN) 300 MG capsule Take 2 capsules by mouth nightly  . ibuprofen (MOTRIN) 800 MG tablet TAKE 1 TABLET(800 MG) BY MOUTH EVERY 6 HOURS AS NEEDED FOR PAIN 30 tablet 2  . levonorgestrel (MIRENA 52 MG) 20 mcg/24 hr (5 years) IUD Insert 1 each into the uterus once. Follow package directions.  Marland Kitchen LINZESS 145 mcg capsule TAKE 1 CAPSULE(145 MCG) BY MOUTH EVERY DAY 90 capsule 0  . losartan (COZAAR) 25 MG tablet TAKE 1 TABLET BY MOUTH EVERY DAY 30 tablet 1  . omeprazole (PRILOSEC) 20 MG DR capsule TAKE 1 CAPSULE(20 MG) BY MOUTH EVERY DAY 90 capsule 1  . oxyCODONE (ROXICODONE) 5 MG immediate release tablet Take 1-2 tablets (5-10 mg total) by mouth every 4 (four) hours as needed for Pain 40 tablet 0  . propranoloL (INDERAL) 40 MG tablet TAKE 1 TABLET BY MOUTH TWICE DAILY 60 tablet 1  . tamsulosin (FLOMAX) 0.4 mg capsule  . trimethoprim 100 mg tablet  . zolpidem (AMBIEN) 10 mg tablet Take 1 tablet by mouth nightly   Current Facility-Administered Medications Ordered in Epic  Medication Dose Route Frequency Provider Last Rate Last Admin  . cyanocobalamin (VITAMIN B12) injection 1,000 mcg 1,000 mcg Intramuscular Q30 Days Glorious Peach, [place] 1,000 mcg at 06/25/19 1113   Allergies: Allergies  Allergen Reactions  . Cefuroxime Shortness Of Breath  . Sulfa (Sulfonamide Antibiotics) Rash    Body mass index is 31.76 kg/m.  Review of Systems: A comprehensive 14 point ROS was performed, reviewed, and the pertinent orthopaedic findings are documented in the HPI.  Vitals:  12/01/20 1304  BP: 126/74    General Physical Examination:   General/Constitutional: No apparent distress: well-nourished and well developed. Eyes: Pupils equal, round with synchronous movement. Lungs: Clear to auscultation HEENT: Normal Vascular: No edema, swelling or tenderness, except as noted in detailed exam. Cardiac: Heart rate and rhythm is  regular. Integumentary: No impressive skin lesions present, except as noted in detailed exam. Neuro/Psych: Normal mood and affect, oriented to person, place and time.  Musculoskeletal Examination:  On exam, no clonus. Acute tenderness at L2 with kyphotic deformity. Tenderness to percussion at the spinous process.   Radiographs:  No new imaging studies were obtained or reviewed today.  Assessment: ICD-10-CM  1. Closed wedge compression fracture of L2 vertebra, initial encounter (CMS-HCC) S32.020A   Plan:  The patient has clinical findings of L2 compression fracture.  We discussed the patient's prior x-ray findings. I explained she has a compression fracture at L2 with approximately 30% compression. She also has a nondisplaced 7th posterior rib on the right. I recommend an MRI of her lumbar spine. If the MRI confirms the diagnosis, we will consider kyphoplasty. A brochure will be given.  The patient will follow up as needed.  Attestation: I, Dawn Royse, am documenting for [employer], MD utilizing Nuance DAX.     Electronically signed by Marlena Clipper, MD at 12/01/2020 9:09 PM EST     Reviewed  H+P. No changes noted.

## 2020-12-07 NOTE — Transfer of Care (Signed)
Immediate Anesthesia Transfer of Care Note  Patient: Natasha Estrada  Procedure(s) Performed: L2 Kyphoplasty (N/A )  Patient Location: PACU  Anesthesia Type:General  Level of Consciousness: drowsy  Airway & Oxygen Therapy: Patient Spontanous Breathing and Patient connected to face mask oxygen  Post-op Assessment: Report given to RN and Post -op Vital signs reviewed and stable  Post vital signs: Reviewed and stable  Last Vitals:  Vitals Value Taken Time  BP 116/75 12/07/20 1358  Temp    Pulse 77 12/07/20 1400  Resp 10 12/07/20 1400  SpO2 100 % 12/07/20 1400  Vitals shown include unvalidated device data.  Last Pain:  Vitals:   12/07/20 1223  TempSrc: Oral  PainSc: 6          Complications: No complications documented.

## 2020-12-07 NOTE — Discharge Instructions (Addendum)
AMBULATORY SURGERY  DISCHARGE INSTRUCTIONS   1) The drugs that you were given will stay in your system until tomorrow so for the next 24 hours you should not:  A) Drive an automobile B) Make any legal decisions C) Drink any alcoholic beverage   2) You may resume regular meals tomorrow.  Today it is better to start with liquids and gradually work up to solid foods.  You may eat anything you prefer, but it is better to start with liquids, then soup and crackers, and gradually work up to solid foods.   3) Please notify your doctor immediately if you have any unusual bleeding, trouble breathing, redness and pain at the surgery site, drainage, fever, or pain not relieved by medication. 4)   5) Your post-operative visit with Dr.                                     is: Date:                        Time:    Please call to schedule your post-operative visit.  6) Additional Instructions:     Take it easy today and tomorrow. Try to start walking as much as you can after that. Remove Band-Aids and okay to shower on Thursday. Call office if you are having problems.

## 2020-12-07 NOTE — Anesthesia Preprocedure Evaluation (Signed)
Anesthesia Evaluation  Patient identified by MRN, date of birth, ID band Patient awake    Reviewed: Allergy & Precautions, H&P , NPO status , Patient's Chart, lab work & pertinent test results, reviewed documented beta blocker date and time   Airway Mallampati: II  TM Distance: >3 FB Neck ROM: full    Dental no notable dental hx. (+) Teeth Intact   Pulmonary sleep apnea ,    Pulmonary exam normal breath sounds clear to auscultation       Cardiovascular Exercise Tolerance: Good hypertension, On Medications negative cardio ROS   Rhythm:regular Rate:Normal     Neuro/Psych PSYCHIATRIC DISORDERS Anxiety Depression  Neuromuscular disease    GI/Hepatic Neg liver ROS, GERD  Medicated,  Endo/Other  negative endocrine ROSdiabetes  Renal/GU Renal disease     Musculoskeletal   Abdominal   Peds  Hematology negative hematology ROS (+)   Anesthesia Other Findings   Reproductive/Obstetrics negative OB ROS                             Anesthesia Physical Anesthesia Plan  ASA: III  Anesthesia Plan: MAC   Post-op Pain Management:    Induction:   PONV Risk Score and Plan: 3  Airway Management Planned:   Additional Equipment:   Intra-op Plan:   Post-operative Plan:   Informed Consent: I have reviewed the patients History and Physical, chart, labs and discussed the procedure including the risks, benefits and alternatives for the proposed anesthesia with the patient or authorized representative who has indicated his/her understanding and acceptance.       Plan Discussed with: CRNA  Anesthesia Plan Comments:         Anesthesia Quick Evaluation

## 2020-12-08 ENCOUNTER — Encounter: Payer: Self-pay | Admitting: Orthopedic Surgery

## 2020-12-08 LAB — SURGICAL PATHOLOGY

## 2020-12-09 NOTE — Anesthesia Postprocedure Evaluation (Signed)
Anesthesia Post Note  Patient: Natasha Estrada  Procedure(s) Performed: L2 Kyphoplasty (N/A )  Patient location during evaluation: PACU Anesthesia Type: MAC Level of consciousness: awake and alert Pain management: pain level controlled Vital Signs Assessment: post-procedure vital signs reviewed and stable Respiratory status: spontaneous breathing, nonlabored ventilation, respiratory function stable and patient connected to nasal cannula oxygen Cardiovascular status: blood pressure returned to baseline and stable Postop Assessment: no apparent nausea or vomiting Anesthetic complications: no   No complications documented.   Last Vitals:  Vitals:   12/07/20 1445 12/07/20 1505  BP: (!) 120/98 122/88  Pulse: 72 80  Resp: 18   Temp: 36.7 C 36.7 C  SpO2: 98%     Last Pain:  Vitals:   12/07/20 1505  TempSrc:   PainSc: 3                  Molli Barrows

## 2020-12-31 DIAGNOSIS — M2242 Chondromalacia patellae, left knee: Secondary | ICD-10-CM | POA: Insufficient documentation

## 2020-12-31 DIAGNOSIS — M2241 Chondromalacia patellae, right knee: Secondary | ICD-10-CM | POA: Insufficient documentation

## 2021-01-10 ENCOUNTER — Other Ambulatory Visit: Payer: Self-pay

## 2021-01-10 MED ORDER — TRIMETHOPRIM 100 MG PO TABS: 100.0000 mg | ORAL_TABLET | Freq: Every day | ORAL | 2 refills | Status: DC

## 2021-01-10 NOTE — Telephone Encounter (Signed)
Patient called requesting a refill of suppression abx. Ok to refill?

## 2021-01-27 ENCOUNTER — Other Ambulatory Visit: Payer: Self-pay | Admitting: Neurosurgery

## 2021-01-27 DIAGNOSIS — G950 Syringomyelia and syringobulbia: Secondary | ICD-10-CM

## 2021-02-14 ENCOUNTER — Other Ambulatory Visit: Payer: Self-pay

## 2021-02-14 ENCOUNTER — Ambulatory Visit
Admission: RE | Admit: 2021-02-14 | Discharge: 2021-02-14 | Disposition: A | Discharge status: Home / Self Care | Source: Ambulatory Visit | Attending: Neurosurgery | Admitting: Neurosurgery

## 2021-02-14 DIAGNOSIS — G950 Syringomyelia and syringobulbia: Secondary | ICD-10-CM | POA: Diagnosis present

## 2021-02-14 DIAGNOSIS — M5104 Intervertebral disc disorders with myelopathy, thoracic region: Secondary | ICD-10-CM | POA: Insufficient documentation

## 2021-02-14 DIAGNOSIS — S32029A Unspecified fracture of second lumbar vertebra, initial encounter for closed fracture: Secondary | ICD-10-CM | POA: Diagnosis not present

## 2021-03-02 ENCOUNTER — Telehealth: Payer: Self-pay | Admitting: Urology

## 2021-03-02 NOTE — Telephone Encounter (Signed)
Pt has appt tomorrow, pt fell in December 2021 and she think the incontinence started then. Pt was seen by [facility] clinic with "cement in her back".  (see care everywhere 11/25/2020)

## 2021-03-02 NOTE — Telephone Encounter (Signed)
Would you call Natasha Estrada and ask her when she fell and when did the incontinence start a relationship to the fall?  I do not see where she has been evaluated through her primary care doctor or the emergency room for the fall.  My concern is if she fell and then very soon after developed urinary incontinence, she may have damaged her spine and will need to seek treatment in the emergency room.

## 2021-03-02 NOTE — Progress Notes (Signed)
09/16/2020 8:21 AM   Natasha Estrada Jun 02, 1971 [id number]  Referring provider: Wayland Denis, PA-C [address] [place],  [place] [address] Chief Complaint  Patient presents with  . Urinary Incontinence   Urological history: 1. Possible small proteinaceous/hemorrhagic cyst -Incidental finding found on recent Lumbar MRI in 07/2020.  -Renal mass protocol MRI scheduled 10/14/2020 Too small to characterize on post-contrast imaging, but strongly favored to represent a hemorrhagic cyst (especially given size regression).  This could be re-evaluated with follow-up pre and post contrast abdominal MRI at 1 year, if desired. -repeat MRI ordered for 10/2021  2.  Nephrolithiasis -CT renal stone study 09/2020 Bilateral nonobstructing renal calculi are again identified with stable burden  3. rUTI's - risk factors of age, constipation and incontinence -documented positive urine culture over the last year  E. coli resistant to Cipro and Levaquin on Apr 25, 2000  E. coli resistant to Cipro and Levaquin on September 16, 2020     HPI: Natasha Estrada is a 50 y.o. female who returns for incontinence after suffering a fall.  She states that she fell in December, but the incontinence episodes did not start until several weeks ago.  She states that she has had 3 instances where she fell asleep in the chair and woke up soaking wet with urine.  She does not experience incontinence at any other time.  She states she feels that she has daytime frequency, but she cannot tell the exact number of trips she makes to the bathroom during the day.  She states she has nocturia x1, but she has not had no nighttime incontinent episodes.  Patient denies any modifying or aggravating factors.  Patient denies any gross hematuria, dysuria or suprapubic/flank pain.  Patient denies any fevers, chills, nausea or vomiting.   UA moderate bacteria  PVR 0 mL  PMH: Past Medical History:  Diagnosis  Date  . Anxiety   . Arthritis   . Chronic low back pain with left-sided sciatica   . Chronic pain syndrome   . Depression   . Fibroid   . GERD (gastroesophageal reflux disease)   . History of kidney stones   . Hypertension   . IBS (irritable bowel syndrome)    with constipation  . Insomnia   . Kidney lesion, native, left 09/2020   following renal mass protocol on 10/14/20  . Kidney stone   . Lumbar radiculopathy   . Radiculitis, cervical    c7-c8  . Sleep apnea   . 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 Urinary tract infection 10/2020   recurrent. on daily antibix treatment    Surgical History: Past Surgical History:  Procedure Laterality Date  . BLADDER STONE REMOVAL  2018  . CERVICAL CONE BIOPSY  2018  . CHOLECYSTECTOMY    . GASTRIC ROUX-EN-Y  2013  . KYPHOPLASTY N/A 12/07/2020   Procedure: L2 Kyphoplasty;  Surgeon: Hessie Knows, MD;  Location: [facility] ORS;  Service: Orthopedics;  Laterality: N/A;  . ROTATOR CUFF REPAIR Left 2010  . SHOULDER ARTHROSCOPY WITH SUBACROMIAL DECOMPRESSION AND OPEN ROTATOR C Left 10/19/2020   Procedure: LEFT SHOULDER ARTHROSCOPY WITH DEBRIDEMENT DECOMPRESSION AND POSSIBLE BICEP TENODESIS;  Surgeon: Corky Mull, MD;  Location: [facility] ORS;  Service: Orthopedics;  Laterality: Left;  . transforaminal epidural steroid injection Left 10/01/2020   C6-7    Home Medications:  Allergies as of 03/03/2021      Reactions   Cefuroxime Shortness Of Breath   Tolerates Augmentin per husband Joe  Sulfa Antibiotics Rash      Medication List       Accurate as of March 03, 2021 11:59 PM. If you have any questions, ask your nurse or doctor.        buPROPion 150 MG 24 hr tablet Commonly known as: WELLBUTRIN XL Take 150 mg by mouth daily.   busPIRone 7.5 MG tablet Commonly known as: BUSPAR Take 7.5 mg by mouth 2 (two) times daily.   cholecalciferol 25 MCG (1000 UNIT) tablet Commonly known as: VITAMIN D3 Take 1,000 Units by mouth daily.    cyclobenzaprine 10 MG tablet Commonly known as: FLEXERIL Take 10 mg by mouth every evening.   DULoxetine 60 MG capsule Commonly known as: CYMBALTA Take 60 mg by mouth daily.   gabapentin 300 MG capsule Commonly known as: NEURONTIN Take 600 mg by mouth at bedtime.   gabapentin 100 MG capsule Commonly known as: NEURONTIN Take 100 mg by mouth daily as needed (pain).   ibuprofen 800 MG tablet Commonly known as: ADVIL Take 800 mg by mouth every 6 (six) hours as needed for moderate pain.   levonorgestrel 20 MCG/24HR IUD Commonly known as: MIRENA by Intrauterine route.   linaclotide 145 MCG Caps capsule Commonly known as: LINZESS Take 145 mcg by mouth daily before breakfast.   losartan 25 MG tablet Commonly known as: COZAAR Take 25 mg by mouth daily.   omeprazole 20 MG capsule Commonly known as: PRILOSEC Take 20 mg by mouth daily.   oxyCODONE 5 MG immediate release tablet Commonly known as: Roxicodone Take 1 tablet (5 mg total) by mouth every 4 (four) hours as needed for moderate pain or severe pain.   propranolol 40 MG tablet Commonly known as: INDERAL Take 40 mg by mouth at bedtime.   pseudoephedrine 120 MG 12 hr tablet Commonly known as: SUDAFED Take 120 mg by mouth daily as needed for congestion.   trimethoprim 100 MG tablet Commonly known as: TRIMPEX Take 1 tablet (100 mg total) by mouth daily.   vitamin B-12 1000 MCG tablet Commonly known as: CYANOCOBALAMIN Take 1,000 mcg by mouth daily.   zolpidem 10 MG tablet Commonly known as: AMBIEN Take 10 mg by mouth at bedtime.       Allergies:  Allergies  Allergen Reactions  . Cefuroxime Shortness Of Breath    Tolerates Augmentin per husband Joe  . Sulfa Antibiotics Rash    Family History: No family history on file.  Social History:  reports that she has never smoked. She has never used smokeless tobacco. She reports previous alcohol use. She reports that she does not use drugs.   Physical  Exam: BP 136/80   Pulse 83   Ht 5\' 4"  (1.626 m)   Wt 188 lb (85.3 kg)   BMI 32.27 kg/m   Constitutional:  Well nourished. Alert and oriented, No acute distress. HEENT: [place] AT, mask in place.  Trachea midline Cardiovascular: No clubbing, cyanosis, or edema. Respiratory: Normal respiratory effort, no increased work of breathing. Neurologic: Grossly intact, no focal deficits, moving all 4 extremities. Psychiatric: Normal mood and affect.   Laboratory Data: Urinalysis Component     Latest Ref Rng & Units 03/03/2021  Specific Gravity, UA     1.005 - 1.030 1.015  pH, UA     5.0 - 7.5 5.5  Color, UA     Yellow Yellow  Appearance Ur     Clear Cloudy (A)  Leukocytes,UA     Negative Trace (A)  Protein,UA  Negative/Trace Negative  Glucose, UA     Negative Negative  Ketones, UA     Negative Negative  RBC, UA     Negative Negative  Bilirubin, UA     Negative Negative  Urobilinogen, Ur     0.2 - 1.0 mg/dL 0.2  Nitrite, UA     Negative Negative  Microscopic Examination      See below:   Component     Latest Ref Rng & Units 03/03/2021  WBC, UA     0 - 5 /hpf 0-5  RBC     0 - 2 /hpf 0-2  Epithelial Cells (non renal)     0 - 10 /hpf >10 (A)  Crystals     N/A Present (A)  Crystal Type     N/A Calcium Oxalate  Bacteria, UA     None seen/Few Moderate (A)  I have reviewed the labs.  Pertinent Imaging Results for LUISANA, LUTZKE "GAGANDEEP KOSSMAN" (MRN [id number]) as of 03/03/2021 12:07  Ref. Range 03/03/2021 11:54  Scan Result Unknown 0 ml    Assessment & Plan:    1. Incontinence -I will send her urine for culture to rule out infection as a cause for her incontinent episodes.  If the urine culture is negative or the incontinence persists after treating the urinary tract infection, I will refer her on to Dr. Matilde Sprang for his expertise.   2. Recurrent UTI -urine culture pending  3. History of nephrolithiasis Pt had 1 stone episode. CT from 06/14/2020 noted evidence of  a passed left renal stone since May, but no acute ureteral calculus or obstructive uropathy.  4. Possible small proteinaceous/hemorrhagic cyst.   Renal mass protocol MRI scheduled for 10/14/2020  [facility] [address], [place], [place] [address] [phone]   Zara Council, PA-C

## 2021-03-03 ENCOUNTER — Ambulatory Visit (INDEPENDENT_AMBULATORY_CARE_PROVIDER_SITE_OTHER): Payer: BC Managed Care – PPO | Admitting: Urology

## 2021-03-03 ENCOUNTER — Encounter: Payer: Self-pay | Admitting: Urology

## 2021-03-03 ENCOUNTER — Other Ambulatory Visit: Payer: Self-pay

## 2021-03-03 VITALS — BP 136/80 | HR 83 | Ht 64.0 in | Wt 188.0 lb

## 2021-03-03 DIAGNOSIS — N3946 Mixed incontinence: Secondary | ICD-10-CM | POA: Diagnosis not present

## 2021-03-03 LAB — URINALYSIS, COMPLETE
Bilirubin, UA: NEGATIVE
Glucose, UA: NEGATIVE
Ketones, UA: NEGATIVE
Nitrite, UA: NEGATIVE
Protein,UA: NEGATIVE
RBC, UA: NEGATIVE
Specific Gravity, UA: 1.015 (ref 1.005–1.030)
Urobilinogen, Ur: 0.2 mg/dL (ref 0.2–1.0)
pH, UA: 5.5 (ref 5.0–7.5)

## 2021-03-03 LAB — BLADDER SCAN AMB NON-IMAGING: Scan Result: 0

## 2021-03-03 LAB — MICROSCOPIC EXAMINATION: Epithelial Cells (non renal): >10 /hpf — AB (ref 0–10)

## 2021-03-07 LAB — CULTURE, URINE COMPREHENSIVE

## 2021-03-21 ENCOUNTER — Other Ambulatory Visit: Payer: Self-pay | Admitting: Surgery

## 2021-03-21 DIAGNOSIS — Z9889 Other specified postprocedural states: Secondary | ICD-10-CM

## 2021-03-21 DIAGNOSIS — M75112 Incomplete rotator cuff tear or rupture of left shoulder, not specified as traumatic: Secondary | ICD-10-CM

## 2021-03-21 DIAGNOSIS — M7582 Other shoulder lesions, left shoulder: Secondary | ICD-10-CM

## 2021-03-23 DIAGNOSIS — F331 Major depressive disorder, recurrent, moderate: Secondary | ICD-10-CM | POA: Insufficient documentation

## 2021-03-23 DIAGNOSIS — E782 Mixed hyperlipidemia: Secondary | ICD-10-CM | POA: Insufficient documentation

## 2021-03-23 DIAGNOSIS — E559 Vitamin D deficiency, unspecified: Secondary | ICD-10-CM | POA: Insufficient documentation

## 2021-03-23 DIAGNOSIS — F411 Generalized anxiety disorder: Secondary | ICD-10-CM | POA: Insufficient documentation

## 2021-03-23 DIAGNOSIS — K581 Irritable bowel syndrome with constipation: Secondary | ICD-10-CM | POA: Insufficient documentation

## 2021-03-23 DIAGNOSIS — E538 Deficiency of other specified B group vitamins: Secondary | ICD-10-CM | POA: Insufficient documentation

## 2021-03-28 ENCOUNTER — Ambulatory Visit: Payer: BC Managed Care – PPO | Admitting: Urology

## 2021-03-28 ENCOUNTER — Encounter: Payer: Self-pay | Admitting: Urology

## 2021-04-15 ENCOUNTER — Other Ambulatory Visit: Payer: Self-pay

## 2021-04-15 ENCOUNTER — Ambulatory Visit
Admission: RE | Admit: 2021-04-15 | Discharge: 2021-04-15 | Disposition: A | Discharge status: Home / Self Care | Source: Ambulatory Visit | Attending: Surgery | Admitting: Surgery

## 2021-04-15 DIAGNOSIS — Z9889 Other specified postprocedural states: Secondary | ICD-10-CM | POA: Insufficient documentation

## 2021-04-15 DIAGNOSIS — M75112 Incomplete rotator cuff tear or rupture of left shoulder, not specified as traumatic: Secondary | ICD-10-CM | POA: Diagnosis present

## 2021-04-15 DIAGNOSIS — M7582 Other shoulder lesions, left shoulder: Secondary | ICD-10-CM | POA: Diagnosis present

## 2021-04-20 ENCOUNTER — Other Ambulatory Visit
Admission: RE | Admit: 2021-04-20 | Discharge: 2021-04-20 | Disposition: A | Discharge status: Home / Self Care | Payer: Self-pay | Source: Ambulatory Visit | Attending: Sports Medicine | Admitting: Sports Medicine

## 2021-04-20 DIAGNOSIS — M25512 Pain in left shoulder: Secondary | ICD-10-CM | POA: Insufficient documentation

## 2021-04-20 DIAGNOSIS — M7552 Bursitis of left shoulder: Secondary | ICD-10-CM | POA: Insufficient documentation

## 2021-04-20 LAB — SYNOVIAL CELL COUNT + DIFF, W/ CRYSTALS
Crystals, Fluid: NONE SEEN
Eosinophils-Synovial: 2 %
Lymphocytes-Synovial Fld: 44 %
Monocyte-Macrophage-Synovial Fluid: 15 %
Neutrophil, Synovial: 39 %
WBC, Synovial: 541 /mm3 — ABNORMAL HIGH (ref 0–200)

## 2021-04-28 ENCOUNTER — Ambulatory Visit: Payer: BC Managed Care – PPO | Admitting: Urology

## 2021-04-28 ENCOUNTER — Encounter: Payer: Self-pay | Admitting: Urology

## 2021-04-28 ENCOUNTER — Other Ambulatory Visit: Payer: Self-pay

## 2021-04-28 VITALS — BP 125/90 | HR 75 | Ht 64.0 in | Wt 188.0 lb

## 2021-04-28 DIAGNOSIS — N200 Calculus of kidney: Secondary | ICD-10-CM

## 2021-04-28 DIAGNOSIS — N390 Urinary tract infection, site not specified: Secondary | ICD-10-CM | POA: Diagnosis not present

## 2021-04-28 MED ORDER — AMOXICILLIN-POT CLAVULANATE 875-125 MG PO TABS
1.0000 | ORAL_TABLET | Freq: Two times a day (BID) | ORAL | 0 refills | Status: DC
Start: 2021-04-28 — End: 2021-08-18

## 2021-04-28 NOTE — Progress Notes (Signed)
09/16/2020 4:18 PM   Natasha Estrada July 20, 1971 [id number]  Referring provider: Wayland Denis, PA-C [address] [place],  [place] [address] Chief Complaint  Patient presents with  . Urinary Incontinence   Urological history: 1. Possible small proteinaceous/hemorrhagic cyst -Incidental finding found on recent Lumbar MRI in 07/2020.  -Renal mass protocol MRI scheduled 10/14/2020 Too small to characterize on post-contrast imaging, but strongly favored to represent a hemorrhagic cyst (especially given size regression).  This could be re-evaluated with follow-up pre and post contrast abdominal MRI at 1 year, if desired. -repeat MRI ordered for 10/2021  2.  Nephrolithiasis -CT renal stone study 09/2020 Bilateral nonobstructing renal calculi are again identified with stable burden  3. rUTI's - risk factors of age, constipation and incontinence -cysto 05/2020 was NED  -documented positive urine culture over the last year  E. coli resistant to Cipro and Levaquin on Apr 25, 2000  E. coli resistant to Cipro and Levaquin on September 16, 2020   HPI: Natasha Estrada is a 50 y.o. female who presents for left kidney spasms, foul urine and internal pain.    She states a few days ago she started experiencing spasms in the left flank area and nowhere pain in her genital area.  The flank pain radiates from the left flank to the left waist.  Patient denies any modifying or aggravating factors.  Patient denies any gross hematuria, dysuria or suprapubic/flank pain.  Patient denies any fevers, chills, nausea or vomiting.   UA 6-10 WBC's, 3-10 RBC's, > 10 epi's, calcium oxalate crystals and moderate bacteria.    She states that her incontinence has resolved.    PMH: Past Medical History:  Diagnosis Date  . Anxiety   . Arthritis   . Chronic low back pain with left-sided sciatica   . Chronic pain syndrome   . Depression   . Fibroid   . GERD (gastroesophageal reflux  disease)   . History of kidney stones   . Hypertension   . IBS (irritable bowel syndrome)    with constipation  . Insomnia   . Kidney lesion, native, left 09/2020   following renal mass protocol on 10/14/20  . Kidney stone   . Lumbar radiculopathy   . Radiculitis, cervical    c7-c8  . Sleep apnea   . 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 Urinary tract infection 10/2020   recurrent. on daily antibix treatment    Surgical History: Past Surgical History:  Procedure Laterality Date  . BLADDER STONE REMOVAL  2018  . CERVICAL CONE BIOPSY  2018  . CHOLECYSTECTOMY    . GASTRIC ROUX-EN-Y  2013  . KYPHOPLASTY N/A 12/07/2020   Procedure: L2 Kyphoplasty;  Surgeon: Hessie Knows, MD;  Location: [facility] ORS;  Service: Orthopedics;  Laterality: N/A;  . ROTATOR CUFF REPAIR Left 2010  . SHOULDER ARTHROSCOPY WITH SUBACROMIAL DECOMPRESSION AND OPEN ROTATOR C Left 10/19/2020   Procedure: LEFT SHOULDER ARTHROSCOPY WITH DEBRIDEMENT DECOMPRESSION AND POSSIBLE BICEP TENODESIS;  Surgeon: Corky Mull, MD;  Location: [facility] ORS;  Service: Orthopedics;  Laterality: Left;  . transforaminal epidural steroid injection Left 10/01/2020   C6-7    Home Medications:  Allergies as of 04/28/2021      Reactions   Cefuroxime Shortness Of Breath   Tolerates Augmentin per husband Joe   Sulfa Antibiotics Rash      Medication List       Accurate as of Apr 28, 2021 11:59 PM. If you have any questions, ask  your nurse or doctor.        amoxicillin-clavulanate 875-125 MG tablet Commonly known as: AUGMENTIN Take 1 tablet by mouth every 12 (twelve) hours. Started by: Zara Council, PA-C   buPROPion 150 MG 24 hr tablet Commonly known as: WELLBUTRIN XL Take 150 mg by mouth daily.   busPIRone 7.5 MG tablet Commonly known as: BUSPAR Take 7.5 mg by mouth 2 (two) times daily.   cholecalciferol 25 MCG (1000 UNIT) tablet Commonly known as: VITAMIN D3 Take 1,000 Units by mouth daily.   cyclobenzaprine 10 MG  tablet Commonly known as: FLEXERIL Take 10 mg by mouth every evening.   DULoxetine 60 MG capsule Commonly known as: CYMBALTA Take 60 mg by mouth daily.   gabapentin 300 MG capsule Commonly known as: NEURONTIN Take 600 mg by mouth at bedtime.   gabapentin 100 MG capsule Commonly known as: NEURONTIN Take 100 mg by mouth daily as needed (pain).   ibuprofen 800 MG tablet Commonly known as: ADVIL Take 800 mg by mouth every 6 (six) hours as needed for moderate pain.   levonorgestrel 20 MCG/24HR IUD Commonly known as: MIRENA by Intrauterine route.   linaclotide 145 MCG Caps capsule Commonly known as: LINZESS Take 145 mcg by mouth daily before breakfast.   losartan 50 MG tablet Commonly known as: COZAAR Take 1 tablet by mouth daily. What changed: Another medication with the same name was removed. Continue taking this medication, and follow the directions you see here. Changed by: Zara Council, PA-C   omeprazole 20 MG capsule Commonly known as: PRILOSEC Take 20 mg by mouth daily.   oxyCODONE 5 MG immediate release tablet Commonly known as: Roxicodone Take 1 tablet (5 mg total) by mouth every 4 (four) hours as needed for moderate pain or severe pain.   propranolol 40 MG tablet Commonly known as: INDERAL Take 40 mg by mouth at bedtime.   pseudoephedrine 120 MG 12 hr tablet Commonly known as: SUDAFED Take 120 mg by mouth daily as needed for congestion.   trimethoprim 100 MG tablet Commonly known as: TRIMPEX Take 1 tablet (100 mg total) by mouth daily.   vitamin B-12 1000 MCG tablet Commonly known as: CYANOCOBALAMIN Take 1,000 mcg by mouth daily.   zolpidem 10 MG tablet Commonly known as: AMBIEN Take 10 mg by mouth at bedtime.       Allergies:  Allergies  Allergen Reactions  . Cefuroxime Shortness Of Breath    Tolerates Augmentin per husband Joe  . Sulfa Antibiotics Rash    Family History: No family history on file.  Social History:  reports that she  has never smoked. She has never used smokeless tobacco. She reports previous alcohol use. She reports that she does not use drugs.   Physical Exam: BP 125/90   Pulse 75   Ht 5' 4"  (1.626 m)   Wt 188 lb (85.3 kg)   BMI 32.27 kg/m   Constitutional:  Well nourished. Alert and oriented, No acute distress. HEENT: [place] AT, mask in place.  Trachea midline Cardiovascular: No clubbing, cyanosis, or edema. Respiratory: Normal respiratory effort, no increased work of breathing. Neurologic: Grossly intact, no focal deficits, moving all 4 extremities. Psychiatric: Normal mood and affect.   Laboratory Data: WBC (White Blood Cell Count) 4.1 - 10.2 10^3/uL 7.5   RBC (Red Blood Cell Count) 4.04 - 5.48 10^6/uL 4.12   Hemoglobin 12.0 - 15.0 gm/dL 12.0   Hematocrit 35.0 - 47.0 % 38.1   MCV (Mean Corpuscular Volume) 80.0 - 100.0  fl 92.5   MCH (Mean Corpuscular Hemoglobin) 27.0 - 31.2 pg 29.1   MCHC (Mean Corpuscular Hemoglobin Concentration) 32.0 - 36.0 gm/dL 31.5Low   Platelet Count 150 - 450 10^3/uL 295   RDW-CV (Red Cell Distribution Width) 11.6 - 14.8 % 13.9   MPV (Mean Platelet Volume) 9.4 - 12.4 fl 10.4   Neutrophils 1.50 - 7.80 10^3/uL 5.20   Lymphocytes 1.00 - 3.60 10^3/uL 1.30   Monocytes 0.00 - 1.50 10^3/uL 0.73   Eosinophils 0.00 - 0.55 10^3/uL 0.15   Basophils 0.00 - 0.09 10^3/uL 0.08   Neutrophil % 32.0 - 70.0 % 69.6   Lymphocyte % 10.0 - 50.0 % 17.4   Monocyte % 4.0 - 13.0 % 9.8   Eosinophil % 1.0 - 5.0 % 2.0   Basophil% 0.0 - 2.0 % 1.1   Immature Granulocyte % <=0.7 % 0.1   Immature Granulocyte Count <=0.06 10^3/L 0.01   Resulting Agency  [facility] - [facility]  Specimen Collected: 03/22/21 09:10 Last Resulted: 03/22/21 13:08  Received From: [facility]  Result Received: 03/23/21 13:38    Ref Range & Units 1 mo ago  Hemoglobin A1C 4.2 - 5.6 % 5.6   Average Blood Glucose (Calc) mg/dL 114   Resulting Agency  [facility] - [facility]    Narrative Performed by [facility] - [facility] Normal Range:  4.2 - 5.6%  Increased Risk: 5.7 - 6.4%  Diabetes:    >= 6.5%  Glycemic Control for adults with diabetes: <7%  Specimen Collected: 03/22/21 09:10 Last Resulted: 03/22/21 16:36  Received From: [facility]  Result Received: 03/23/21 13:38   Glucose 70 - 110 mg/dL 88   Sodium 136 - 145 mmol/L 144   Potassium 3.6 - 5.1 mmol/L 4.2   Chloride 97 - 109 mmol/L 111High   Carbon Dioxide (CO2) 22.0 - 32.0 mmol/L 20.6Low   Urea Nitrogen (BUN) 7 - 25 mg/dL 13   Creatinine 0.6 - 1.1 mg/dL 1.0   Glomerular Filtration Rate (eGFR), MDRD Estimate >60 mL/min/1.73sq m 58Low   Calcium 8.7 - 10.3 mg/dL 9.0   AST  8 - 39 U/L 15   ALT  5 - 38 U/L 16   Alk Phos (alkaline Phosphatase) 34 - 104 U/L 107High   Albumin 3.5 - 4.8 g/dL 4.0   Bilirubin, Total 0.3 - 1.2 mg/dL 0.7   Protein, Total 6.1 - 7.9 g/dL 6.0Low   A/G Ratio 1.0 - 5.0 gm/dL 2.0   Resulting Agency  [facility] - [facility]  Specimen Collected: 03/22/21 09:10 Last Resulted: 03/22/21 15:23  Received From: [facility]  Result Received: 03/23/21 13:38   Urinalysis Component     Latest Ref Rng & Units 04/28/2021  Specific Gravity, UA     1.005 - 1.030 >1.030 (H)  pH, UA     5.0 - 7.5 5.5  Color, UA     Yellow Orange  Appearance Ur     Clear Cloudy (A)  Leukocytes,UA     Negative 1+ (A)  Protein,UA     Negative/Trace 1+ (A)  Glucose, UA     Negative Trace (A)  Ketones, UA     Negative Trace (A)  RBC, UA     Negative 1+ (A)  Bilirubin, UA     Negative Negative  Urobilinogen, Ur     0.2 - 1.0 mg/dL 0.2  Nitrite, UA     Negative Negative  Microscopic Examination  See below:   Component     Latest Ref Rng & Units 04/28/2021  WBC, UA     0 - 5 /hpf 6-10 (A)  RBC     0 - 2 /hpf 3-10 (A)  Epithelial Cells (non renal)     0 - 10 /hpf >10 (A)  Crystals     N/A Present (A)  Crystal Type     N/A  Calcium Oxalate  Bacteria, UA     None seen/Few Moderate (A)  I have reviewed the labs.  Pertinent Imaging No recent imaging   Assessment & Plan:    1. Incontinence -Resolved      2. Recurrent UTI -UA moderate bacteria and micro heme -urine culture pending -started on Augmentin until culture and sensitivities return and will adjust if neccessary  3. Bilateral nephrolithiasis -calcium oxalate crystals found in UA  -if micro heme persist despite treatment with antibiotics or urine culture is negative, we obtain a KUB  4. Possible small proteinaceous/hemorrhagic cyst.   Renal mass protocol MRI scheduled for 10/2021  [facility] [address] [place], [place] [address] [phone]   Zara Council, PA-C

## 2021-04-29 DIAGNOSIS — M810 Age-related osteoporosis without current pathological fracture: Secondary | ICD-10-CM | POA: Insufficient documentation

## 2021-04-29 LAB — URINALYSIS, COMPLETE
Bilirubin, UA: NEGATIVE
Nitrite, UA: NEGATIVE
Specific Gravity, UA: >1.03 — ABNORMAL HIGH (ref 1.005–1.030)
Urobilinogen, Ur: 0.2 mg/dL (ref 0.2–1.0)
pH, UA: 5.5 (ref 5.0–7.5)

## 2021-04-29 LAB — MICROSCOPIC EXAMINATION: Epithelial Cells (non renal): >10 /hpf — AB (ref 0–10)

## 2021-05-02 LAB — CULTURE, URINE COMPREHENSIVE

## 2021-05-05 ENCOUNTER — Telehealth: Payer: Self-pay | Admitting: Urology

## 2021-05-05 DIAGNOSIS — N200 Calculus of kidney: Secondary | ICD-10-CM

## 2021-05-05 NOTE — Telephone Encounter (Signed)
Please let Natasha Estrada know that her urine culture was negative and I recommend she have a KUB to rule out stones.

## 2021-05-06 NOTE — Telephone Encounter (Signed)
Patient notified, she is not able to go today for KUB but will go next week. Order was placed, she was told we will call with results

## 2021-05-11 ENCOUNTER — Other Ambulatory Visit: Payer: Self-pay

## 2021-05-11 NOTE — Telephone Encounter (Signed)
Patient called stating she needs a refill on her suppression abx.  Last script was sent in with 2 refills.  Will she continue long term? Please advise if ok to send in refills for a year per patient request

## 2021-05-20 MED ORDER — TRIMETHOPRIM 100 MG PO TABS: 100.0000 mg | ORAL_TABLET | Freq: Every day | ORAL | 0 refills | Status: DC

## 2021-05-20 NOTE — Telephone Encounter (Signed)
Patient previously notified of this, see telephone encounter

## 2021-05-20 NOTE — Telephone Encounter (Signed)
Patient still has not gone for KUB attempted to reach again by phone no answer mail box full, my chart message sent

## 2021-05-31 NOTE — Progress Notes (Deleted)
09/16/2020 8:56 AM   Natasha Estrada 01-09-1971 [id number]  Referring provider: Wayland Denis, PA-C [address] [place],  [place] [address] No chief complaint on file.  Urological history: 1. Possible small proteinaceous/hemorrhagic cyst -Incidental finding found on recent Lumbar MRI in 07/2020.  -Renal mass protocol MRI scheduled 10/14/2020 Too small to characterize on post-contrast imaging, but strongly favored to represent a hemorrhagic cyst (especially given size regression).  This could be re-evaluated with follow-up pre and post contrast abdominal MRI at 1 year, if desired. -repeat MRI ordered for 10/2021  2.  Nephrolithiasis -CT renal stone study 09/2020 Bilateral nonobstructing renal calculi are again identified with stable burden  3. rUTI's - risk factors of age, constipation and incontinence -cysto 05/2020 was NED  -documented positive urine culture over the last year  E. coli resistant to Cipro and Levaquin on Apr 25, 2000  E. coli resistant to Cipro and Levaquin on September 16, 2020   HPI: Natasha Estrada is a 50 y.o. female who presents for left kidney spasms, foul urine and internal pain.    She states a few days ago she started experiencing spasms in the left flank area and nowhere pain in her genital area.  The flank pain radiates from the left flank to the left waist.  Patient denies any modifying or aggravating factors.  Patient denies any gross hematuria, dysuria or suprapubic/flank pain.  Patient denies any fevers, chills, nausea or vomiting.   UA 6-10 WBC's, 3-10 RBC's, > 10 epi's, calcium oxalate crystals and moderate bacteria.    She states that her incontinence has resolved.    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 on daily antibix treatment    Surgical History: Past Surgical History:  Procedure Laterality Date   BLADDER STONE REMOVAL  2018   CERVICAL CONE BIOPSY  2018   CHOLECYSTECTOMY     GASTRIC ROUX-EN-Y  2013   KYPHOPLASTY N/A 12/07/2020   Procedure: L2 Kyphoplasty;  Surgeon: Hessie Knows, MD;  Location: [facility] ORS;  Service: Orthopedics;  Laterality: N/A;   ROTATOR CUFF REPAIR Left 2010   SHOULDER ARTHROSCOPY WITH SUBACROMIAL DECOMPRESSION AND OPEN ROTATOR C Left 10/19/2020   Procedure: LEFT SHOULDER ARTHROSCOPY WITH DEBRIDEMENT DECOMPRESSION AND POSSIBLE BICEP TENODESIS;  Surgeon: Corky Mull, MD;  Location: [facility] ORS;  Service: Orthopedics;  Laterality: Left;   transforaminal epidural steroid injection Left 10/01/2020   C6-7    Home Medications:  Allergies as of 06/01/2021       Reactions   Cefuroxime Shortness Of Breath   Tolerates Augmentin per husband Joe   Sulfa Antibiotics Rash        Medication List        Accurate as of May 31, 2021  8:56 AM. If you have any questions, ask your  nurse or doctor.          amoxicillin-clavulanate 875-125 MG tablet Commonly known as: AUGMENTIN Take 1 tablet by mouth every 12 (twelve) hours.   buPROPion 150 MG 24 hr tablet Commonly known as: WELLBUTRIN XL Take 150 mg by mouth daily.   busPIRone 7.5 MG tablet Commonly known as: BUSPAR Take 7.5 mg by mouth 2 (two) times daily.   cholecalciferol 25 MCG (1000 UNIT) tablet Commonly known as: VITAMIN D3 Take 1,000 Units by mouth daily.   cyclobenzaprine 10 MG tablet Commonly known as: FLEXERIL Take 10 mg by mouth every evening.   DULoxetine 60  MG capsule Commonly known as: CYMBALTA Take 60 mg by mouth daily.   gabapentin 300 MG capsule Commonly known as: NEURONTIN Take 600 mg by mouth at bedtime.   gabapentin 100 MG capsule Commonly known as: NEURONTIN Take 100 mg by mouth daily as needed (pain).   ibuprofen 800 MG tablet Commonly known as: ADVIL Take 800 mg by mouth every 6 (six) hours as needed for moderate pain.   levonorgestrel 20 MCG/24HR IUD Commonly known as: MIRENA by Intrauterine route.   linaclotide 145 MCG Caps capsule Commonly known as: LINZESS Take 145 mcg by mouth daily before breakfast.   losartan 50 MG tablet Commonly known as: COZAAR Take 1 tablet by mouth daily.   omeprazole 20 MG capsule Commonly known as: PRILOSEC Take 20 mg by mouth daily.   oxyCODONE 5 MG immediate release tablet Commonly known as: Roxicodone Take 1 tablet (5 mg total) by mouth every 4 (four) hours as needed for moderate pain or severe pain.   propranolol 40 MG tablet Commonly known as: INDERAL Take 40 mg by mouth at bedtime.   pseudoephedrine 120 MG 12 hr tablet Commonly known as: SUDAFED Take 120 mg by mouth daily as needed for congestion.   trimethoprim 100 MG tablet Commonly known as: TRIMPEX Take 1 tablet (100 mg total) by mouth daily.   vitamin B-12 1000 MCG tablet Commonly known as: CYANOCOBALAMIN Take 1,000 mcg by mouth daily.   zolpidem 10 MG tablet Commonly known as: AMBIEN Take 10 mg by mouth at bedtime.        Allergies:  Allergies  Allergen Reactions   Cefuroxime Shortness Of Breath    Tolerates Augmentin per husband Joe   Sulfa Antibiotics Rash    Family History: No family history on file.  Social History:  reports that she has never smoked. She has never used smokeless tobacco. She reports previous alcohol use. She reports that she does not use drugs.   Physical Exam: There were no vitals taken for this visit.  Constitutional:  Well nourished. Alert and oriented, No acute  distress. HEENT:  AT, mask in place.  Trachea midline Cardiovascular: No clubbing, cyanosis, or edema. Respiratory: Normal respiratory effort, no increased work of breathing. Neurologic: Grossly intact, no focal deficits, moving all 4 extremities. Psychiatric: Normal mood and affect.   Laboratory Data: WBC (White Blood Cell Count) 4.1 - 10.2 10^3/uL 7.5   RBC (Red Blood Cell Count) 4.04 - 5.48 10^6/uL 4.12   Hemoglobin 12.0 - 15.0 gm/dL 12.0   Hematocrit 35.0 - 47.0 % 38.1   MCV (Mean Corpuscular Volume) 80.0 - 100.0 fl 92.5   MCH (Mean Corpuscular Hemoglobin) 27.0 - 31.2 pg 29.1   MCHC (Mean Corpuscular Hemoglobin Concentration) 32.0 - 36.0 gm/dL 31.5 Low    Platelet Count 150 - 450 10^3/uL 295   RDW-CV (Red Cell Distribution Width) 11.6 - 14.8 %  13.9   MPV (Mean Platelet Volume) 9.4 - 12.4 fl 10.4   Neutrophils 1.50 - 7.80 10^3/uL 5.20   Lymphocytes 1.00 - 3.60 10^3/uL 1.30   Monocytes 0.00 - 1.50 10^3/uL 0.73   Eosinophils 0.00 - 0.55 10^3/uL 0.15   Basophils 0.00 - 0.09 10^3/uL 0.08   Neutrophil % 32.0 - 70.0 % 69.6   Lymphocyte % 10.0 - 50.0 % 17.4   Monocyte % 4.0 - 13.0 % 9.8   Eosinophil % 1.0 - 5.0 % 2.0   Basophil% 0.0 - 2.0 % 1.1   Immature Granulocyte % <=0.7 % 0.1   Immature Granulocyte Count <=0.06 10^3/L 0.01   Resulting Agency  [facility] - [facility]  Specimen Collected: 03/22/21 09:10 Last Resulted: 03/22/21 13:08  Received From: [facility]  Result Received: 03/23/21 13:38    Ref Range & Units 1 mo ago  Hemoglobin A1C 4.2 - 5.6 % 5.6   Average Blood Glucose (Calc) mg/dL 114   Resulting Agency  [facility] - [facility]   Narrative Performed by [facility] - [facility] Normal Range:    4.2 - 5.6%  Increased Risk:  5.7 - 6.4%  Diabetes:        >= 6.5%  Glycemic Control for adults with diabetes:  <7%   Specimen Collected: 03/22/21 09:10 Last Resulted: 03/22/21 16:36  Received From: [facility]  Result Received:  03/23/21 13:38   Glucose 70 - 110 mg/dL 88   Sodium 136 - 145 mmol/L 144   Potassium 3.6 - 5.1 mmol/L 4.2   Chloride 97 - 109 mmol/L 111 High    Carbon Dioxide (CO2) 22.0 - 32.0 mmol/L 20.6 Low    Urea Nitrogen (BUN) 7 - 25 mg/dL 13   Creatinine 0.6 - 1.1 mg/dL 1.0   Glomerular Filtration Rate (eGFR), MDRD Estimate >60 mL/min/1.73sq m 58 Low    Calcium 8.7 - 10.3 mg/dL 9.0   AST  8 - 39 U/L 15   ALT  5 - 38 U/L 16   Alk Phos (alkaline Phosphatase) 34 - 104 U/L 107 High    Albumin 3.5 - 4.8 g/dL 4.0   Bilirubin, Total 0.3 - 1.2 mg/dL 0.7   Protein, Total 6.1 - 7.9 g/dL 6.0 Low    A/G Ratio 1.0 - 5.0 gm/dL 2.0   Resulting Agency  [facility] - [facility]  Specimen Collected: 03/22/21 09:10 Last Resulted: 03/22/21 15:23  Received From: [facility]  Result Received: 03/23/21 13:38   Urinalysis Component     Latest Ref Rng & Units 04/28/2021  Specific Gravity, UA     1.005 - 1.030 >1.030 (H)  pH, UA     5.0 - 7.5 5.5  Color, UA     Yellow Orange  Appearance Ur     Clear Cloudy (A)  Leukocytes,UA     Negative 1+ (A)  Protein,UA     Negative/Trace 1+ (A)  Glucose, UA     Negative Trace (A)  Ketones, UA     Negative Trace (A)  RBC, UA     Negative 1+ (A)  Bilirubin, UA     Negative Negative  Urobilinogen, Ur     0.2 - 1.0 mg/dL 0.2  Nitrite, UA     Negative Negative  Microscopic Examination      See below:   Component     Latest Ref Rng & Units 04/28/2021  WBC, UA     0 - 5 /  hpf 6-10 (A)  RBC     0 - 2 /hpf 3-10 (A)  Epithelial Cells (non renal)     0 - 10 /hpf >10 (A)  Crystals     N/A Present (A)  Crystal Type     N/A Calcium Oxalate  Bacteria, UA     None seen/Few Moderate (A)  I have reviewed the labs.  Pertinent Imaging No recent imaging   Assessment & Plan:    1. Incontinence -Resolved      2. Recurrent UTI -UA moderate bacteria and micro heme -urine culture pending -started on Augmentin until culture and sensitivities  return and will adjust if neccessary  3. Bilateral nephrolithiasis -calcium oxalate crystals found in UA  -if micro heme persist despite treatment with antibiotics or urine culture is negative, we obtain a KUB  4. Possible small proteinaceous/hemorrhagic cyst.   Renal mass protocol MRI scheduled for 10/2021  [facility] [address] [place], [place] [address] [phone]   Zara Council, PA-C

## 2021-06-01 ENCOUNTER — Ambulatory Visit: Payer: BC Managed Care – PPO | Admitting: Urology

## 2021-06-21 ENCOUNTER — Other Ambulatory Visit: Payer: Self-pay | Admitting: Urology

## 2021-08-10 ENCOUNTER — Other Ambulatory Visit: Payer: Self-pay | Admitting: Physician Assistant

## 2021-08-17 ENCOUNTER — Other Ambulatory Visit: Payer: Self-pay | Admitting: Physician Assistant

## 2021-08-17 ENCOUNTER — Telehealth: Payer: Self-pay | Admitting: *Deleted

## 2021-08-17 NOTE — Telephone Encounter (Signed)
Patient called triage line requesting refill on antibiotic-ran out of medication last week and started developing frequency and pressure. She noticed this morning she was having some on going flank pain. Missed follow up in June. Scheduled patient for follow up tomorrow-she is aware to get KUB done prior. Aware if symptoms worsen to seek medical attention voiced understanding.

## 2021-08-18 ENCOUNTER — Other Ambulatory Visit: Payer: Self-pay

## 2021-08-18 ENCOUNTER — Ambulatory Visit
Admission: RE | Admit: 2021-08-18 | Discharge: 2021-08-18 | Disposition: A | Discharge status: Home / Self Care | Payer: Federal, State, Local not specified - PPO | Source: Ambulatory Visit | Attending: Urology | Admitting: Urology

## 2021-08-18 ENCOUNTER — Ambulatory Visit (INDEPENDENT_AMBULATORY_CARE_PROVIDER_SITE_OTHER): Payer: BC Managed Care – PPO | Admitting: Physician Assistant

## 2021-08-18 ENCOUNTER — Encounter: Payer: Self-pay | Admitting: Physician Assistant

## 2021-08-18 VITALS — BP 109/79 | HR 70 | Ht 65.0 in | Wt 163.0 lb

## 2021-08-18 DIAGNOSIS — R109 Unspecified abdominal pain: Secondary | ICD-10-CM

## 2021-08-18 DIAGNOSIS — Z87442 Personal history of urinary calculi: Secondary | ICD-10-CM | POA: Diagnosis not present

## 2021-08-18 DIAGNOSIS — N200 Calculus of kidney: Secondary | ICD-10-CM

## 2021-08-18 MED ORDER — LEVOFLOXACIN 500 MG PO TABS: 500.0000 mg | ORAL_TABLET | Freq: Every day | ORAL | 0 refills | Status: DC

## 2021-08-18 NOTE — Progress Notes (Signed)
08/18/2021 10:05 AM   Natasha Estrada 1971/07/16 [id number]  CC: Chief Complaint  Patient presents with   Nephrolithiasis   HPI: Natasha Estrada is a 50 y.o. female with PMH possible small right renal hemorrhagic cyst, nephrolithiasis, and recurrent UTI typically on daily suppressive trimethoprim who presents today for evaluation of possible acute stone episode.   Today she reports having run out of trimethoprim approximately 1 week ago.  Shortly thereafter, she developed bilateral, R>L flank pain associated with chills, frequency, and urgency.  She denies fever, nausea, or vomiting.  She took Azo, most recently last night.  She thinks her current symptoms are more consistent with urinary tract infection than acute stone episode. Notably, she saw Zara Council with similar symptoms around 3 months ago and was encouraged to obtain KUB, however this was never performed.  She underwent KUB today with no evidence of acute stone episode.  There is evidence of interval L2 kyphoplasty compared to prior imaging.  In-office UA today positive for red color, unable to read due to pigment interference; urine microscopy with 6-10 WBCs/HPF, >30 RBCs/HPF, and moderate bacteria.  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 on daily antibix treatment    Surgical History: Past Surgical History:  Procedure Laterality Date   BLADDER STONE REMOVAL  2018   CERVICAL  CONE BIOPSY  2018   CHOLECYSTECTOMY     GASTRIC ROUX-EN-Y  2013   KYPHOPLASTY N/A 12/07/2020   Procedure: L2 Kyphoplasty;  Surgeon: Hessie Knows, MD;  Location: [facility] ORS;  Service: Orthopedics;  Laterality: N/A;   ROTATOR CUFF REPAIR Left 2010   SHOULDER ARTHROSCOPY WITH SUBACROMIAL DECOMPRESSION AND OPEN ROTATOR C Left 10/19/2020   Procedure: LEFT SHOULDER ARTHROSCOPY WITH DEBRIDEMENT DECOMPRESSION AND POSSIBLE BICEP TENODESIS;  Surgeon: Corky Mull, MD;  Location: [facility] ORS;  Service: Orthopedics;  Laterality: Left;   transforaminal epidural steroid injection Left 10/01/2020   C6-7    Home Medications:  Allergies as of 08/18/2021       Reactions   Cefuroxime Shortness Of Breath   Tolerates Augmentin per husband Joe   Sulfa Antibiotics Rash        Medication List        Accurate as of August 18, 2021 10:05 AM. If you have any questions, ask your nurse or doctor.          amoxicillin-clavulanate 875-125 MG tablet Commonly known as: AUGMENTIN Take 1 tablet by mouth every 12 (twelve) hours.   buPROPion 150 MG 24 hr tablet Commonly known as: WELLBUTRIN XL Take 150 mg by mouth daily.   busPIRone 7.5 MG tablet Commonly known as: BUSPAR Take 7.5 mg by mouth 2 (two) times daily.   cholecalciferol 25 MCG (1000 UNIT) tablet Commonly known as: VITAMIN D3 Take 1,000 Units by mouth daily.  cyclobenzaprine 10 MG tablet Commonly known as: FLEXERIL Take 10 mg by mouth every evening.   DULoxetine 60 MG capsule Commonly known as: CYMBALTA Take 60 mg by mouth daily.   gabapentin 300 MG capsule Commonly known as: NEURONTIN Take 600 mg by mouth at bedtime.   gabapentin 100 MG capsule Commonly known as: NEURONTIN Take 100 mg by mouth daily as needed (pain).   ibuprofen 800 MG tablet Commonly known as: ADVIL Take 800 mg by mouth every 6 (six) hours as needed for moderate pain.   levonorgestrel 20 MCG/24HR IUD Commonly known as: MIRENA by Intrauterine route.    linaclotide 145 MCG Caps capsule Commonly known as: LINZESS Take 145 mcg by mouth daily before breakfast.   losartan 50 MG tablet Commonly known as: COZAAR Take 1 tablet by mouth daily.   omeprazole 20 MG capsule Commonly known as: PRILOSEC Take 20 mg by mouth daily.   oxyCODONE 5 MG immediate release tablet Commonly known as: Roxicodone Take 1 tablet (5 mg total) by mouth every 4 (four) hours as needed for moderate pain or severe pain.   propranolol 40 MG tablet Commonly known as: INDERAL Take 40 mg by mouth at bedtime.   pseudoephedrine 120 MG 12 hr tablet Commonly known as: SUDAFED Take 120 mg by mouth daily as needed for congestion.   trimethoprim 100 MG tablet Commonly known as: TRIMPEX TAKE 1 TABLET(100 MG) BY MOUTH DAILY   vitamin B-12 1000 MCG tablet Commonly known as: CYANOCOBALAMIN Take 1,000 mcg by mouth daily.   zolpidem 10 MG tablet Commonly known as: AMBIEN Take 10 mg by mouth at bedtime.        Allergies:  Allergies  Allergen Reactions   Cefuroxime Shortness Of Breath    Tolerates Augmentin per husband Joe   Sulfa Antibiotics Rash    Family History: No family history on file.  Social History:   reports that she has never smoked. She has never used smokeless tobacco. She reports that she does not currently use alcohol. She reports that she does not use drugs.  Physical Exam: BP 109/79   Pulse 70   Ht '5\' 5"'$  (1.651 m)   Wt 163 lb (73.9 kg)   BMI 27.12 kg/m   Constitutional:  Alert and oriented, no acute distress, nontoxic appearing HEENT: [place], AT Cardiovascular: No clubbing, cyanosis, or edema Respiratory: Normal respiratory effort, no increased work of breathing GU: No CVA tenderness Skin: No rashes, bruises or suspicious lesions Neurologic: Grossly intact, no focal deficits, moving all 4 extremities Psychiatric: Normal mood and affect  Laboratory Data: Results for orders placed or performed in visit on 08/18/21  Microscopic  Examination   Urine  Result Value Ref Range   WBC, UA 6-10 (A) 0 - 5 /hpf   RBC >30 (A) 0 - 2 /hpf   Epithelial Cells (non renal) 0-10 0 - 10 /hpf   Casts Present (A) None seen /lpf   Cast Type Hyaline casts N/A   Bacteria, UA Moderate (A) None seen/Few  Urinalysis, Complete  Result Value Ref Range   Specific Gravity, UA 1.025 1.005 - 1.030   pH, UA 5.0 5.0 - 7.5   Color, UA Red (A) Yellow   Appearance Ur Cloudy (A) Clear   Protein,UA CANCELED    Glucose, UA CANCELED    Ketones, UA CANCELED    RBC, UA 2+ (A) Negative   Bilirubin, UA Negative Negative   Microscopic Examination See below:    Pertinent Imaging: KUB, 08/18/2021: CLINICAL DATA:  Nephrolithiasis   EXAM: ABDOMEN - 1 VIEW   COMPARISON:  KUB 05/05/2020, CT abdomen/pelvis 10/01/2020   FINDINGS: There is a nonobstructive bowel gas pattern.   There is a 5 mm calcification projecting over the left renal shadow which appears increased in size compared to the radiographs from 2021. Numerous punctate calcifications projecting over the right renal shadow are unchanged, consistent with capsular calcifications as seen on prior CT. No definite new right stones are seen.   Cholecystectomy clips, left upper quadrant surgical clips, and left lower quadrant chain sutures are noted. Post kyphoplasty changes at L2 are new since the prior radiograph. An IUD is in place projecting over the pelvis.   IMPRESSION: 5 mm left renal stone appears increased in size since 2021.     Electronically Signed   By: Valetta Mole M.D.   On: 08/19/2021 10:43  I personally reviewed the images referenced above and note no radiopaque ureteral stones.  Assessment & Plan:   1. Flank pain with history of urolithiasis UA today notable for hematuria > pyuria and bacteriuria. Differential includes UTI (acute cystitis versus early pyelonephritis) versus acute stone episode. Vitals stable in clinic today and patient is well appearing. I offered the  patient STAT CT stone study today for further evaluation, however based on scheduling she elected to defer this until tomorrow afternoon. I will start her on empiric antibiotics in the meantime and counseled her to proceed to the ED in the interim with worsening flank pain, fever, chills, nausea, or vomiting. She expressed understanding. - Urinalysis, Complete - CULTURE, URINE COMPREHENSIVE - CT RENAL STONE STUDY; Future - levofloxacin (LEVAQUIN) 500 MG tablet; Take 1 tablet (500 mg total) by mouth daily.  Dispense: 7 tablet; Refill: 0   Return for Will call with results.  Debroah Loop, PA-C  [facility] [address], [place], [place] [address] [phone]

## 2021-08-19 ENCOUNTER — Other Ambulatory Visit: Payer: Self-pay

## 2021-08-19 ENCOUNTER — Ambulatory Visit
Admission: RE | Admit: 2021-08-19 | Discharge: 2021-08-19 | Disposition: A | Discharge status: Home / Self Care | Payer: Federal, State, Local not specified - PPO | Source: Ambulatory Visit | Attending: Physician Assistant | Admitting: Physician Assistant

## 2021-08-19 DIAGNOSIS — R109 Unspecified abdominal pain: Secondary | ICD-10-CM | POA: Diagnosis present

## 2021-08-19 DIAGNOSIS — Z87442 Personal history of urinary calculi: Secondary | ICD-10-CM | POA: Insufficient documentation

## 2021-08-19 LAB — URINALYSIS, COMPLETE
Bilirubin, UA: NEGATIVE
Specific Gravity, UA: 1.025 (ref 1.005–1.030)
pH, UA: 5 (ref 5.0–7.5)

## 2021-08-19 LAB — MICROSCOPIC EXAMINATION: RBC, Urine: >30 /hpf — AB (ref 0–2)

## 2021-08-22 ENCOUNTER — Telehealth: Payer: Self-pay

## 2021-08-22 DIAGNOSIS — N390 Urinary tract infection, site not specified: Secondary | ICD-10-CM

## 2021-08-22 MED ORDER — TRIMETHOPRIM 100 MG PO TABS: ORAL_TABLET | ORAL | 5 refills | Status: DC

## 2021-08-22 NOTE — Telephone Encounter (Signed)
Trimethoprim rx sent to her pharmacy, she can restart it the day after she completes her treatment course.

## 2021-08-22 NOTE — Telephone Encounter (Signed)
Notified patient as advised. Patient voiced understanding.

## 2021-08-22 NOTE — Addendum Note (Signed)
Addended by: Mickle Plumb on: 08/22/2021 04:03 PM   Modules accepted: Orders

## 2021-08-22 NOTE — Telephone Encounter (Signed)
-----   Message from Debroah Loop, [place] sent at 08/22/2021 10:53 AM EDT ----- No obstructing stones, continue antibiotics. ----- Message ----- From: Interface, Rad Results In Sent: 08/19/2021   4:11 PM EDT To: Debroah Loop, PA-C

## 2021-08-22 NOTE — Telephone Encounter (Signed)
Notified patient of results and advised patient to continue and finish ABX. Patient would like to know if it will be possible for her to go back on trimethoprim. She goes on to say that she ran out a few weeks ago and feels this is why she feels infected.

## 2021-08-24 ENCOUNTER — Telehealth: Payer: Self-pay

## 2021-08-24 LAB — CULTURE, URINE COMPREHENSIVE

## 2021-08-24 NOTE — Telephone Encounter (Signed)
Notified patient as advised of negative culture. Appt made with Dr Bernardo Heater for cysto. Patient confirmed appt and verbalized understanding.

## 2021-08-24 NOTE — Telephone Encounter (Signed)
-----   Message from Debroah Loop, [place] sent at 08/24/2021  3:42 PM EDT ----- Urine culture has finalized negative for infection. This leaves [name] without an explanation for her recent microscopic hematuria. I'd like her to come in to see Dr. Bernardo Heater for another cystoscopy and possible urine cytology to rule out any bladder abnormalities that may have caused this. ----- Message ----- From: Interface, [facility] Lab Results In Sent: 08/19/2021   5:37 AM EDT To: Debroah Loop, PA-C

## 2021-09-09 ENCOUNTER — Encounter: Payer: Self-pay | Admitting: Urology

## 2021-09-09 ENCOUNTER — Other Ambulatory Visit: Payer: Federal, State, Local not specified - PPO | Admitting: Urology

## 2021-10-15 ENCOUNTER — Inpatient Hospital Stay
Admission: EM | Admit: 2021-10-15 | Discharge: 2021-10-17 | DRG: 872 | Disposition: A | Discharge status: Home / Self Care | Payer: Federal, State, Local not specified - PPO | Attending: Student in an Organized Health Care Education/Training Program | Admitting: Student in an Organized Health Care Education/Training Program

## 2021-10-15 ENCOUNTER — Encounter: Payer: Self-pay | Admitting: Emergency Medicine

## 2021-10-15 ENCOUNTER — Emergency Department: Payer: Federal, State, Local not specified - PPO

## 2021-10-15 DIAGNOSIS — Z792 Long term (current) use of antibiotics: Secondary | ICD-10-CM

## 2021-10-15 DIAGNOSIS — E860 Dehydration: Secondary | ICD-10-CM | POA: Diagnosis present

## 2021-10-15 DIAGNOSIS — Z882 Allergy status to sulfonamides status: Secondary | ICD-10-CM

## 2021-10-15 DIAGNOSIS — N200 Calculus of kidney: Secondary | ICD-10-CM | POA: Diagnosis present

## 2021-10-15 DIAGNOSIS — Z9884 Bariatric surgery status: Secondary | ICD-10-CM | POA: Diagnosis not present

## 2021-10-15 DIAGNOSIS — K219 Gastro-esophageal reflux disease without esophagitis: Secondary | ICD-10-CM | POA: Diagnosis present

## 2021-10-15 DIAGNOSIS — F32A Depression, unspecified: Secondary | ICD-10-CM | POA: Diagnosis present

## 2021-10-15 DIAGNOSIS — Z881 Allergy status to other antibiotic agents status: Secondary | ICD-10-CM

## 2021-10-15 DIAGNOSIS — R55 Syncope and collapse: Secondary | ICD-10-CM | POA: Diagnosis present

## 2021-10-15 DIAGNOSIS — I10 Essential (primary) hypertension: Secondary | ICD-10-CM | POA: Diagnosis present

## 2021-10-15 DIAGNOSIS — A4151 Sepsis due to Escherichia coli [E. coli]: Secondary | ICD-10-CM | POA: Diagnosis not present

## 2021-10-15 DIAGNOSIS — Z8659 Personal history of other mental and behavioral disorders: Secondary | ICD-10-CM

## 2021-10-15 DIAGNOSIS — N390 Urinary tract infection, site not specified: Secondary | ICD-10-CM

## 2021-10-15 DIAGNOSIS — N10 Acute tubulo-interstitial nephritis: Secondary | ICD-10-CM | POA: Diagnosis present

## 2021-10-15 DIAGNOSIS — G894 Chronic pain syndrome: Secondary | ICD-10-CM | POA: Diagnosis present

## 2021-10-15 DIAGNOSIS — Z79899 Other long term (current) drug therapy: Secondary | ICD-10-CM

## 2021-10-15 DIAGNOSIS — Z20822 Contact with and (suspected) exposure to covid-19: Secondary | ICD-10-CM | POA: Diagnosis present

## 2021-10-15 DIAGNOSIS — A419 Sepsis, unspecified organism: Secondary | ICD-10-CM

## 2021-10-15 DIAGNOSIS — K581 Irritable bowel syndrome with constipation: Secondary | ICD-10-CM | POA: Diagnosis present

## 2021-10-15 DIAGNOSIS — N179 Acute kidney failure, unspecified: Secondary | ICD-10-CM | POA: Diagnosis present

## 2021-10-15 DIAGNOSIS — Z8249 Family history of ischemic heart disease and other diseases of the circulatory system: Secondary | ICD-10-CM | POA: Diagnosis not present

## 2021-10-15 DIAGNOSIS — F419 Anxiety disorder, unspecified: Secondary | ICD-10-CM | POA: Diagnosis present

## 2021-10-15 DIAGNOSIS — Z880 Allergy status to penicillin: Secondary | ICD-10-CM

## 2021-10-15 DIAGNOSIS — M1990 Unspecified osteoarthritis, unspecified site: Secondary | ICD-10-CM | POA: Diagnosis present

## 2021-10-15 LAB — RESP PANEL BY RT-PCR (FLU A&B, COVID) ARPGX2
Influenza A by PCR: NEGATIVE
Influenza B by PCR: NEGATIVE
SARS Coronavirus 2 by RT PCR: NEGATIVE

## 2021-10-15 LAB — CBC WITH DIFFERENTIAL/PLATELET
Abs Immature Granulocytes: 0.19 10*3/uL — ABNORMAL HIGH (ref 0.00–0.07)
Basophils Absolute: 0 10*3/uL (ref 0.0–0.1)
Basophils Relative: 0 %
Eosinophils Absolute: 0.1 10*3/uL (ref 0.0–0.5)
Eosinophils Relative: 0 %
HCT: 34.5 % — ABNORMAL LOW (ref 36.0–46.0)
Hemoglobin: 11.3 g/dL — ABNORMAL LOW (ref 12.0–15.0)
Immature Granulocytes: 1 %
Lymphocytes Relative: 3 %
Lymphs Abs: 0.5 10*3/uL — ABNORMAL LOW (ref 0.7–4.0)
MCH: 28.8 pg (ref 26.0–34.0)
MCHC: 32.8 g/dL (ref 30.0–36.0)
MCV: 87.8 fL (ref 80.0–100.0)
Monocytes Absolute: 2.3 10*3/uL — ABNORMAL HIGH (ref 0.1–1.0)
Monocytes Relative: 12 %
Neutro Abs: 15.9 10*3/uL — ABNORMAL HIGH (ref 1.7–7.7)
Neutrophils Relative %: 84 %
Platelets: 178 10*3/uL (ref 150–400)
RBC: 3.93 MIL/uL (ref 3.87–5.11)
RDW: 13.6 % (ref 11.5–15.5)
WBC: 19 10*3/uL — ABNORMAL HIGH (ref 4.0–10.5)
nRBC: 0 % (ref 0.0–0.2)

## 2021-10-15 LAB — COMPREHENSIVE METABOLIC PANEL
ALT: 23 U/L (ref 0–44)
AST: 25 U/L (ref 15–41)
Albumin: 3.3 g/dL — ABNORMAL LOW (ref 3.5–5.0)
Alkaline Phosphatase: 81 U/L (ref 38–126)
Anion gap: 6 (ref 5–15)
BUN: 15 mg/dL (ref 6–20)
CO2: 24 mmol/L (ref 22–32)
Calcium: 8 mg/dL — ABNORMAL LOW (ref 8.9–10.3)
Chloride: 105 mmol/L (ref 98–111)
Creatinine, Ser: 1.36 mg/dL — ABNORMAL HIGH (ref 0.44–1.00)
GFR, Estimated: 47 mL/min — ABNORMAL LOW (ref >60)
Glucose, Bld: 127 mg/dL — ABNORMAL HIGH (ref 70–99)
Potassium: 3.6 mmol/L (ref 3.5–5.1)
Sodium: 135 mmol/L (ref 135–145)
Total Bilirubin: 1.9 mg/dL — ABNORMAL HIGH (ref 0.3–1.2)
Total Protein: 6.4 g/dL — ABNORMAL LOW (ref 6.5–8.1)

## 2021-10-15 LAB — URINALYSIS, ROUTINE W REFLEX MICROSCOPIC
Bilirubin Urine: NEGATIVE
Glucose, UA: NEGATIVE mg/dL
Ketones, ur: NEGATIVE mg/dL
Nitrite: NEGATIVE
Protein, ur: 100 mg/dL — AB
Specific Gravity, Urine: 1.011 (ref 1.005–1.030)
WBC, UA: >50 WBC/hpf — ABNORMAL HIGH (ref 0–5)
pH: 5 (ref 5.0–8.0)

## 2021-10-15 LAB — TROPONIN I (HIGH SENSITIVITY): Troponin I (High Sensitivity): 4 ng/L (ref <18)

## 2021-10-15 LAB — LACTIC ACID, PLASMA: Lactic Acid, Venous: 1 mmol/L (ref 0.5–1.9)

## 2021-10-15 MED ORDER — MORPHINE SULFATE (PF) 4 MG/ML IV SOLN
4.0000 mg | Freq: Once | INTRAVENOUS | Status: AC
  Administered 2021-10-15: 4 mg via INTRAVENOUS
  Filled 2021-10-15: qty 1

## 2021-10-15 MED ORDER — GABAPENTIN 300 MG PO CAPS
600.0000 mg | ORAL_CAPSULE | Freq: Every day | ORAL | Status: DC
  Administered 2021-10-15 – 2021-10-16 (×2): 600 mg via ORAL
  Filled 2021-10-15 (×2): qty 2

## 2021-10-15 MED ORDER — PIPERACILLIN-TAZOBACTAM 3.375 G IVPB 30 MIN
3.3750 g | Freq: Once | INTRAVENOUS | Status: AC
  Administered 2021-10-15: 3.375 g via INTRAVENOUS
  Filled 2021-10-15: qty 50

## 2021-10-15 MED ORDER — SODIUM CHLORIDE 0.9 % IV BOLUS: 1000.0000 mL | Freq: Once | INTRAVENOUS | Status: DC

## 2021-10-15 MED ORDER — BISACODYL 5 MG PO TBEC
5.0000 mg | DELAYED_RELEASE_TABLET | Freq: Once | ORAL | Status: AC
  Administered 2021-10-15: 5 mg via ORAL
  Filled 2021-10-15: qty 1

## 2021-10-15 MED ORDER — ACETAMINOPHEN 325 MG PO TABS
650.0000 mg | ORAL_TABLET | Freq: Four times a day (QID) | ORAL | Status: DC | PRN
  Administered 2021-10-15 – 2021-10-17 (×4): 650 mg via ORAL
  Filled 2021-10-15 (×4): qty 2

## 2021-10-15 MED ORDER — LACTATED RINGERS IV BOLUS (SEPSIS)
1000.0000 mL | Freq: Once | INTRAVENOUS | Status: AC
  Administered 2021-10-15: 1000 mL via INTRAVENOUS

## 2021-10-15 MED ORDER — BISACODYL 10 MG RE SUPP
10.0000 mg | Freq: Once | RECTAL | Status: DC
  Filled 2021-10-15: qty 1

## 2021-10-15 MED ORDER — ONDANSETRON HCL 4 MG/2ML IJ SOLN
4.0000 mg | Freq: Four times a day (QID) | INTRAMUSCULAR | Status: DC | PRN
  Administered 2021-10-15 – 2021-10-16 (×2): 4 mg via INTRAVENOUS
  Filled 2021-10-15 (×2): qty 2

## 2021-10-15 MED ORDER — LEVOFLOXACIN IN D5W 750 MG/150ML IV SOLN: 750.0000 mg | INTRAVENOUS | Status: DC

## 2021-10-15 MED ORDER — ACETAMINOPHEN 650 MG RE SUPP
650.0000 mg | Freq: Four times a day (QID) | RECTAL | Status: DC | PRN
  Filled 2021-10-15: qty 1

## 2021-10-15 MED ORDER — VITAMIN D 25 MCG (1000 UNIT) PO TABS: 1000.0000 [IU] | ORAL_TABLET | Freq: Every day | ORAL | Status: DC

## 2021-10-15 MED ORDER — LACTATED RINGERS IV SOLN: INTRAVENOUS | Status: DC

## 2021-10-15 MED ORDER — BUPROPION HCL ER (XL) 150 MG PO TB24
150.0000 mg | ORAL_TABLET | Freq: Every day | ORAL | Status: DC
  Administered 2021-10-15 – 2021-10-17 (×3): 150 mg via ORAL
  Filled 2021-10-15 (×3): qty 1

## 2021-10-15 MED ORDER — LEVOFLOXACIN IN D5W 750 MG/150ML IV SOLN
750.0000 mg | Freq: Once | INTRAVENOUS | Status: AC
  Administered 2021-10-15: 750 mg via INTRAVENOUS
  Filled 2021-10-15: qty 150

## 2021-10-15 MED ORDER — ZOLPIDEM TARTRATE 5 MG PO TABS
5.0000 mg | ORAL_TABLET | Freq: Every day | ORAL | Status: DC
  Administered 2021-10-15 – 2021-10-16 (×2): 5 mg via ORAL
  Filled 2021-10-15 (×2): qty 1

## 2021-10-15 MED ORDER — ONDANSETRON HCL 4 MG PO TABS: 4.0000 mg | ORAL_TABLET | Freq: Four times a day (QID) | ORAL | Status: DC | PRN

## 2021-10-15 MED ORDER — ENOXAPARIN SODIUM 40 MG/0.4ML IJ SOSY
40.0000 mg | PREFILLED_SYRINGE | INTRAMUSCULAR | Status: DC
  Administered 2021-10-15 – 2021-10-16 (×2): 40 mg via SUBCUTANEOUS
  Filled 2021-10-15 (×2): qty 0.4

## 2021-10-15 MED ORDER — LACTATED RINGERS IV BOLUS (SEPSIS)
250.0000 mL | Freq: Once | INTRAVENOUS | Status: AC
  Administered 2021-10-15: 250 mL via INTRAVENOUS

## 2021-10-15 MED ORDER — PANTOPRAZOLE SODIUM 40 MG PO TBEC
40.0000 mg | DELAYED_RELEASE_TABLET | Freq: Every day | ORAL | Status: DC
  Administered 2021-10-16 – 2021-10-17 (×2): 40 mg via ORAL
  Filled 2021-10-15 (×2): qty 1

## 2021-10-15 MED ORDER — VITAMIN B-12 1000 MCG PO TABS: 1000.0000 ug | ORAL_TABLET | Freq: Every day | ORAL | Status: DC

## 2021-10-15 MED ORDER — BUSPIRONE HCL 15 MG PO TABS
15.0000 mg | ORAL_TABLET | Freq: Two times a day (BID) | ORAL | Status: DC
  Administered 2021-10-15 – 2021-10-17 (×4): 15 mg via ORAL
  Filled 2021-10-15 (×5): qty 1

## 2021-10-15 MED ORDER — CYCLOBENZAPRINE HCL 10 MG PO TABS
10.0000 mg | ORAL_TABLET | Freq: Every evening | ORAL | Status: DC
  Administered 2021-10-17: 10 mg via ORAL
  Filled 2021-10-15: qty 1

## 2021-10-15 MED ORDER — DULOXETINE HCL 30 MG PO CPEP
60.0000 mg | ORAL_CAPSULE | Freq: Every day | ORAL | Status: DC
  Administered 2021-10-15 – 2021-10-17 (×3): 60 mg via ORAL
  Filled 2021-10-15 (×3): qty 2

## 2021-10-15 NOTE — H&P (Signed)
History and Physical    Natasha Estrada [id number] DOB: 12-19-1970 DOA: 10/15/2021  PCP: Wayland Denis, PA-C   Patient coming from: Home  I have personally briefly reviewed patient's old medical records in [place]  Chief Complaint: Dizziness/near syncopal episode  HPI: Natasha Estrada is a 50 y.o. female with medical history significant for recurrent UTI, hypertension, GERD, nephrolithiasis, chronic pain syndrome who was sent to the emergency room from the urgent care center via EMS for evaluation after she had a near syncopal episode at the urgent care center. Patient states that she has a history of recurrent UTI for which she takes trimethoprim for prophylaxis.  Her trimethoprim was discontinued by her primary care provider because she was placed on Augmentin for an ear infection.  She states that for the last 3 to 4 days she has had lower abdominal pain associated with dysuria and frequency of urination consistent with her prior history of UTI.  She started taking her trimethoprim without any significant improvement.  1 day prior to her admission she developed fever with a T-max of 104 per patient associated with shaking chills, headache and myalgia.  She decided to go to the urgent care center for evaluation. She had a near syncopal episode at home prior to going to the urgent care center which she describes as feeling very lightheaded and dizzy but did not fall at home.  While at the urgent care center she fell but did not lose consciousness and was said to have a blood pressure of 62 mmHg systolic.  EMS was called and she received about 400 cc of IV fluids and was transported to the ER. She denies having any chest pain, no shortness of breath, no palpitations, no nausea, no vomiting, no changes in her bowel habits, no lower extremity swelling no orthopnea, no paroxysmal nocturnal dyspnea, no blurred vision, no focal deficit. Labs show sodium 135, potassium 3.6, chloride 105,  bicarb 24, glucose 127, BUN 15, creatinine 1.36 compared to a baseline 1.0, calcium 8.0, alkaline phosphatase 81, albumin 3.3, AST 25, ALT 23, total protein 6.4, lactic acid 1.0, white count 19.0, hemoglobin 11.3, hematocrit 34.5, MCV 87.8, RDW 13.6, platelet count 178 Respiratory viral panel is negative Urine analysis shows pyuria Chest x-ray reviewed by me shows no evidence of acute cardiopulmonary disease Twelve-lead EKG reviewed by me shows normal sinus rhythm with low voltage QRS.  ED Course: Patient is a 50 year old female with a history of recurrent urinary tract infection who presents to the ER from the urgent care center where she had a near syncopal episode.  She was found to be hypotensive and responded to IV fluid resuscitation.  She was also noted to be tachypneic in the ER with a T-max of 101F. Labs show pyuria with a white count of 19,000. Patient received sepsis IV fluids in the ER, will be admitted to the hospital for further evaluation.   Review of Systems: As per HPI otherwise all other systems reviewed and negative.    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 on daily antibix treatment    Past Surgical History:  Procedure Laterality Date   BLADDER STONE REMOVAL  2018   CERVICAL CONE BIOPSY  2018   CHOLECYSTECTOMY     GASTRIC ROUX-EN-Y  2013   KYPHOPLASTY N/A 12/07/2020   Procedure: L2 Kyphoplasty;  Surgeon: Hessie Knows, MD;  Location:  [facility] ORS;  Service: Orthopedics;  Laterality: N/A;   ROTATOR CUFF REPAIR Left 2010   SHOULDER ARTHROSCOPY WITH SUBACROMIAL DECOMPRESSION AND OPEN ROTATOR C Left 10/19/2020   Procedure: LEFT SHOULDER ARTHROSCOPY WITH DEBRIDEMENT DECOMPRESSION AND POSSIBLE BICEP TENODESIS;  Surgeon: Corky Mull, MD;  Location: [facility] ORS;  Service: Orthopedics;  Laterality: Left;   transforaminal epidural steroid injection Left 10/01/2020   C6-7     reports that she has never smoked. She has never used smokeless tobacco. She reports that she does not currently use alcohol. She reports that she does not use drugs.  Allergies  Allergen Reactions   Cefuroxime Shortness Of Breath    Tolerates Augmentin per husband Joe   Sulfa Antibiotics Rash    Family History  Problem Relation Age of Onset   Hypertension Mother       Prior to Admission medications   Medication Sig Start Date End Date Taking? Authorizing Provider  buPROPion (WELLBUTRIN XL) 150 MG 24 hr tablet Take 150 mg by mouth daily.  08/27/19  Yes [provider]  busPIRone (BUSPAR) 15 MG tablet Take 15 mg by mouth 2 (two) times daily. 10/13/21  Yes [provider]  DULoxetine (CYMBALTA) 60 MG capsule Take 60 mg by mouth daily. 05/04/20  Yes [provider]  losartan (COZAAR) 50 MG tablet Take 1 tablet by mouth daily. 04/20/21  Yes [provider]  omeprazole (PRILOSEC) 20 MG capsule Take 20 mg by mouth daily.   Yes [provider]  propranolol (INDERAL) 40 MG tablet Take 40 mg by mouth at bedtime. 05/26/19  Yes [provider]  SAXENDA 18 MG/3ML SOPN Inject 3 mg into the skin daily. 09/08/21  Yes [provider]  trimethoprim (TRIMPEX) 100 MG tablet TAKE 1 TABLET(100 MG) BY MOUTH DAILY 08/22/21  Yes Vaillancourt, Samantha, PA-C  zolpidem (AMBIEN) 10 MG tablet Take 10 mg by mouth at bedtime.   Yes [provider]  cholecalciferol (VITAMIN D3) 25 MCG (1000 UNIT) tablet Take 1,000 Units by  mouth daily. Patient not taking: Reported on 10/15/2021    [provider]  fluticasone (FLONASE) 50 MCG/ACT nasal spray Place 1 spray into both nostrils 2 (two) times daily. 10/09/21   [provider]  ibuprofen (ADVIL) 800 MG tablet Take 800 mg by mouth every 6 (six) hours as needed for moderate pain. Patient not taking: Reported on 10/15/2021 11/22/20   [provider]  levofloxacin (LEVAQUIN) 500 MG tablet Take 1 tablet (500 mg total) by mouth daily. Patient not taking: No sig reported 08/18/21   Debroah Loop, PA-C  levonorgestrel (MIRENA) 20 MCG/24HR IUD by Intrauterine route.    [provider]  linaclotide Rolan Lipa) 145 MCG CAPS capsule Take 145 mcg by mouth daily before breakfast.  Patient not taking: Reported on 08/18/2021 12/23/18   [provider]  oxyCODONE (ROXICODONE) 5 MG immediate release tablet Take 1 tablet (5 mg total) by mouth  every 4 (four) hours as needed for moderate pain or severe pain. Patient not taking: No sig reported 12/07/20   Hessie Knows, MD  pseudoephedrine (SUDAFED) 120 MG 12 hr tablet Take 120 mg by mouth daily as needed for congestion.    [provider]  vitamin B-12 (CYANOCOBALAMIN) 1000 MCG tablet Take 1,000 mcg by mouth daily. Patient not taking: Reported on 10/15/2021    [provider]    Physical Exam: Vitals:   10/15/21 1429 10/15/21 1430 10/15/21 1500 10/15/21 1601  BP: 115/79 110/76 115/74 122/81  Pulse: 87 88 92 90  Resp:  18 (!) 29 (!) 25  Temp:    (!) 101.1 F (38.4 C)  TempSrc:    Oral  SpO2: 97% 100% 96% 95%  Weight:      Height:         Vitals:   10/15/21 1429 10/15/21 1430 10/15/21 1500 10/15/21 1601  BP: 115/79 110/76 115/74 122/81  Pulse: 87 88 92 90  Resp:  18 (!) 29 (!) 25  Temp:    (!) 101.1 F (38.4 C)  TempSrc:    Oral  SpO2: 97% 100% 96% 95%  Weight:      Height:          Constitutional: Alert and oriented x 3 . Not in any apparent distress,  acutely ill-appearing HEENT:      Head: Normocephalic and atraumatic.         Eyes: PERLA, EOMI, Conjunctivae are normal. Sclera is non-icteric.       Mouth/Throat: Mucous membranes are dry       Neck: Supple with no signs of meningismus. Cardiovascular: Regular rate and rhythm. No murmurs, gallops, or rubs. 2+ symmetrical distal pulses are present . No JVD. No LE edema Respiratory: Respiratory effort normal .Lungs sounds clear bilaterally. No wheezes, crackles, or rhonchi.  Gastrointestinal: Soft, suprapubic tenderness, and non distended with positive bowel sounds.  Genitourinary: Left CVA tenderness. Musculoskeletal: Nontender with normal range of motion in all extremities. No cyanosis, or erythema of extremities. Neurologic:  Face is symmetric. Moving all extremities. No gross focal neurologic deficits . Skin: Skin is warm, dry.  No rash or ulcers Psychiatric: Mood and affect are normal    Labs on Admission: I have personally reviewed following labs and imaging studies  CBC: Recent Labs  Lab 10/15/21 1222  WBC 19.0*  NEUTROABS 15.9*  HGB 11.3*  HCT 34.5*  MCV 87.8  PLT 093   Basic Metabolic Panel: Recent Labs  Lab 10/15/21 1222  NA 135  K 3.6  CL 105  CO2 24  GLUCOSE 127*  BUN 15  CREATININE 1.36*  CALCIUM 8.0*   GFR: Estimated Creatinine Clearance: 45.8 mL/min (A) (by C-G formula based on SCr of 1.36 mg/dL (H)). Liver Function Tests: Recent Labs  Lab 10/15/21 1222  AST 25  ALT 23  ALKPHOS 81  BILITOT 1.9*  PROT 6.4*  ALBUMIN 3.3*   No results for input(s): LIPASE, AMYLASE in the last 168 hours. No results for input(s): AMMONIA in the last 168 hours. Coagulation Profile: No results for input(s): INR, PROTIME in the last 168 hours. Cardiac Enzymes: No results for input(s): CKTOTAL, CKMB, CKMBINDEX, TROPONINI in the last 168 hours. BNP (last 3 results) No results for input(s): PROBNP in the last 8760 hours. HbA1C: No results for input(s): HGBA1C in the  last 72 hours. CBG: No results for input(s): GLUCAP in the last 168 hours. Lipid Profile: No results for input(s): CHOL, HDL, LDLCALC,  TRIG, CHOLHDL, LDLDIRECT in the last 72 hours. Thyroid Function Tests: No results for input(s): TSH, T4TOTAL, FREET4, T3FREE, THYROIDAB in the last 72 hours. Anemia Panel: No results for input(s): VITAMINB12, FOLATE, FERRITIN, TIBC, IRON, RETICCTPCT in the last 72 hours. Urine analysis:    Component Value Date/Time   COLORURINE YELLOW (A) 10/15/2021 1410   APPEARANCEUR CLOUDY (A) 10/15/2021 1410   APPEARANCEUR Cloudy (A) 08/18/2021 1003   LABSPEC 1.011 10/15/2021 1410   PHURINE 5.0 10/15/2021 1410   GLUCOSEU NEGATIVE 10/15/2021 1410   HGBUR MODERATE (A) 10/15/2021 1410   BILIRUBINUR NEGATIVE 10/15/2021 1410   BILIRUBINUR Negative 08/18/2021 1003   KETONESUR NEGATIVE 10/15/2021 1410   PROTEINUR 100 (A) 10/15/2021 1410   UROBILINOGEN 0.2 10/31/2019 1323   NITRITE NEGATIVE 10/15/2021 1410   LEUKOCYTESUR LARGE (A) 10/15/2021 1410    Radiological Exams on Admission: DG Chest 2 View  Result Date: 10/15/2021 CLINICAL DATA:  Possible sepsis from urinary tract infection. EXAM: CHEST - 2 VIEW COMPARISON:  Apr 28, 2020 FINDINGS: The heart size and mediastinal contours are within normal limits. Both lungs are clear. Chronic changes of several lower right ribs are identified. The bones are otherwise unchanged. IMPRESSION: No active cardiopulmonary disease. Electronically Signed   By: Abelardo Diesel M.D.   On: 10/15/2021 13:00     Assessment/Plan Principal Problem:   Sepsis secondary to UTI [facility]) Active Problems:   Acute pyelonephritis   History of depression   Nephrolithiasis   Hypertension   Near syncope      Patient is a 50 year old female admitted to the hospital for sepsis from a urinary source.    Sepsis from a urinary source/acute pyelonephritis.(POA) As evidenced by fever with a T-max of 101.1, tachypnea, hypotension that responded to IV  fluid resuscitation, marked leukocytosis and pyuria. Patient has a history of recurrent UTI and last urine culture yielded E. coli which is pan sensitive Continue IV fluid resuscitation Place patient on Levaquin adjusted to renal function since she has allergies to beta-lactam Follow-up results of blood and urine culture     Near syncope Secondary to hypotension from sepsis Monitor patient closely Continue IV fluid resuscitation Hold all antihypertensive medications for now    Depression/anxiety Continue bupropion, buspirone and Cymbalta  DVT prophylaxis: Lovenox  Code Status: full code  Family Communication: Greater than 50% of time was spent discussing patient's condition and plan of care with her and her son at the bedside.  All questions and concerns have been addressed.  They verbalized understanding and agree with the plan. Disposition Plan: Back to previous home environment Consults called: none  Status:At the time of admission, it appears that the appropriate admission status for this patient is inpatient. This is judged to be reasonable and necessary to provide the required intensity of service to ensure the patient's safety given the presenting symptoms, physical exam findings, and initial radiographic and laboratory data in the context of their comorbid conditions. Patient requires inpatient status due to high intensity of service, high risk for further deterioration and high frequency of surveillance required.     Collier Bullock MD [facility]     10/15/2021, 4:51 PM

## 2021-10-15 NOTE — ED Triage Notes (Signed)
Pt in via EMS from UC with c/o near syncopal. 62 systolic BP upon arrival.  Pt with hx of recurrent UTI's and has been on a ABX. Pt reports hx of sepsis several times. 26'Z HR, 124 systolic after fluid bolus. #20 g to right AC. Pt was given about 435mls of fluid. FSBS 109

## 2021-10-15 NOTE — Sepsis Progress Note (Signed)
Sepsis protocol is being monitored by [facility]. 

## 2021-10-15 NOTE — ED Provider Notes (Signed)
[facility] Emergency Department Provider Note   ____________________________________________   Event Date/Time   First MD Initiated Contact with Patient 10/15/21 1332     (approximate)  I have reviewed the triage vital signs and the nursing notes.   HISTORY  Chief Complaint Near Syncope    HPI Natasha Estrada is a 50 y.o. female with past medical history of hypertension, GERD, chronic back pain, and Roux-en-Y gastric bypass who presents to the ED complaining of syncope.  Patient reports that she has been feeling increasingly lightheaded over the past 24 hours after dealing with dysuria for the past 3 to 4 days.  She states dysuria has been accompanied by discomfort in her lower abdomen feeling similar to prior urinary tract infections.  She typically takes trimethoprim for prophylaxis against urinary tract infections, but this was briefly stopped by her PCP while she was being treated with amoxicillin for an ear infection.  Symptoms seem to come on after that and she has had subjective fevers, but has not taken her temperature.  She initially presented to urgent care, but felt very lightheaded and eventually passed out while there, after which EMS was called.  She denies any associated chest pain or shortness of breath, is now feeling better after receiving IV fluids with EMS.        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   Dysmenorrhea 04/17/2014   Anxiety 04/14/2013    Past Surgical History:  Procedure Laterality Date   BLADDER STONE REMOVAL  2018   CERVICAL CONE BIOPSY  2018   CHOLECYSTECTOMY     GASTRIC ROUX-EN-Y  2013   KYPHOPLASTY N/A 12/07/2020   Procedure: L2 Kyphoplasty;  Surgeon: Hessie Knows, MD;  Location: [facility] ORS;  Service: Orthopedics;  Laterality: N/A;   ROTATOR CUFF REPAIR Left 2010   SHOULDER ARTHROSCOPY WITH SUBACROMIAL DECOMPRESSION AND OPEN ROTATOR C Left 10/19/2020   Procedure: LEFT SHOULDER ARTHROSCOPY WITH DEBRIDEMENT DECOMPRESSION AND POSSIBLE BICEP TENODESIS;  Surgeon: Corky Mull, MD;  Location: [facility] ORS;  Service: Orthopedics;  Laterality: Left;   transforaminal epidural steroid injection Left 10/01/2020   C6-7    Prior to Admission medications   Medication Sig Start Date End  Date Taking? Authorizing Provider  buPROPion (WELLBUTRIN XL) 150 MG 24 hr tablet Take 150 mg by mouth daily.  08/27/19   [provider]  busPIRone (BUSPAR) 7.5 MG tablet Take 7.5 mg by mouth 2 (two) times daily.  07/31/19   [provider]  cholecalciferol (VITAMIN D3) 25 MCG (1000 UNIT) tablet Take 1,000 Units by mouth daily.    [provider]  cyclobenzaprine (FLEXERIL) 10 MG tablet Take 10 mg by mouth every evening. 09/03/19   [provider]   DULoxetine (CYMBALTA) 60 MG capsule Take 60 mg by mouth daily. 05/04/20   [provider]  gabapentin (NEURONTIN) 300 MG capsule Take 600 mg by mouth at bedtime.  09/03/19   [provider]  ibuprofen (ADVIL) 800 MG tablet Take 800 mg by mouth every 6 (six) hours as needed for moderate pain. 11/22/20   [provider]  levofloxacin (LEVAQUIN) 500 MG tablet Take 1 tablet (500 mg total) by mouth daily. 08/18/21   Debroah Loop, PA-C  levonorgestrel (MIRENA) 20 MCG/24HR IUD by Intrauterine route.    [provider]  linaclotide Rolan Lipa) 145 MCG CAPS capsule Take 145 mcg by mouth daily before breakfast.  Patient not taking: Reported on 08/18/2021 12/23/18   [provider]  losartan (COZAAR) 50 MG tablet Take 1 tablet by mouth daily. 04/20/21   [provider]  omeprazole (PRILOSEC) 20 MG capsule Take 20 mg by mouth daily.    [provider]  oxyCODONE (ROXICODONE) 5 MG immediate release tablet Take 1 tablet (5 mg total) by mouth every 4 (four) hours as needed for moderate pain or severe pain. Patient not taking: Reported on 08/18/2021 12/07/20   Hessie Knows, MD  propranolol (INDERAL) 40 MG tablet Take 40 mg by mouth at bedtime. 05/26/19   [provider]  pseudoephedrine (SUDAFED) 120 MG 12 hr tablet Take 120 mg by mouth daily as needed for congestion.    [provider]  trimethoprim (TRIMPEX) 100 MG tablet TAKE 1 TABLET(100 MG) BY MOUTH DAILY 08/22/21   Vaillancourt, Aldona Bar, PA-C  vitamin B-12 (CYANOCOBALAMIN) 1000 MCG tablet Take 1,000 mcg by mouth daily.    [provider]  zolpidem (AMBIEN) 10 MG tablet Take 10 mg by mouth at bedtime.    [provider]    Allergies Cefuroxime and Sulfa antibiotics  No family history on file.  Social History Social History   Tobacco Use   Smoking status: Never   Smokeless tobacco: Never  Vaping Use   Vaping Use: Never used  Substance Use Topics    Alcohol use: Not Currently   Drug use: Never    Review of Systems  Constitutional: No fever/chills Eyes: No visual changes. ENT: No sore throat. Cardiovascular: Denies chest pain.  Positive for lightheadedness and syncope. Respiratory: Denies shortness of breath. Gastrointestinal: Positive for abdominal pain.  No nausea, no vomiting.  No diarrhea.  No constipation. Genitourinary: Positive for dysuria and frequency. Musculoskeletal: Negative for back pain. Skin: Negative for rash. Neurological: Negative for headaches, focal weakness or numbness.  ____________________________________________   PHYSICAL EXAM:  VITAL SIGNS: ED Triage Vitals  Enc Vitals Group     BP 10/15/21 1203 (!) 89/60     Pulse Rate 10/15/21 1203 93     Resp 10/15/21 1203 16     Temp 10/15/21 1203 99.8 F (37.7 C)     Temp Source 10/15/21 1203 Oral     SpO2 10/15/21 1203 93 %     Weight 10/15/21  1205 150 lb (68 kg)     Height 10/15/21 1205 5\' 4"  (1.626 m)     Head Circumference --      Peak Flow --      Pain Score 10/15/21 1204 4     Pain Loc --      Pain Edu? --      Excl. in [place]? --     Constitutional: Alert and oriented. Eyes: Conjunctivae are normal. Head: Atraumatic. Nose: No congestion/rhinnorhea. Mouth/Throat: Mucous membranes are moist. Neck: Normal ROM Cardiovascular: Normal rate, regular rhythm. Grossly normal heart sounds.  2+ radial pulses bilaterally. Respiratory: Normal respiratory effort.  No retractions. Lungs CTAB. Gastrointestinal: Soft and tender to palpation in the suprapubic area, no CVA tenderness noted.  No distention. Genitourinary: deferred Musculoskeletal: No lower extremity tenderness nor edema. Neurologic:  Normal speech and language. No gross focal neurologic deficits are appreciated. Skin:  Skin is warm, dry and intact. No rash noted. Psychiatric: Mood and affect are normal. Speech and behavior are normal.  ____________________________________________    LABS (all labs ordered are listed, but only abnormal results are displayed)  Labs Reviewed  URINALYSIS, ROUTINE W REFLEX MICROSCOPIC - Abnormal; Notable for the following components:      Result Value   Color, Urine YELLOW (*)    APPearance CLOUDY (*)    Hgb urine dipstick MODERATE (*)    Protein, ur 100 (*)    Leukocytes,Ua LARGE (*)    WBC, UA >50 (*)    Bacteria, UA MANY (*)    All other components within normal limits  COMPREHENSIVE METABOLIC PANEL - Abnormal; Notable for the following components:   Glucose, Bld 127 (*)    Creatinine, Ser 1.36 (*)    Calcium 8.0 (*)    Total Protein 6.4 (*)    Albumin 3.3 (*)    Total Bilirubin 1.9 (*)    GFR, Estimated 47 (*)    All other components within normal limits  CBC WITH DIFFERENTIAL/PLATELET - Abnormal; Notable for the following components:   WBC 19.0 (*)    Hemoglobin 11.3 (*)    HCT 34.5 (*)    Neutro Abs 15.9 (*)    Lymphs Abs 0.5 (*)    Monocytes Absolute 2.3 (*)    Abs Immature Granulocytes 0.19 (*)    All other components within normal limits  URINE CULTURE  CULTURE, BLOOD (ROUTINE X 2)  CULTURE, BLOOD (ROUTINE X 2)  RESP PANEL BY RT-PCR (FLU A&B, COVID) ARPGX2  LACTIC ACID, PLASMA  TROPONIN I (HIGH SENSITIVITY)   ____________________________________________  EKG  ED ECG REPORT I, Blake Divine, the attending physician, personally viewed and interpreted this ECG.   Date: 10/15/2021  EKG Time: 12:08  Rate: 92  Rhythm: normal sinus rhythm  Axis: Normal  Intervals:none  ST&T Change: None   PROCEDURES  Procedure(s) performed (including Critical Care):  .Critical Care Performed by: Blake Divine, MD Authorized by: Blake Divine, MD   Critical care provider statement:    Critical care time (minutes):  45   Critical care time was exclusive of:  Separately billable procedures and treating other patients and teaching time   Critical care was necessary to treat or prevent imminent or life-threatening  deterioration of the following conditions:  Sepsis   Critical care was time spent personally by me on the following activities:  Development of treatment plan with patient or surrogate, evaluation of patient's response to treatment, examination of patient, obtaining history from patient or surrogate, review of old charts,  re-evaluation of patient's condition, pulse oximetry, ordering and review of radiographic studies, ordering and review of laboratory studies and ordering and performing treatments and interventions   I assumed direction of critical care for this patient from another provider in my specialty: no     Care discussed with: admitting provider     ____________________________________________   INITIAL IMPRESSION / ASSESSMENT AND PLAN / ED COURSE      50 year old female with past medical history of hypertension, GERD, chronic back pain, and Roux-en-Y gastric bypass who presents to the ED with 3 to 4 days of dysuria and urinary frequency now with increasing lightheadedness and episode of syncope at urgent care.  Patient noted to initially have borderline low blood pressure although MAP may remains greater than 65.  Initial labs also remarkable for elevated WBC, concerning for sepsis and we will check blood cultures, lactic acid within normal limits.  We will continue IV fluid hydration and suspect symptoms are due to UTI.  Given she is regularly on prophylactic antibiotic, we will cover more broadly with Zosyn.  She reports allergy to cephalosporins in the past but has tolerated penicillins.  UA is consistent with infection, patient has received Zosyn without any sign of allergic reaction.  Blood pressure improved following IV fluid bolus and patient feeling better overall.  Plan discussed with hospitalist for admission for sepsis secondary to UTI.      ____________________________________________   FINAL CLINICAL IMPRESSION(S) / ED DIAGNOSES  Final diagnoses:  Sepsis without  acute organ dysfunction, due to unspecified organism ([place])  AKI (acute kidney injury) ([place])  Urinary tract infection without hematuria, site unspecified     ED Discharge Orders     None        Note:  This document was prepared using Dragon voice recognition software and may include unintentional dictation errors.    Blake Divine, MD 10/15/21 [phone]

## 2021-10-15 NOTE — Progress Notes (Signed)
Natasha Estrada  Code Status: FULL Natasha Estrada is a 50 y.o. female patient admitted from ED awake, alert - oriented X4 - no acute distress noted. VSS -  no c/o shortness of breath, no c/o chest pain. Cardiac tele in place. Orientation to room and floor completed with information packet given to patient. Fall assessment complete, with patient able to verbalize understanding of risk associated with falls, and verbalized understanding to call nursing before up out of bed. Call light within reach, patient able to voice, and demonstrate understanding. Skin, clean-dry- intact without evidence of bruising, or skin tears.  No evidence of skin break down noted on exam.  ?  Will cont to eval and treat per MD orders.  Melonie Florida, RN  10/15/2021 6:35 PM

## 2021-10-15 NOTE — Consult Note (Signed)
Pharmacy Antibiotic Note  Natasha Estrada is a 50 y.o. female admitted on 10/15/2021 with UTI.  Pharmacy has been consulted for levofloxacin dosing.  Plan: Will order levofloxacin 750 mg q48H x 7 days.   Height: 5\' 4"  (162.6 cm) Weight: 68 kg (150 lb) IBW/kg (Calculated) : 54.7  Temp (24hrs), Avg:99.8 F (37.7 C), Min:99.8 F (37.7 C), Max:99.8 F (37.7 C)  Recent Labs  Lab 10/15/21 1222  WBC 19.0*  CREATININE 1.36*  LATICACIDVEN 1.0    Estimated Creatinine Clearance: 45.8 mL/min (A) (by C-G formula based on SCr of 1.36 mg/dL (H)).    Allergies  Allergen Reactions   Cefuroxime Shortness Of Breath    Tolerates Augmentin per husband Joe   Sulfa Antibiotics Rash     Microbiology results: 11/12 BCx: pending 11/12 UCx: pending    Thank you for allowing pharmacy to be a part of this patient's care.  Oswald Hillock 10/15/2021 3:30 PM

## 2021-10-15 NOTE — ED Provider Notes (Signed)
Emergency Medicine Provider Triage Evaluation Note  Natasha Estrada , a 50 y.o. female  was evaluated in triage.  Pt complains of possible sepsis from UTI.  Patient states that she is on trimethoprim daily as she has recurrent UTIs.  Her primary care advised her to stop it while she was being treated for an ear infection with Augmentin.  Patient started to develop UTI symptoms, has had abdominal pain, nausea, chills.  Is complaining of lower abdomen/pelvic pain as well.  Went to urgent care and had a syncopal episode.  Patient was found to be hypotensive.  Patient does have a history of repetitive urosepsis.  Still hypotensive at this time.  Mentating well at this time..  Review of Systems  Positive: Urinary symptom changes, hypotension, syncope Negative: No trauma sustained from syncopal episode, denies headache, neck pain, chest pain, shortness of breath, hematuria  Physical Exam  BP (!) 89/60 (BP Location: Left Arm)   Pulse 93   Temp 99.8 F (37.7 C) (Oral)   Resp 16   Ht 5\' 4"  (1.626 m)   Wt 68 kg   SpO2 93%   BMI 25.75 kg/m  Gen:   Awake, no distress   Resp:  Normal effort  MSK:   Moves extremities without difficulty  Other:  Bowel sounds x4 quadrants  Medical Decision Making  Medically screening exam initiated at 12:26 PM.  Appropriate orders placed.  Joyelle Siedlecki was informed that the remainder of the evaluation will be completed by another provider, this initial triage assessment does not replace that evaluation, and the importance of remaining in the ED until their evaluation is complete.  Patient presents with possible sepsis from UTI.  Patient has a history of recurrent UTIs and history of recurrent urosepsis.  Patient arrives hypotensive, with a syncopal episode.  Patient will have labs, EKG, fluids.  She is allergic to cephalosporins, sulfa drugs.  Is resistant typically to fluoroquinolones.   Brynda Peon 10/15/21 1226    Vladimir Crofts, MD 10/15/21  [phone]

## 2021-10-15 NOTE — Consult Note (Signed)
CODE SEPSIS - PHARMACY COMMUNICATION  **Broad Spectrum Antibiotics should be administered within 1 hour of Sepsis diagnosis**  Time Code Sepsis Called/Page Received: 1403  Antibiotics Ordered: Zosyn  Time of 1st antibiotic administration: 4825  Additional action taken by pharmacy: none  If necessary, Name of Provider/Nurse Contacted: n/a    Natasha Estrada PharmD, BCPS 10/15/2021 2:56 PM

## 2021-10-15 NOTE — ED Notes (Signed)
Rn informed bed assigned 

## 2021-10-15 NOTE — ED Triage Notes (Signed)
Pt reports having chills yesterday and worried about a UTI. Had a near syncopal episode at [facility] and found to be hypotensive. Endorses lower abd pain.

## 2021-10-16 DIAGNOSIS — I10 Essential (primary) hypertension: Secondary | ICD-10-CM

## 2021-10-16 DIAGNOSIS — Z8659 Personal history of other mental and behavioral disorders: Secondary | ICD-10-CM

## 2021-10-16 DIAGNOSIS — N10 Acute tubulo-interstitial nephritis: Secondary | ICD-10-CM

## 2021-10-16 DIAGNOSIS — N179 Acute kidney failure, unspecified: Secondary | ICD-10-CM

## 2021-10-16 DIAGNOSIS — R55 Syncope and collapse: Secondary | ICD-10-CM

## 2021-10-16 LAB — CBC
HCT: 31.3 % — ABNORMAL LOW (ref 36.0–46.0)
Hemoglobin: 9.9 g/dL — ABNORMAL LOW (ref 12.0–15.0)
MCH: 27.7 pg (ref 26.0–34.0)
MCHC: 31.6 g/dL (ref 30.0–36.0)
MCV: 87.4 fL (ref 80.0–100.0)
Platelets: 169 10*3/uL (ref 150–400)
RBC: 3.58 MIL/uL — ABNORMAL LOW (ref 3.87–5.11)
RDW: 13.5 % (ref 11.5–15.5)
WBC: 17.1 10*3/uL — ABNORMAL HIGH (ref 4.0–10.5)
nRBC: 0 % (ref 0.0–0.2)

## 2021-10-16 LAB — PROCALCITONIN: Procalcitonin: 2.79 ng/mL

## 2021-10-16 LAB — BASIC METABOLIC PANEL
Anion gap: 8 (ref 5–15)
BUN: 15 mg/dL (ref 6–20)
CO2: 23 mmol/L (ref 22–32)
Calcium: 8.4 mg/dL — ABNORMAL LOW (ref 8.9–10.3)
Chloride: 105 mmol/L (ref 98–111)
Creatinine, Ser: 1.13 mg/dL — ABNORMAL HIGH (ref 0.44–1.00)
GFR, Estimated: 59 mL/min — ABNORMAL LOW (ref >60)
Glucose, Bld: 105 mg/dL — ABNORMAL HIGH (ref 70–99)
Potassium: 3.7 mmol/L (ref 3.5–5.1)
Sodium: 136 mmol/L (ref 135–145)

## 2021-10-16 LAB — PROTIME-INR
INR: 1.3 — ABNORMAL HIGH (ref 0.8–1.2)
Prothrombin Time: 15.7 seconds — ABNORMAL HIGH (ref 11.4–15.2)

## 2021-10-16 LAB — CORTISOL-AM, BLOOD: Cortisol - AM: 30.3 ug/dL — ABNORMAL HIGH (ref 6.7–22.6)

## 2021-10-16 LAB — HIV ANTIBODY (ROUTINE TESTING W REFLEX): HIV Screen 4th Generation wRfx: NONREACTIVE

## 2021-10-16 MED ORDER — LEVOFLOXACIN IN D5W 750 MG/150ML IV SOLN
750.0000 mg | INTRAVENOUS | Status: DC
  Administered 2021-10-16: 750 mg via INTRAVENOUS
  Filled 2021-10-16 (×2): qty 150

## 2021-10-16 MED ORDER — NAPROXEN 375 MG PO TABS
375.0000 mg | ORAL_TABLET | Freq: Three times a day (TID) | ORAL | Status: DC | PRN
  Administered 2021-10-16 – 2021-10-17 (×2): 375 mg via ORAL
  Filled 2021-10-16 (×4): qty 1

## 2021-10-16 NOTE — Progress Notes (Signed)
PROGRESS NOTE  Natasha Estrada    DOB: 21-Dec-1970, 50 y.o.  AYT:[id number]  PCP: Natasha Denis, PA-C   Code Status: Full Code   DOA: 10/15/2021   LOS: 1  Brief Narrative of Current Hospitalization  Natasha Estrada is a 50 y.o. female with a PMH significant for recurrent UTI, HTN, GERD, nephrolithiasis, chronic pain syndrome. They presented from urgent care to the ED on 10/15/2021 with syncope 2/2 dehydration in setting of sepsis from urinary source which failed outpatient treatment. They were treated with IV Abx, and hydration.  Patient was admitted to medicine service for further workup and management of urosepsis as outlined in detail below.  10/16/21 -stable, improved  Assessment & Plan  Principal Problem:   Sepsis secondary to UTI [facility]) Active Problems:   History of depression   Nephrolithiasis   Acute pyelonephritis   Hypertension   Near syncope  Urosepsis- BxCx NGTD. Urine Cx pending. Intermittently febrile.  - continue Levaquin (11/12- and deescalate when able - tylenol, naproxen PRN - wean IV fluids when BP stable and taking PO  Depression/anxiety- chronic, stable -continue home bupropion, buspirone, cymbalta    DVT prophylaxis: enoxaparin (LOVENOX) injection 40 mg Start: 10/15/21 2200   Diet:  Diet Orders (From admission, onward)     Start     Ordered   10/15/21 1524  Diet 2 gram sodium Room service appropriate? Yes; Fluid consistency: Thin  Diet effective now       Question Answer Comment  Room service appropriate? Yes   Fluid consistency: Thin      10/15/21 1524            Subjective 10/16/21    Pt reports feeling improved. Intermittent fever and chills. Continues to have headache mildly improved with tylenol  Disposition Plan & Communication  Patient status: Inpatient  Admitted From: Home Disposition: Home Anticipated discharge date: 11/15  Family Communication: at bedside  Consults, Procedures, Significant Events  Consultants:   none  Procedures/significant events:  none Antimicrobials:  Anti-infectives (From admission, onward)    Start     Dose/Rate Route Frequency Ordered Stop   10/17/21 1000  levofloxacin (LEVAQUIN) IVPB 750 mg        750 mg 100 mL/hr over 90 Minutes Intravenous Every 48 hours 10/15/21 1530 10/23/21 0959   10/15/21 1530  levofloxacin (LEVAQUIN) IVPB 750 mg        750 mg 100 mL/hr over 90 Minutes Intravenous  Once 10/15/21 1524 10/15/21 1723   10/15/21 1415  piperacillin-tazobactam (ZOSYN) IVPB 3.375 g        3.375 g 100 mL/hr over 30 Minutes Intravenous  Once 10/15/21 1403 10/15/21 1517       Objective   Vitals:   10/16/21 0113 10/16/21 0300 10/16/21 0508 10/16/21 0518  BP: (!) 147/86  123/66 131/69  Pulse: (!) 107  98 74  Resp: 16  16 16   Temp: 98.7 F (37.1 C) (!) 103 F (39.4 C) 98.3 F (36.8 C) 97.8 F (36.6 C)  TempSrc:      SpO2: 97%  95% (!) 86%  Weight:      Height:        Intake/Output Summary (Last 24 hours) at 10/16/2021 0759 Last data filed at 10/16/2021 0653 Gross per 24 hour  Intake 1783.88 ml  Output --  Net 1783.88 ml   Filed Weights   10/15/21 1205  Weight: 68 kg    Patient BMI: Body mass index is 25.75 kg/m.   Physical Exam: General: awake, alert,  NAD HEENT: atraumatic, clear conjunctiva, anicteric sclera, moist mucus membranes, hearing grossly normal Respiratory: normal respiratory effort. Cardiovascular: normal S1/S2,  RRR, no JVD, murmurs, rubs, gallops, quick capillary refill  Gastrointestinal: soft, NT, ND, no HSM felt Nervous: A&O x3. no gross focal neurologic deficits, normal speech Extremities: moves all equally, no edema, normal tone Skin: dry, intact, normal temperature, normal color, No rashes, lesions or ulcers Psychiatry: normal mood, congruent affect  Labs   I have personally reviewed following labs and imaging studies Admission on 10/15/2021  Component Date Value Ref Range Status   Color, Urine 10/15/2021 YELLOW (A)   YELLOW Final   APPearance 10/15/2021 CLOUDY (A)  CLEAR Final   Specific Gravity, Urine 10/15/2021 1.011  1.005 - 1.030 Final   pH 10/15/2021 5.0  5.0 - 8.0 Final   Glucose, UA 10/15/2021 NEGATIVE  NEGATIVE mg/dL Final   Hgb urine dipstick 10/15/2021 MODERATE (A)  NEGATIVE Final   Bilirubin Urine 10/15/2021 NEGATIVE  NEGATIVE Final   Ketones, ur 10/15/2021 NEGATIVE  NEGATIVE mg/dL Final   Protein, ur 10/15/2021 100 (A)  NEGATIVE mg/dL Final   Nitrite 10/15/2021 NEGATIVE  NEGATIVE Final   Leukocytes,Ua 10/15/2021 LARGE (A)  NEGATIVE Final   RBC / HPF 10/15/2021 21-50  0 - 5 RBC/hpf Final   WBC, UA 10/15/2021 >50 (A)  0 - 5 WBC/hpf Final   Bacteria, UA 10/15/2021 MANY (A)  NONE SEEN Final   Squamous Epithelial / LPF 10/15/2021 11-20  0 - 5 Final   WBC Clumps 10/15/2021 PRESENT   Final   Mucus 10/15/2021 PRESENT   Final   Hyaline Casts, UA 10/15/2021 PRESENT   Final   Specimen Description 10/15/2021 BLOOD LEFT ANTECUBITAL   Final   Special Requests 10/15/2021 BOTTLES DRAWN AEROBIC AND ANAEROBIC Blood Culture adequate volume   Final   Culture 10/15/2021    Final                   Value:NO GROWTH < 24 HOURS Performed at [facility], [address] [place], [place] [address]    Report Status 10/15/2021 PENDING   Incomplete   Specimen Description 10/15/2021 BLOOD BLOOD RIGHT HAND   Final   Special Requests 10/15/2021 BOTTLES DRAWN AEROBIC AND ANAEROBIC Blood Culture adequate volume   Final   Culture 10/15/2021    Final                   Value:NO GROWTH < 24 HOURS Performed at [facility], [address] [place], [place] [address]    Report Status 10/15/2021 PENDING   Incomplete   Sodium 10/15/2021 135  135 - 145 mmol/L Final   Potassium 10/15/2021 3.6  3.5 - 5.1 mmol/L Final   Chloride 10/15/2021 105  98 - 111 mmol/L Final   CO2 10/15/2021 24  22 - 32 mmol/L Final   Glucose, Bld 10/15/2021 127 (A)  70 - 99 mg/dL Final   BUN 10/15/2021 15  6 - 20 mg/dL Final    Creatinine, Ser 10/15/2021 1.36 (A)  0.44 - 1.00 mg/dL Final   Calcium 10/15/2021 8.0 (A)  8.9 - 10.3 mg/dL Final   Total Protein 10/15/2021 6.4 (A)  6.5 - 8.1 g/dL Final   Albumin 10/15/2021 3.3 (A)  3.5 - 5.0 g/dL Final   AST 10/15/2021 25  15 - 41 U/L Final   ALT 10/15/2021 23  0 - 44 U/L Final   Alkaline Phosphatase 10/15/2021 81  38 - 126 U/L Final  Total Bilirubin 10/15/2021 1.9 (A)  0.3 - 1.2 mg/dL Final   GFR, Estimated 10/15/2021 47 (A)  >60 mL/min Final   Anion gap 10/15/2021 6  5 - 15 Final   WBC 10/15/2021 19.0 (A)  4.0 - 10.5 K/uL Final   RBC 10/15/2021 3.93  3.87 - 5.11 MIL/uL Final   Hemoglobin 10/15/2021 11.3 (A)  12.0 - 15.0 g/dL Final   HCT 10/15/2021 34.5 (A)  36.0 - 46.0 % Final   MCV 10/15/2021 87.8  80.0 - 100.0 fL Final   MCH 10/15/2021 28.8  26.0 - 34.0 pg Final   MCHC 10/15/2021 32.8  30.0 - 36.0 g/dL Final   RDW 10/15/2021 13.6  11.5 - 15.5 % Final   Platelets 10/15/2021 178  150 - 400 K/uL Final   nRBC 10/15/2021 0.0  0.0 - 0.2 % Final   Neutrophils Relative % 10/15/2021 84  % Final   Neutro Abs 10/15/2021 15.9 (A)  1.7 - 7.7 K/uL Final   Lymphocytes Relative 10/15/2021 3  % Final   Lymphs Abs 10/15/2021 0.5 (A)  0.7 - 4.0 K/uL Final   Monocytes Relative 10/15/2021 12  % Final   Monocytes Absolute 10/15/2021 2.3 (A)  0.1 - 1.0 K/uL Final   Eosinophils Relative 10/15/2021 0  % Final   Eosinophils Absolute 10/15/2021 0.1  0.0 - 0.5 K/uL Final   Basophils Relative 10/15/2021 0  % Final   Basophils Absolute 10/15/2021 0.0  0.0 - 0.1 K/uL Final   Immature Granulocytes 10/15/2021 1  % Final   Abs Immature Granulocytes 10/15/2021 0.19 (A)  0.00 - 0.07 K/uL Final   Lactic Acid, Venous 10/15/2021 1.0  0.5 - 1.9 mmol/L Final   Troponin I (High Sensitivity) 10/15/2021 4  <18 ng/L Final   SARS Coronavirus 2 by RT PCR 10/15/2021 NEGATIVE  NEGATIVE Final   Influenza A by PCR 10/15/2021 NEGATIVE  NEGATIVE Final   Influenza B by PCR 10/15/2021 NEGATIVE  NEGATIVE Final    Prothrombin Time 10/16/2021 15.7 (A)  11.4 - 15.2 seconds Final   INR 10/16/2021 1.3 (A)  0.8 - 1.2 Final   Procalcitonin 10/16/2021 2.79  ng/mL Final   Sodium 10/16/2021 136  135 - 145 mmol/L Final   Potassium 10/16/2021 3.7  3.5 - 5.1 mmol/L Final   Chloride 10/16/2021 105  98 - 111 mmol/L Final   CO2 10/16/2021 23  22 - 32 mmol/L Final   Glucose, Bld 10/16/2021 105 (A)  70 - 99 mg/dL Final   BUN 10/16/2021 15  6 - 20 mg/dL Final   Creatinine, Ser 10/16/2021 1.13 (A)  0.44 - 1.00 mg/dL Final   Calcium 10/16/2021 8.4 (A)  8.9 - 10.3 mg/dL Final   GFR, Estimated 10/16/2021 59 (A)  >60 mL/min Final   Anion gap 10/16/2021 8  5 - 15 Final   WBC 10/16/2021 17.1 (A)  4.0 - 10.5 K/uL Final   RBC 10/16/2021 3.58 (A)  3.87 - 5.11 MIL/uL Final   Hemoglobin 10/16/2021 9.9 (A)  12.0 - 15.0 g/dL Final   HCT 10/16/2021 31.3 (A)  36.0 - 46.0 % Final   MCV 10/16/2021 87.4  80.0 - 100.0 fL Final   MCH 10/16/2021 27.7  26.0 - 34.0 pg Final   MCHC 10/16/2021 31.6  30.0 - 36.0 g/dL Final   RDW 10/16/2021 13.5  11.5 - 15.5 % Final   Platelets 10/16/2021 169  150 - 400 K/uL Final   nRBC 10/16/2021 0.0  0.0 - 0.2 % Final  Imaging Studies  DG Chest 2 View  Result Date: 10/15/2021 CLINICAL DATA:  Possible sepsis from urinary tract infection. EXAM: CHEST - 2 VIEW COMPARISON:  Apr 28, 2020 FINDINGS: The heart size and mediastinal contours are within normal limits. Both lungs are clear. Chronic changes of several lower right ribs are identified. The bones are otherwise unchanged. IMPRESSION: No active cardiopulmonary disease. Electronically Signed   By: Abelardo Diesel M.D.   On: 10/15/2021 13:00   Medications   Scheduled Meds:  buPROPion  150 mg Oral Daily   busPIRone  15 mg Oral BID   cyclobenzaprine  10 mg Oral QPM   DULoxetine  60 mg Oral Daily   enoxaparin (LOVENOX) injection  40 mg Subcutaneous Q24H   gabapentin  600 mg Oral QHS   pantoprazole  40 mg Oral Daily   zolpidem  5 mg Oral QHS    busPIRone (BUSPAR) tablet 15 mg cyclobenzaprine (FLEXERIL) tablet 10 mg gabapentin (NEURONTIN) capsule 600 mg  LOS: 1 day   Time spent: >73min  Richarda Osmond, DO [facility] 10/16/2021, 7:59 AM   Please refer to amion to contact the [facility] Attending or Consulting provider for this pt  [email or web address] Available by Epic secure chat 7AM-7PM. If 7PM-7AM, please contact night-coverage

## 2021-10-16 NOTE — Consult Note (Signed)
Pharmacy Antibiotic Note  Natasha Estrada is a 50 y.o. female with PMH of HTN, GERD, nephrolithiasis, chronic pain syndrome admitted on 10/15/2021 with UTI.  Pharmacy has been consulted for levofloxacin dosing. Since admission her renal function has improved  Plan: adjust levofloxacin to 750 mg IV every 24 hours x 7 days total   Height: 5\' 4"  (162.6 cm) Weight: 68 kg (150 lb) IBW/kg (Calculated) : 54.7  Temp (24hrs), Avg:99.3 F (37.4 C), Min:97.8 F (36.6 C), Max:103 F (39.4 C)  Recent Labs  Lab 10/15/21 1222 10/16/21 0436  WBC 19.0* 17.1*  CREATININE 1.36* 1.13*  LATICACIDVEN 1.0  --      Estimated Creatinine Clearance: 55.2 mL/min (A) (by C-G formula based on SCr of 1.13 mg/dL (H)).    Allergies  Allergen Reactions   Cefuroxime Shortness Of Breath    Tolerates Augmentin per husband Joe   Sulfa Antibiotics Rash    Microbiology results: 11/12 BCx: NG < 24 hours 11/12 UCx: pending  11/12 SARS CoV-2: negative 11/12: influenza A/B: negative  Thank you for allowing pharmacy to be a part of this patient's care.  Dallie Piles 10/16/2021 10:01 AM

## 2021-10-17 ENCOUNTER — Other Ambulatory Visit: Payer: Self-pay

## 2021-10-17 DIAGNOSIS — N390 Urinary tract infection, site not specified: Secondary | ICD-10-CM

## 2021-10-17 LAB — CBC
HCT: 29.7 % — ABNORMAL LOW (ref 36.0–46.0)
Hemoglobin: 9.5 g/dL — ABNORMAL LOW (ref 12.0–15.0)
MCH: 27.7 pg (ref 26.0–34.0)
MCHC: 32 g/dL (ref 30.0–36.0)
MCV: 86.6 fL (ref 80.0–100.0)
Platelets: 199 10*3/uL (ref 150–400)
RBC: 3.43 MIL/uL — ABNORMAL LOW (ref 3.87–5.11)
RDW: 13.8 % (ref 11.5–15.5)
WBC: 13.6 10*3/uL — ABNORMAL HIGH (ref 4.0–10.5)
nRBC: 0 % (ref 0.0–0.2)

## 2021-10-17 LAB — URINE CULTURE: Culture: >=100000 — AB

## 2021-10-17 MED ORDER — EPINEPHRINE 0.3 MG/0.3ML IJ SOAJ: 0.3000 mg | Freq: Once | INTRAMUSCULAR | 0 refills | Status: AC | PRN

## 2021-10-17 MED ORDER — CEPHALEXIN 500 MG PO CAPS: 500.0000 mg | ORAL_CAPSULE | Freq: Three times a day (TID) | ORAL | 0 refills | Status: AC

## 2021-10-17 MED ORDER — FLUTICASONE PROPIONATE 50 MCG/ACT NA SUSP
1.0000 | Freq: Every day | NASAL | Status: DC
  Administered 2021-10-17: 1 via NASAL
  Filled 2021-10-17: qty 16

## 2021-10-17 MED ORDER — EPINEPHRINE 0.3 MG/0.3ML IJ SOAJ
0.3000 mg | Freq: Once | INTRAMUSCULAR | Status: DC | PRN
  Filled 2021-10-17: qty 0.3

## 2021-10-17 MED ORDER — PSEUDOEPHEDRINE HCL 30 MG PO TABS
30.0000 mg | ORAL_TABLET | Freq: Four times a day (QID) | ORAL | Status: DC | PRN
  Administered 2021-10-17: 30 mg via ORAL
  Filled 2021-10-17 (×2): qty 1

## 2021-10-17 MED ORDER — ACETAMINOPHEN 325 MG PO TABS: 650.0000 mg | ORAL_TABLET | Freq: Four times a day (QID) | ORAL | Status: AC | PRN

## 2021-10-17 MED ORDER — NAPROXEN 375 MG PO TABS: 375.0000 mg | ORAL_TABLET | Freq: Three times a day (TID) | ORAL | 0 refills | Status: AC | PRN

## 2021-10-17 MED ORDER — ZOLPIDEM TARTRATE 5 MG PO TABS: 5.0000 mg | ORAL_TABLET | Freq: Every day | ORAL | Status: DC

## 2021-10-17 MED ORDER — DIPHENHYDRAMINE HCL 50 MG/ML IJ SOLN: 25.0000 mg | Freq: Once | INTRAMUSCULAR | 0 refills | Status: DC | PRN

## 2021-10-17 MED ORDER — CEPHALEXIN 500 MG PO CAPS
500.0000 mg | ORAL_CAPSULE | Freq: Three times a day (TID) | ORAL | Status: DC
  Administered 2021-10-17: 500 mg via ORAL
  Filled 2021-10-17 (×2): qty 1

## 2021-10-17 MED ORDER — FAMOTIDINE IN NACL 20-0.9 MG/50ML-% IV SOLN
20.0000 mg | Freq: Once | INTRAVENOUS | Status: AC
Start: 2021-10-17 — End: 2021-10-17
  Administered 2021-10-17: 20 mg via INTRAVENOUS
  Filled 2021-10-17: qty 50

## 2021-10-17 MED ORDER — DIPHENHYDRAMINE HCL 50 MG/ML IJ SOLN: 25.0000 mg | Freq: Once | INTRAMUSCULAR | Status: DC | PRN

## 2021-10-18 NOTE — Discharge Summary (Signed)
Physician Discharge Summary  Natasha Estrada SEG:[id number] DOB: [birth year] DOA: 10/15/2021  PCP: Wayland Denis, PA-C  Admit date: 10/15/2021 Discharge date: 10/18/2021  Admitted From: Home Disposition: Home  Recommendations for Outpatient Follow-up:  Follow up with PCP within 1-2 weeks  Discharge Condition:stable, improved CODE STATUS:  Code Status: Prior  Regular healthy diet  Brief/Interim Summary: Pt admitted for sepsis from acute pyelonephritis. She was started on IV levoquin as well as supportive care including tylenol and mIVF. She made significant improvement on treatment and remained stable. Blood cultures and sensitivities resulted on day 2 of admission to reveal that her infection was sensitive to cephalosporins. To exercise caution in prescribing this antibiotic given patient's previously documented penicillin allergy, she was observed for period after first dose of keflex and tolerated well. She was discharged in stable condition with instructions to follow up with PCP. In addition, patient complained of sinus congestion which was treated with decongestants.  Otherwise, she remained on chronic medications and was discharged in improved condition.  Discharge Diagnoses:  Principal Problem:   Sepsis without acute organ dysfunction (HCC) Active Problems:   History of depression   Nephrolithiasis   Acute pyelonephritis   Hypertension   Near syncope   AKI (acute kidney injury) ([place])   Urinary tract infection without hematuria   Discharge Instructions     Discharge patient   Complete by: As directed    Can discharge 1-2 hours following ceflex administrated if tolerates   Discharge disposition: 01-Home or Self Care   Discharge patient date: 10/17/2021      Allergies as of 10/17/2021       Reactions   Cefuroxime Shortness Of Breath   Tolerates Augmentin per husband Joe   Sulfa Antibiotics Rash        Medication List     STOP taking these medications     cholecalciferol 25 MCG (1000 UNIT) tablet Commonly known as: VITAMIN D3   ibuprofen 800 MG tablet Commonly known as: ADVIL   levofloxacin 500 MG tablet Commonly known as: Levaquin   linaclotide 145 MCG Caps capsule Commonly known as: LINZESS   omeprazole 20 MG capsule Commonly known as: PRILOSEC   oxyCODONE 5 MG immediate release tablet Commonly known as: Roxicodone   trimethoprim 100 MG tablet Commonly known as: TRIMPEX   vitamin B-12 1000 MCG tablet Commonly known as: CYANOCOBALAMIN       TAKE these medications    acetaminophen 325 MG tablet Commonly known as: TYLENOL Take 2 tablets (650 mg total) by mouth every 6 (six) hours as needed for mild pain (or Fever >/= 101).   buPROPion 150 MG 24 hr tablet Commonly known as: WELLBUTRIN XL Take 150 mg by mouth daily.   busPIRone 15 MG tablet Commonly known as: BUSPAR Take 15 mg by mouth 2 (two) times daily.   cephALEXin 500 MG capsule Commonly known as: KEFLEX Take 1 capsule (500 mg total) by mouth every 8 (eight) hours for 9 days.   diphenhydrAMINE 50 MG/ML injection Commonly known as: BENADRYL Inject 0.5 mLs (25 mg total) into the vein once as needed for up to 8 days (if patient exhibits significant signs and symptoms of allergic reaction.).   DULoxetine 60 MG capsule Commonly known as: CYMBALTA Take 60 mg by mouth daily.   EPINEPHrine 0.3 mg/0.3 mL Soaj injection Commonly known as: EPI-PEN Inject 0.3 mg into the muscle once as needed (if patient exhibits significant signs and symptoms of allergic reaction.).   fluticasone 50 MCG/ACT nasal spray Commonly  known as: FLONASE Place 1 spray into both nostrils 2 (two) times daily.   levonorgestrel 20 MCG/24HR IUD Commonly known as: MIRENA by Intrauterine route.   losartan 50 MG tablet Commonly known as: COZAAR Take 1 tablet by mouth daily.   naproxen 375 MG tablet Commonly known as: NAPROSYN Take 1 tablet (375 mg total) by mouth 3 (three) times daily  as needed for up to 7 days for mild pain or headache.   propranolol 40 MG tablet Commonly known as: INDERAL Take 40 mg by mouth at bedtime.   pseudoephedrine 120 MG 12 hr tablet Commonly known as: SUDAFED Take 120 mg by mouth daily as needed for congestion.   Saxenda 18 MG/3ML Sopn Generic drug: Liraglutide -Weight Management Inject 3 mg into the skin daily.   zolpidem 5 MG tablet Commonly known as: AMBIEN Take 1 tablet (5 mg total) by mouth at bedtime. What changed:  medication strength how much to take        Allergies  Allergen Reactions   Cefuroxime Shortness Of Breath    Tolerates Augmentin per husband Joe   Sulfa Antibiotics Rash   Consultations: none  Procedures/Studies: DG Chest 2 View  Result Date: 10/15/2021 CLINICAL DATA:  Possible sepsis from urinary tract infection. EXAM: CHEST - 2 VIEW COMPARISON:  Apr 28, 2020 FINDINGS: The heart size and mediastinal contours are within normal limits. Both lungs are clear. Chronic changes of several lower right ribs are identified. The bones are otherwise unchanged. IMPRESSION: No active cardiopulmonary disease. Electronically Signed   By: Abelardo Diesel M.D.   On: 10/15/2021 13:00    Subjective: Patient endorses good PO intake, no fever/chills. Improvement in sinus congestion given treatment. Much improvement in dysuria.   Discharge Exam: Vitals:   10/17/21 1133 10/17/21 1424  BP: 108/70 123/83  Pulse: 88 89  Resp: 18 18  Temp: 98.1 F (36.7 C) 98.2 F (36.8 C)  SpO2: 99% 98%    General: Pt is alert, awake, not in acute distress Cardiovascular: RRR, S1/S2 +, no rubs, no gallops Respiratory: CTA bilaterally, no wheezing, no rhonchi Abdominal: Soft, NT, ND, bowel sounds + Extremities: no edema, no cyanosis  Labs: Basic Metabolic Panel: Recent Labs  Lab 10/15/21 1222 10/16/21 0436  NA 135 136  K 3.6 3.7  CL 105 105  CO2 24 23  GLUCOSE 127* 105*  BUN 15 15  CREATININE 1.36* 1.13*  CALCIUM 8.0* 8.4*    CBC: Recent Labs  Lab 10/15/21 1222 10/16/21 0436 10/17/21 0518  WBC 19.0* 17.1* 13.6*  NEUTROABS 15.9*  --   --   HGB 11.3* 9.9* 9.5*  HCT 34.5* 31.3* 29.7*  MCV 87.8 87.4 86.6  PLT 178 169 199    Microbiology Recent Results (from the past 240 hour(s))  Urine Culture     Status: Abnormal   Collection Time: 10/15/21 12:21 PM   Specimen: Urine, Random  Result Value Ref Range Status   Specimen Description   Final    URINE, RANDOM Performed at [facility], [address], [place], [place] [address]    Special Requests   Final    NONE Performed at [facility], [address] [place], [place] [address]    Culture >=100,000 COLONIES/mL ESCHERICHIA COLI (A)  Final   Report Status 10/17/2021 FINAL  Final   Organism ID, Bacteria ESCHERICHIA COLI (A)  Final      Susceptibility   Escherichia coli - MIC*    AMPICILLIN >=32 RESISTANT Resistant  CEFAZOLIN <=4 SENSITIVE Sensitive     CEFEPIME <=0.12 SENSITIVE Sensitive     CEFTRIAXONE <=0.25 SENSITIVE Sensitive     CIPROFLOXACIN >=4 RESISTANT Resistant     GENTAMICIN <=1 SENSITIVE Sensitive     IMIPENEM <=0.25 SENSITIVE Sensitive     NITROFURANTOIN <=16 SENSITIVE Sensitive     TRIMETH/SULFA >=320 RESISTANT Resistant     AMPICILLIN/SULBACTAM >=32 RESISTANT Resistant     PIP/TAZO <=4 SENSITIVE Sensitive     * >=100,000 COLONIES/mL ESCHERICHIA COLI  Resp Panel by RT-PCR (Flu A&B, Covid) Nasopharyngeal Swab     Status: None   Collection Time: 10/15/21  2:10 PM   Specimen: Nasopharyngeal Swab; Nasopharyngeal(NP) swabs in vial transport medium  Result Value Ref Range Status   SARS Coronavirus 2 by RT PCR NEGATIVE NEGATIVE Final    Comment: (NOTE) SARS-CoV-2 target nucleic acids are NOT DETECTED.  The SARS-CoV-2 RNA is generally detectable in upper respiratory specimens during the acute phase of infection. The lowest concentration of SARS-CoV-2 viral copies this assay can detect is 138 copies/mL. A  negative result does not preclude SARS-Cov-2 infection and should not be used as the sole basis for treatment or other patient management decisions. A negative result may occur with  improper specimen collection/handling, submission of specimen other than nasopharyngeal swab, presence of viral mutation(s) within the areas targeted by this assay, and inadequate number of viral copies(<138 copies/mL). A negative result must be combined with clinical observations, patient history, and epidemiological information. The expected result is Negative.  Fact Sheet for Patients:  EntrepreneurPulse.com.au  Fact Sheet for Healthcare Providers:  IncredibleEmployment.be  This test is no t yet approved or cleared by the [place] FDA and  has been authorized for detection and/or diagnosis of SARS-CoV-2 by FDA under an Emergency Use Authorization (EUA). This EUA will remain  in effect (meaning this test can be used) for the duration of the COVID-19 declaration under Section 564(b)(1) of the Act, 21 U.S.C.section 360bbb-3(b)(1), unless the authorization is terminated  or revoked sooner.       Influenza A by PCR NEGATIVE NEGATIVE Final   Influenza B by PCR NEGATIVE NEGATIVE Final    Comment: (NOTE) The Xpert Xpress SARS-CoV-2/FLU/RSV plus assay is intended as an aid in the diagnosis of influenza from Nasopharyngeal swab specimens and should not be used as a sole basis for treatment. Nasal washings and aspirates are unacceptable for Xpert Xpress SARS-CoV-2/FLU/RSV testing.  Fact Sheet for Patients: EntrepreneurPulse.com.au  Fact Sheet for Healthcare Providers: IncredibleEmployment.be  This test is not yet approved or cleared by the [place] FDA and has been authorized for detection and/or diagnosis of SARS-CoV-2 by FDA under an Emergency Use Authorization (EUA). This EUA will remain in effect (meaning this test can  be used) for the duration of the COVID-19 declaration under Section 564(b)(1) of the Act, 21 U.S.C. section 360bbb-3(b)(1), unless the authorization is terminated or revoked.  Performed at [facility], [address] [place], [place] [address]   Culture, blood (routine x 2)     Status: None (Preliminary result)   Collection Time: 10/15/21  2:13 PM   Specimen: BLOOD  Result Value Ref Range Status   Specimen Description BLOOD LEFT ANTECUBITAL  Final   Special Requests   Final    BOTTLES DRAWN AEROBIC AND ANAEROBIC Blood Culture adequate volume   Culture   Final    NO GROWTH 2 DAYS Performed at [facility], [address] [place], [place] [address]    Report  Status PENDING  Incomplete  Culture, blood (routine x 2)     Status: None (Preliminary result)   Collection Time: 10/15/21  2:13 PM   Specimen: BLOOD  Result Value Ref Range Status   Specimen Description BLOOD BLOOD RIGHT HAND  Final   Special Requests   Final    BOTTLES DRAWN AEROBIC AND ANAEROBIC Blood Culture adequate volume   Culture   Final    NO GROWTH 2 DAYS Performed at [facility], [address], [place], [place] [address]    Report Status PENDING  Incomplete    Time coordinating discharge: Over 30 minutes  Richarda Osmond, MD  [facility] 10/18/2021, 1:08 PM Pager   If 7PM-7AM, please contact night-coverage [email or web address] Password [email or web address]

## 2021-10-20 LAB — CULTURE, BLOOD (ROUTINE X 2)
Culture: NO GROWTH
Culture: NO GROWTH
Special Requests: ADEQUATE
Special Requests: ADEQUATE

## 2021-10-25 ENCOUNTER — Other Ambulatory Visit: Payer: Self-pay | Admitting: Student in an Organized Health Care Education/Training Program

## 2021-11-11 ENCOUNTER — Other Ambulatory Visit: Payer: Self-pay | Admitting: *Deleted

## 2021-11-11 ENCOUNTER — Ambulatory Visit
Admission: RE | Admit: 2021-11-11 | Discharge: 2021-11-11 | Disposition: A | Discharge status: Home / Self Care | Payer: Federal, State, Local not specified - PPO | Source: Ambulatory Visit | Attending: Urology | Admitting: Urology

## 2021-11-11 ENCOUNTER — Telehealth: Payer: Self-pay | Admitting: Urology

## 2021-11-11 ENCOUNTER — Encounter: Payer: Self-pay | Admitting: Urology

## 2021-11-11 ENCOUNTER — Ambulatory Visit
Admission: RE | Admit: 2021-11-11 | Discharge: 2021-11-11 | Disposition: A | Discharge status: Home / Self Care | Payer: Federal, State, Local not specified - PPO | Attending: Urology | Admitting: Urology

## 2021-11-11 ENCOUNTER — Ambulatory Visit: Payer: Federal, State, Local not specified - PPO | Admitting: Urology

## 2021-11-11 ENCOUNTER — Other Ambulatory Visit: Payer: Self-pay

## 2021-11-11 ENCOUNTER — Other Ambulatory Visit
Admission: RE | Admit: 2021-11-11 | Discharge: 2021-11-11 | Disposition: A | Discharge status: Home / Self Care | Payer: Federal, State, Local not specified - PPO | Source: Ambulatory Visit | Attending: Urology | Admitting: Urology

## 2021-11-11 VITALS — BP 88/62 | HR 88 | Temp 98.1°F | Ht 64.0 in | Wt 145.0 lb

## 2021-11-11 DIAGNOSIS — N390 Urinary tract infection, site not specified: Secondary | ICD-10-CM | POA: Diagnosis not present

## 2021-11-11 DIAGNOSIS — R109 Unspecified abdominal pain: Secondary | ICD-10-CM

## 2021-11-11 DIAGNOSIS — N200 Calculus of kidney: Secondary | ICD-10-CM | POA: Diagnosis present

## 2021-11-11 DIAGNOSIS — Z87442 Personal history of urinary calculi: Secondary | ICD-10-CM

## 2021-11-11 LAB — CBC WITH DIFFERENTIAL/PLATELET
Abs Immature Granulocytes: 0.35 10*3/uL — ABNORMAL HIGH (ref 0.00–0.07)
Basophils Absolute: 0.1 10*3/uL (ref 0.0–0.1)
Basophils Relative: 0 %
Eosinophils Absolute: 0 10*3/uL (ref 0.0–0.5)
Eosinophils Relative: 0 %
HCT: 37.2 % (ref 36.0–46.0)
Hemoglobin: 11.9 g/dL — ABNORMAL LOW (ref 12.0–15.0)
Immature Granulocytes: 1 %
Lymphocytes Relative: 3 %
Lymphs Abs: 0.8 10*3/uL (ref 0.7–4.0)
MCH: 28.1 pg (ref 26.0–34.0)
MCHC: 32 g/dL (ref 30.0–36.0)
MCV: 87.7 fL (ref 80.0–100.0)
Monocytes Absolute: 1.5 10*3/uL — ABNORMAL HIGH (ref 0.1–1.0)
Monocytes Relative: 6 %
Neutro Abs: 22.7 10*3/uL — ABNORMAL HIGH (ref 1.7–7.7)
Neutrophils Relative %: 90 %
Platelets: 196 10*3/uL (ref 150–400)
RBC: 4.24 MIL/uL (ref 3.87–5.11)
RDW: 13.8 % (ref 11.5–15.5)
WBC: 25.4 10*3/uL — ABNORMAL HIGH (ref 4.0–10.5)
nRBC: 0 % (ref 0.0–0.2)

## 2021-11-11 LAB — BASIC METABOLIC PANEL
Anion gap: 7 (ref 5–15)
BUN: 25 mg/dL — ABNORMAL HIGH (ref 6–20)
CO2: 25 mmol/L (ref 22–32)
Calcium: 8.8 mg/dL — ABNORMAL LOW (ref 8.9–10.3)
Chloride: 103 mmol/L (ref 98–111)
Creatinine, Ser: 1.69 mg/dL — ABNORMAL HIGH (ref 0.44–1.00)
GFR, Estimated: 36 mL/min — ABNORMAL LOW (ref >60)
Glucose, Bld: 158 mg/dL — ABNORMAL HIGH (ref 70–99)
Potassium: 4.2 mmol/L (ref 3.5–5.1)
Sodium: 135 mmol/L (ref 135–145)

## 2021-11-11 LAB — URINALYSIS, COMPLETE
Bilirubin, UA: NEGATIVE
Glucose, UA: NEGATIVE
Ketones, UA: NEGATIVE
Nitrite, UA: POSITIVE — AB
Specific Gravity, UA: 1.015 (ref 1.005–1.030)
Urobilinogen, Ur: 0.2 mg/dL (ref 0.2–1.0)
pH, UA: 5.5 (ref 5.0–7.5)

## 2021-11-11 LAB — LACTIC ACID, PLASMA: Lactic Acid, Venous: 1.7 mmol/L (ref 0.5–1.9)

## 2021-11-11 LAB — MICROSCOPIC EXAMINATION: WBC, UA: >30 /hpf — ABNORMAL HIGH (ref 0–5)

## 2021-11-11 MED ORDER — CEPHALEXIN 500 MG PO CAPS: 500.0000 mg | ORAL_CAPSULE | Freq: Three times a day (TID) | ORAL | 0 refills | Status: AC

## 2021-11-11 MED ORDER — GENTAMICIN SULFATE 40 MG/ML IJ SOLN
80.0000 mg | Freq: Once | INTRAMUSCULAR | Status: AC
  Administered 2021-11-11: 80 mg via INTRAMUSCULAR

## 2021-11-11 NOTE — Progress Notes (Signed)
09/16/2020 8:59 PM   Natasha Estrada 1971/10/28 [id number]  Referring provider: Wayland Denis, PA-C [address] [place],  [place] [address]  Chief Complaint  Patient presents with   Recurrent UTI   Nephrolithiasis    Urological history: 1. Possible small proteinaceous/hemorrhagic cyst -Incidental finding found on recent Lumbar MRI in 07/2020.  -Renal mass protocol MRI scheduled 10/14/2020 Too small to characterize on post-contrast imaging, but strongly favored to represent a hemorrhagic cyst (especially given size regression).  This could be re-evaluated with follow-up pre and post contrast abdominal MRI at 1 year, if desired. -repeat MRI ordered for 10/2021  2.  Nephrolithiasis -CT renal stone study 09/2020 Bilateral nonobstructing renal calculi are again identified with stable burden  3. rUTI's - risk factors of age, constipation and incontinence -cysto 05/2020 was NED  -documented positive urine culture over the last year  E. coli resistant to Cipro and Levaquin on Apr 25, 2000  E. coli resistant to Cipro and Levaquin on September 16, 2020   HPI: Natasha Estrada is a 50 y.o. female who presents for who was recently admitted for UTI with sepsis who presents today stating that her UTI symptoms are returning and she has fever with her mother, Natasha Estrada.    UA nitrite positive, > 30 WBC's and many bacteria  WBC on today's lab 25.4.  Lactic acid 1.7.  Serum creatinine 1.69.  KUB stool and gas present making it difficult to evaluate for stones.  She was recently admitted for pyelonephrosis with sepsis secondary to E.coli.  No images was performed while hospitalized.  She states she started to feel poorly over the last few days.  Last night, she spent the night with her mother as she was concerned about her well being.   She has been feeling weak, experiencing urinary frequency, suprapubic pain, lower back pain, fevers (mother did not have a temperature,  but states that she was burning up when she felt her), chills and nausea.    She took a Motrin this morning at 6 am and states she has not had anything to eat or drink since then.    STAT CT upper pole renal stones are seen on the CT, but no ureteral stones or hydronephrosis is noted.  Patient with perinephric stranding over the lower pole of the right kidney is noted.  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 on daily antibix treatment    Surgical History: Past Surgical History:  Procedure Laterality Date   BLADDER STONE REMOVAL  2018   CERVICAL CONE BIOPSY  2018   CHOLECYSTECTOMY     GASTRIC ROUX-EN-Y  2013   KYPHOPLASTY N/A 12/07/2020   Procedure: L2 Kyphoplasty;  Surgeon: Natasha Knows, MD;  Location: [facility] ORS;  Service: Orthopedics;  Laterality: N/A;   ROTATOR CUFF  REPAIR Left 2010   SHOULDER ARTHROSCOPY WITH SUBACROMIAL DECOMPRESSION AND OPEN ROTATOR C Left 10/19/2020   Procedure: LEFT SHOULDER ARTHROSCOPY WITH DEBRIDEMENT DECOMPRESSION AND POSSIBLE BICEP TENODESIS;  Surgeon: Natasha Mull, MD;  Location: [facility] ORS;  Service: Orthopedics;  Laterality: Left;   transforaminal epidural steroid injection Left 10/01/2020   C6-7    Home Medications:  Allergies as of 11/11/2021       Reactions   Cefuroxime Shortness Of Breath   Tolerates Augmentin per husband Natasha Estrada   Sulfa Antibiotics Rash        Medication List        Accurate as of  November 11, 2021 11:59 PM. If you have any questions, ask your nurse or doctor.          acetaminophen 325 MG tablet Commonly known as: TYLENOL Take 2 tablets (650 mg total) by mouth every 6 (six) hours as needed for mild pain (or Fever >/= 101).   buPROPion 150 MG 24 hr tablet Commonly known as: WELLBUTRIN XL Take 150 mg by mouth daily.   busPIRone 15 MG tablet Commonly known as: BUSPAR Take 15 mg by mouth 2 (two) times daily.   cephALEXin 500 MG capsule Commonly known as: KEFLEX Take 1 capsule (500 mg total) by mouth 3 (three) times daily for 10 days. Started by: Natasha Council, PA-C   diphenhydrAMINE 50 MG/ML injection Commonly known as: BENADRYL Inject 0.5 mLs (25 mg total) into the vein once as needed for up to 8 days (if patient exhibits significant signs and symptoms of allergic reaction.).   DULoxetine 60 MG capsule Commonly known as: CYMBALTA Take 60 mg by mouth daily.   EPINEPHrine 0.3 mg/0.3 mL Soaj injection Commonly known as: EPI-PEN Inject 0.3 mg into the muscle once as needed (if patient exhibits significant signs and symptoms of allergic reaction.).   fluticasone 50 MCG/ACT nasal spray Commonly known as: FLONASE Place 1 spray into both nostrils 2 (two) times daily.   levonorgestrel 20 MCG/24HR IUD Commonly known as: MIRENA by Intrauterine route.   losartan 50 MG tablet Commonly known as: COZAAR Take 1 tablet by mouth daily.   propranolol 40 MG tablet Commonly known as: INDERAL Take 40 mg by mouth at bedtime.   pseudoephedrine 120 MG 12 hr tablet Commonly known as: SUDAFED Take 120 mg by mouth daily as needed for congestion.   Saxenda 18 MG/3ML Sopn Generic drug: Liraglutide -Weight Management Inject 3 mg into the skin daily.   zolpidem 5 MG tablet Commonly known as: AMBIEN Take 1 tablet (5 mg total) by mouth at bedtime.        Allergies:  Allergies  Allergen Reactions   Cefuroxime Shortness Of Breath    Tolerates Augmentin per  husband Natasha Estrada   Sulfa Antibiotics Rash    Family History: Family History  Problem Relation Age of Onset   Hypertension Mother     Social History:  reports that she has never smoked. She has never used smokeless tobacco. She reports that she does not currently use alcohol. She reports that she does not use drugs.   Physical Exam: BP (!) 88/62   Pulse 88   Temp 98.1 F (36.7 C) (Oral)   Ht 5\' 4"  (1.626 m)   Wt 145 lb (65.8 kg)   SpO2 98%   BMI 24.89 kg/m   Constitutional:  Well nourished. Alert and oriented, No acute distress. HEENT: [place] AT, mask in place.  Trachea midline Cardiovascular: No clubbing, cyanosis, or edema. Respiratory: Normal  respiratory effort, no increased work of breathing. GU: No CVA tenderness.  No bladder fullness or masses.  Neurologic: Grossly intact, no focal deficits, moving all 4 extremities. Psychiatric: Normal mood and affect.    Laboratory Data: Component     Latest Ref Rng & Units 10/17/2021  WBC     4.0 - 10.5 K/uL 13.6 (H)  RBC     3.87 - 5.11 MIL/uL 3.43 (L)  Hemoglobin     12.0 - 15.0 g/dL 9.5 (L)  HCT     36.0 - 46.0 % 29.7 (L)  MCV     80.0 - 100.0 fL 86.6  MCH     26.0 - 34.0 pg 27.7  MCHC     30.0 - 36.0 g/dL 32.0  RDW     11.5 - 15.5 % 13.8  Platelets     150 - 400 K/uL 199  nRBC     0.0 - 0.2 % 0.0  Neutrophils     %   NEUT#     1.7 - 7.7 K/uL   Lymphocytes     %   Lymphocyte #     0.7 - 4.0 K/uL   Monocytes Relative     %   Monocyte #     0.1 - 1.0 K/uL   Eosinophil     %   Eosinophils Absolute     0.0 - 0.5 K/uL   Basophil     %   Basophils Absolute     0.0 - 0.1 K/uL   Immature Granulocytes     %   Abs Immature Granulocytes     0.00 - 0.07 K/uL   WBC, UA     0 - 5 /hpf   Epithelial Cells (non renal)     0 - 10 /hpf   Casts     None seen /lpf   Cast Type     N/A   Bacteria, UA     None seen/Few   Crystals     N/A   Crystal Type     N/A   Renal Epithel, UA     None seen /hpf     Component     Latest Ref Rng & Units 10/16/2021  Sodium     135 - 145 mmol/L 136  Potassium     3.5 - 5.1 mmol/L 3.7  Chloride     98 - 111 mmol/L 105  CO2     22 - 32 mmol/L 23  Glucose     70 - 99 mg/dL 105 (H)  BUN     6 - 20 mg/dL 15  Creatinine     0.44 - 1.00 mg/dL 1.13 (H)  Calcium     8.9 - 10.3 mg/dL 8.4 (L)  GFR, Estimated     >60 mL/min 59 (L)  Anion gap     5 - 15 8   Procalcitonin ng/mL 2.79   Comment:         Interpretation:  PCT > 2 ng/mL:  Systemic infection (sepsis) is likely,  unless other causes are known.  (NOTE)        Sepsis PCT Algorithm           Lower Respiratory Tract                                       Infection PCT Algorithm     ----------------------------     ----------------------------  PCT < 0.25 ng/mL                PCT < 0.10 ng/mL           Strongly encourage             Strongly discourage    discontinuation of antibiotics    initiation of antibiotics     ----------------------------     -----------------------------        PCT 0.25 - 0.50 ng/mL            PCT 0.10 - 0.25 ng/mL                OR        >80% decrease in PCT            Discourage initiation of                                             antibiotics       Encourage discontinuation            of antibiotics     ----------------------------     -----------------------------          PCT >= 0.50 ng/mL              PCT 0.26 - 0.50 ng/mL                AND        <80% decrease in PCT             Encourage initiation of                                              antibiotics        Encourage continuation            of antibiotics     ----------------------------     -----------------------------         PCT >= 0.50 ng/mL                  PCT > 0.50 ng/mL                AND          increase in PCT                  Strongly encourage                                       initiation of antibiotics     Strongly encourage escalation            of  antibiotics                                      -----------------------------                                            PCT <= 0.25 ng/mL  OR                                         > 80% decrease in PCT                                       Discontinue / Do not initiate                                              antibiotics   Performed at [facility], [address] [place],  [place] [address]   Resulting Agency  [facility]         Specimen Collected: 10/16/21 04:36 Last Resulted: 10/16/21 06:06         Component     Latest Ref Rng & Units 11/11/2021        11:45 AM  WBC     4.0 - 10.5 K/uL 25.4 (H)  RBC     3.87 - 5.11 MIL/uL 4.24  Hemoglobin     12.0 - 15.0 g/dL 11.9 (L)  HCT     36.0 - 46.0 % 37.2  MCV     80.0 - 100.0 fL 87.7  MCH     26.0 - 34.0 pg 28.1  MCHC     30.0 - 36.0 g/dL 32.0  RDW     11.5 - 15.5 % 13.8  Platelets     150 - 400 K/uL 196  nRBC     0.0 - 0.2 % 0.0  Neutrophils     % 90  NEUT#     1.7 - 7.7 K/uL 22.7 (H)  Lymphocytes     % 3  Lymphocyte #     0.7 - 4.0 K/uL 0.8  Monocytes Relative     % 6  Monocyte #     0.1 - 1.0 K/uL 1.5 (H)  Eosinophil     % 0  Eosinophils Absolute     0.0 - 0.5 K/uL 0.0  Basophil     % 0  Basophils Absolute     0.0 - 0.1 K/uL 0.1  WBC Morphology      TOXIC GRANULATION  RBC Morphology      MORPHOLOGY UNREMARKABLE  Smear Review      MORPHOLOGY UNREMARKABLE  Immature Granulocytes     % 1  Abs Immature Granulocytes     0.00 - 0.07 K/uL 0.35 (H)  WBC, UA     0 - 5 /hpf   Epithelial Cells (non renal)     0 - 10 /hpf   Casts     None seen /lpf   Cast Type     N/A   Bacteria, UA     None seen/Few   Crystals     N/A   Crystal Type     N/A   Renal Epithel, UA     None seen /hpf    Component     Latest Ref Rng & Units 11/11/2021  Lactic Acid, Venous     0.5 - 1.9 mmol/L 1.7   Component     Latest Ref Rng & Units  11/11/2021  Sodium     135 -  145 mmol/L 135  Potassium     3.5 - 5.1 mmol/L 4.2  Chloride     98 - 111 mmol/L 103  CO2     22 - 32 mmol/L 25  Glucose     70 - 99 mg/dL 158 (H)  BUN     6 - 20 mg/dL 25 (H)  Creatinine     0.44 - 1.00 mg/dL 1.69 (H)  Calcium     8.9 - 10.3 mg/dL 8.8 (L)  GFR, Estimated     >60 mL/min 36 (L)  Anion gap     5 - 15 7   Urinalysis Component     Latest Ref Rng & Units 11/11/2021  Specific Gravity, UA     1.005 - 1.030 1.015  pH, UA     5.0 - 7.5 5.5  Color, UA     Yellow Yellow  Appearance Ur     Clear Cloudy (A)  Leukocytes,UA     Negative 2+ (A)  Protein,UA     Negative/Trace 2+ (A)  Glucose, UA     Negative Negative  Ketones, UA     Negative Negative  RBC, UA     Negative 2+ (A)  Bilirubin, UA     Negative Negative  Urobilinogen, Ur     0.2 - 1.0 mg/dL 0.2  Nitrite, UA     Negative Positive (A)  Microscopic Examination      See below:   Component     Latest Ref Rng & Units 11/11/2021         9:37 AM  WBC, UA     0 - 5 /hpf >30 (H)  RBC     3.87 - 5.11 MIL/uL 0-2  Epithelial Cells (non renal)     0 - 10 /hpf 0-10  Bacteria, UA     None seen/Few Many (A)  I have reviewed the labs.  Pertinent Imaging CLINICAL DATA:  Flank pain.  Evaluate for kidney stone.   EXAM: CT ABDOMEN AND PELVIS WITHOUT CONTRAST   TECHNIQUE: Multidetector CT imaging of the abdomen and pelvis was performed following the standard protocol without IV contrast.   COMPARISON:  08/19/2021   FINDINGS: Lower chest: No acute abnormality.   Hepatobiliary: Status post cholecystectomy. Increase caliber of the common bile duct measures up to 1 cm, similar to the previous exam. No intrahepatic bile duct dilatation.   Pancreas: Unremarkable. No pancreatic ductal dilatation or surrounding inflammatory changes.   Spleen: Normal in size without focal abnormality.   Adrenals/Urinary Tract: Normal adrenal glands. Similar appearance of sub capsular  calcifications overlying the lateral aspect of the interpolar right kidney which may be the sequelae of prior trauma. No right renal stones. Mild soft tissue stranding overlying the inferior pole of the right kidney is noted, new from the previous exam, image 100/5.   There are 2 stones within the upper pole collecting system of the left kidney. The largest measures 9 mm, image 27/2. These are unchanged compared with the previous exam.   No hydronephrosis identified bilaterally. No signs hydroureter or ureteral lithiasis.   Bladder appears unremarkable for degree of distension.   Stomach/Bowel: Postsurgical changes involving the stomach identified reflecting previous gastric bypass surgery. The appendix is visualized and appears normal. No signs of bowel wall thickening, inflammation or distension.   Vascular/Lymphatic: Normal appearance of the abdominal aorta. No abdominal or pelvic adenopathy.   Reproductive: IUD is identified within the uterus. No adnexal mass noted.   Other: No  free fluid or fluid collections.   Musculoskeletal: No acute or significant osseous findings. Status post vertebroplasty at the L2 vertebral body. Unchanged.   IMPRESSION: 1. No signs of hydronephrosis, hydroureter or ureteral calculi bilaterally. 2. There is mild soft tissue stranding overlying the inferior pole of the right kidney which may reflect underlying pyelonephritis. Clinical correlation advised. 3. Nonobstructing left renal calculi.     Electronically Signed   By: Kerby Moors M.D.   On: 11/11/2021 10:44  I have independently reviewed the films.  See HPI.    Assessment & Plan:    1. Recurrent UTI -Recently admitted for sepsis from acute pyelonephritis -UA suspicious for infection -Urine sent for culture -She is given 80 mg of gentamicin IM here in the office -She is started on Keflex 500 mg 3 times daily -Blood work is consistent with pyelonephritis  2. Left  nephrolithiasis -seen on recent CT -Non obstructing  3. Possible small proteinaceous/hemorrhagic cyst.   Renal mass protocol MRI scheduled for 10/2021  [facility] [address] [place], [place] [address] [phone]   [facility], PA-C  I spent 40 minutes on the day of the encounter to include pre-visit record review, face-to-face time with the patient, and post-visit ordering of tests.

## 2021-11-11 NOTE — Telephone Encounter (Signed)
She was treated for a uti and said it has not cleared up and she said she has a fever.  She is coming in today for an appointment.

## 2021-11-15 LAB — CULTURE, URINE COMPREHENSIVE

## 2021-11-21 ENCOUNTER — Telehealth: Payer: Self-pay | Admitting: Urology

## 2021-11-21 NOTE — Telephone Encounter (Signed)
She is feeling better and we will see her on 12/14/2021.

## 2021-11-24 ENCOUNTER — Other Ambulatory Visit: Payer: Self-pay | Admitting: Surgery

## 2021-11-29 ENCOUNTER — Other Ambulatory Visit
Admission: RE | Admit: 2021-11-29 | Discharge: 2021-11-29 | Disposition: A | Discharge status: Home / Self Care | Payer: Federal, State, Local not specified - PPO | Source: Ambulatory Visit | Attending: Surgery | Admitting: Surgery

## 2021-11-29 ENCOUNTER — Other Ambulatory Visit: Payer: Self-pay

## 2021-11-29 NOTE — Patient Instructions (Signed)
Your procedure is scheduled on: Thursday December 08, 2020. Report to Day Surgery inside [facility] 2nd floor, stop by admissions desk before getting on elevator. To find out your arrival time please call [phone] between 1PM - 3PM on Wednesday December 07, 2020.  Remember: Instructions that are not followed completely may result in serious medical risk,  up to and including death, or upon the discretion of your surgeon and anesthesiologist your  surgery may need to be rescheduled.     _X__ 1. Do not eat food after midnight the night before your procedure.                 No chewing gum or hard candies. You may drink clear liquids up to 2 hours                 before you are scheduled to arrive for your surgery- DO not drink clear                 liquids within 2 hours of the start of your surgery.                 Clear Liquids include:  water, apple juice without pulp, clear Gatorade, G2 or                  Gatorade Zero (avoid Red/Purple/Blue), Black Coffee or Tea (Do not add                 anything to coffee or tea).  __X__2.   Complete the "Ensure Clear Pre-surgery Clear Carbohydrate Drink" provided to you, 2 hours before arrival. **If you are diabetic you will be provided with an alternative drink, Gatorade Zero or G2.  __X__3.  On the morning of surgery brush your teeth with toothpaste and water, you                may rinse your mouth with mouthwash if you wish.  Do not swallow any toothpaste of mouthwash.     _X__ 4.  No Alcohol for 24 hours before or after surgery.   _X__ 5.  Do Not Smoke or use e-cigarettes For 24 Hours Prior to Your Surgery.                 Do not use any chewable tobacco products for at least 6 hours prior to                 Surgery.  _X__  6.  Do not use any recreational drugs (marijuana, cocaine, heroin, ecstasy, MDMA or other)                For at least one week prior to your surgery.  Combination of these drugs with  anesthesia                May have life threatening results.  __X__7.  Notify your doctor if there is any change in your medical condition      (cold, fever, infections).     Do not wear jewelry, make-up, hairpins, clips or nail polish. Do not wear lotions, powders, or perfumes deodorant. Do not shave 48 hours prior to surgery. Men may shave face and neck. Do not bring valuables to the hospital.    [facility] is not responsible for any belongings or valuables.  Contacts, dentures or bridgework may not be worn into surgery. Leave your suitcase in the car. After surgery it may be brought to  your room. For patients admitted to the hospital, discharge time is determined by your treatment team.   Patients discharged the day of surgery will not be allowed to drive home.   Make arrangements for someone to be with you for the first 24 hours of your Same Day Discharge.   __X__ Take these medicines the morning of surgery with A SIP OF WATER:    1. buPROPion (WELLBUTRIN XL) 150 MG 24  2. busPIRone (BUSPAR) 15 MG   3. DULoxetine (CYMBALTA) 60 MG  4. omeprazole (PRILOSEC) 20 MG  5. trimethoprim (TRIMPEX) 100 MG  6.  ____ Fleet Enema (as directed)   __X__ Use CHG Soap (or wipes) as directed  ____ Use Benzoyl Peroxide Gel as instructed  ____ Use inhalers on the day of surgery  ____ Stop metformin 2 days prior to surgery    ____ Take 1/2 of usual insulin dose the night before surgery. No insulin the morning          of surgery.   ____ Call your PCP, cardiologist, or Pulmonologist if taking Coumadin/Plavix/aspirin and ask when to stop before your surgery.   __X__ One Week prior to surgery- Stop Anti-inflammatories such as Ibuprofen, Aleve, Advil, Motrin, meloxicam (MOBIC), diclofenac, etodolac, ketorolac, Toradol, Daypro, piroxicam, Goody's or BC powders. OK TO USE TYLENOL IF NEEDED   __X__ Stop supplements until after surgery.    ____ Bring C-Pap to the hospital.    If you have  any questions regarding your pre-procedure instructions,  Please call Pre-admit Testing at [phone]

## 2021-11-29 NOTE — Patient Instructions (Signed)
Your procedure is scheduled on: Thursday December 08, 2020. Report to Day Surgery inside [facility] 2nd floor, stop by admissions desk before getting on elevator. To find out your arrival time please call [phone] between 1PM - 3PM on Wednesday December 07, 2020.  Remember: Instructions that are not followed completely may result in serious medical risk,  up to and including death, or upon the discretion of your surgeon and anesthesiologist your  surgery may need to be rescheduled.     _X__ 1. Do not eat food after midnight the night before your procedure.                 No chewing gum or hard candies. You may drink clear liquids up to 2 hours                 before you are scheduled to arrive for your surgery- DO not drink clear                 liquids within 2 hours of the start of your surgery.                 Clear Liquids include:  water, apple juice without pulp, clear Gatorade, G2 or                  Gatorade Zero (avoid Red/Purple/Blue), Black Coffee or Tea (Do not add                 anything to coffee or tea).  __X__2.   Complete the "Ensure Clear Pre-surgery Clear Carbohydrate Drink" provided to you, 2 hours before arrival. **If you are diabetic you will be provided with an alternative drink, Gatorade Zero or G2.  __X__3.  On the morning of surgery brush your teeth with toothpaste and water, you                may rinse your mouth with mouthwash if you wish.  Do not swallow any toothpaste of mouthwash.     _X__ 4.  No Alcohol for 24 hours before or after surgery.   _X__ 5.  Do Not Smoke or use e-cigarettes For 24 Hours Prior to Your Surgery.                 Do not use any chewable tobacco products for at least 6 hours prior to                 Surgery.  _X__  6.  Do not use any recreational drugs (marijuana, cocaine, heroin, ecstasy, MDMA or other)                For at least one week prior to your surgery.  Combination of these drugs with anesthesia                 May have life threatening results.  __X__7.  Notify your doctor if there is any change in your medical condition      (cold, fever, infections).     Do not wear jewelry, make-up, hairpins, clips or nail polish. Do not wear lotions, powders, or perfumes deodorant. Do not shave 48 hours prior to surgery. Men may shave face and neck. Do not bring valuables to the hospital.    [facility] is not responsible for any belongings or valuables.  Contacts, dentures or bridgework may not be worn into surgery. Leave your suitcase in the car. After surgery it may be brought to  your room. For patients admitted to the hospital, discharge time is determined by your treatment team.   Patients discharged the day of surgery will not be allowed to drive home.   Make arrangements for someone to be with you for the first 24 hours of your Same Day Discharge.   __X__ Take these medicines the morning of surgery with A SIP OF WATER:    1. buPROPion (WELLBUTRIN XL) 150 MG 24  2. busPIRone (BUSPAR) 15 MG   3. DULoxetine (CYMBALTA) 60 MG  4. omeprazole (PRILOSEC) 20 MG  5. trimethoprim (TRIMPEX) 100 MG  6.  ____ Fleet Enema (as directed)   __X__ Use CHG Soap (or wipes) as directed  ____ Use Benzoyl Peroxide Gel as instructed  ____ Use inhalers on the day of surgery  ____ Stop metformin 2 days prior to surgery    ____ Take 1/2 of usual insulin dose the night before surgery. No insulin the morning          of surgery.   ____ Call your PCP, cardiologist, or Pulmonologist if taking Coumadin/Plavix/aspirin and ask when to stop before your surgery.   __X__ One Week prior to surgery- Stop Anti-inflammatories such as Ibuprofen, Aleve, Advil, Motrin, meloxicam (MOBIC), diclofenac, etodolac, ketorolac, Toradol, Daypro, piroxicam, Goody's or BC powders. OK TO USE TYLENOL IF NEEDED   __X__ Stop supplements until after surgery.    ____ Bring C-Pap to the hospital.    If you have any  questions regarding your pre-procedure instructions,  Please call Pre-admit Testing at [phone]

## 2021-11-30 ENCOUNTER — Inpatient Hospital Stay
Admission: RE | Admit: 2021-11-30 | Discharge: 2021-11-30 | Disposition: A | Discharge status: Home / Self Care | Payer: Federal, State, Local not specified - PPO | Source: Ambulatory Visit

## 2021-12-01 NOTE — Progress Notes (Signed)
Perioperative Services Pre-Admission/Anesthesia Testing   Date: 12/01/21 Name: Natasha Estrada MRN:   [id number]  Re: Consideration of preoperative prophylactic antibiotic change   Request sent to: Poggi, Marshall Cork, MD (routed and/or faxed via [facility])  Planned Surgical Procedure(s):    Case: [id number] Date/Time: 12/08/21 1019   Procedure: Left shoulder arthroscopy with debridement, decompression, and possible rotator cuff repair (Left: Shoulder)   Anesthesia type: Choice   Pre-op diagnosis: Primary osteoarthritis of left shoulder   Location: [facility] OR ROOM 03 / [place] ORS FOR ANESTHESIA GROUP   Surgeons: Corky Mull, MD   Clinical Notes:  Patient has a documented allergy to CEFUROXIME  Advising that this medication caused her to experience SOB in the past.   Received PCN/cephalosporin with no documented complications Per report, has tolerated Augmentin without ADRs.  CEPHALEXIN courses received on 10/17/2021 and 11/11/2021 - tolerated well.   Screened as appropriate for cephalosporin use during medication reconciliation No immediate angioedema, dysphagia, SOB, anaphylaxis symptoms. No severe rash involving mucous membranes or skin necrosis. No hospital admissions related to side effects of PCN/cephalosporin use.  No documented reaction to PCN or cephalosporin in the last 10 years.  Request:  As an evidence based approach to reducing the rate of incidence for post-operative SSI and the development of MDROs, could an agent with narrower coverage for preoperative prophylaxis in this patient's upcoming surgical course be considered?   Currently ordered preoperative prophylactic ABX: clindamycin.   Specifically requesting change to cephalosporin (CEFAZOLIN).   Please communicate decision with me and I will change the orders in Epic as per your direction.   Things to consider: Many patients report that they were "allergic" to PCN earlier in life, however this does not translate into a true  lifelong allergy. Patients can lose sensitivity to specific IgE antibodies over time if PCN is avoided (Kleris & Lugar, 2019).  Up to 10% of the adult population and 15% of hospitalized patients report an allergy to PCN, however clinical studies suggest that 90% of those reporting an allergy can tolerate PCN antibiotics (Kleris & Lugar, 2019).  Cross-sensitivity between PCN and cephalosporins has been documented as being as high as 10%, however this estimation included data believed to have been collected in a setting where there was contamination. Newer data suggests that the prevalence of cross-sensitivity between PCN and cephalosporins is actually estimated to be closer to 1% (Hermanides et al., 2018).   Patients labeled as PCN allergic, whether they are truly allergic or not, have been found to have inferior outcomes in terms of rates of serious infection, and these patients tend to have longer hospital stays (Sabina, 2019).  Treatment related secondary infections, such as Clostridioides difficile, have been linked to the improper use of broad spectrum antibiotics in patients improperly labeled as PCN allergic (Kleris & Lugar, 2019).  Anaphylaxis from cephalosporins is rare and the evidence suggests that there is no increased risk of an anaphylactic type reaction when cephalosporins are used in a PCN allergic patient (Pichichero, 2006).  Citations: Hermanides J, Lemkes BA, Prins Pearla Dubonnet MW, Terreehorst I. Presumed ?-Lactam Allergy and Cross-reactivity in the Operating Theater: A Practical Approach. Anesthesiology. 2018 Aug;129(2):335-342. doi: 10.1097/ALN.0000000000002252. PMID: 92119417.  Kleris, Varina., & Lugar, P. L. (2019). Things We Do For No Reason: Failing to Question a Penicillin Allergy History. Journal of hospital medicine, 14(10), 7375983722. Advance online publication. https://www.wallace-middleton.info/  Pichichero, M. E. (2006). Cephalosporins can be prescribed safely for  penicillin-allergic patients. Journal of family medicine, 55(2), 106-112.  Accessed: https://cdn.mdedge.com/files/s71fs-public/Document/September-2017/5502JFP_AppliedEvidence1.pdf   Honor Loh, MSN, APRN, FNP-C, CEN [facility]  Peri-operative Services Nurse Practitioner FAX: [phone] 12/01/21 10:07 AM

## 2021-12-06 NOTE — Progress Notes (Signed)
°  Perioperative Services Pre-Admission/Anesthesia Testing     Date: 12/06/21  Name: Natasha Estrada MRN:   [id number]  Re: Change in [place] for upcoming surgery   Case: [id number] Date/Time: 12/08/21 1019   Procedure: Left shoulder arthroscopy with debridement, decompression, and possible rotator cuff repair (Left: Shoulder)   Anesthesia type: Choice   Pre-op diagnosis: Primary osteoarthritis of left shoulder   Location: [facility] OR ROOM 03 / [place] ORS FOR ANESTHESIA GROUP   Surgeons: Corky Mull, MD   Primary attending surgeon was consulted regarding consideration of therapeutic change in antimicrobial agent being used for preoperative prophylaxis in this patient's upcoming surgical case. Following analysis of the risk versus benefits, the patient's primary attending surgeon advised that it would be acceptable to discontinue the ordered clindamycin and place an order for cefazolin 2 gm IV on call to the OR. Orders for this patient were amended by me following collaborative conversation with attending surgeon taking into consideration of risk versus benefits associated with the change in therapy.  Honor Loh, MSN, APRN, FNP-C, CEN [facility]  Peri-operative Services Nurse Practitioner Phone: [phone] 12/06/21 8:36 AM

## 2021-12-08 ENCOUNTER — Other Ambulatory Visit: Payer: Self-pay

## 2021-12-08 ENCOUNTER — Ambulatory Visit: Admitting: Urgent Care

## 2021-12-08 ENCOUNTER — Encounter: Payer: Self-pay | Admitting: Surgery

## 2021-12-08 ENCOUNTER — Encounter
Admission: RE | Disposition: A | Discharge status: Home / Self Care | Payer: Self-pay | Source: Home / Self Care | Attending: Surgery

## 2021-12-08 ENCOUNTER — Ambulatory Visit
Admission: RE | Admit: 2021-12-08 | Discharge: 2021-12-08 | Disposition: A | Discharge status: Home / Self Care | Attending: Surgery | Admitting: Surgery

## 2021-12-08 ENCOUNTER — Ambulatory Visit

## 2021-12-08 DIAGNOSIS — M545 Low back pain, unspecified: Secondary | ICD-10-CM | POA: Diagnosis not present

## 2021-12-08 DIAGNOSIS — M659 Synovitis and tenosynovitis, unspecified: Secondary | ICD-10-CM | POA: Insufficient documentation

## 2021-12-08 DIAGNOSIS — M25812 Other specified joint disorders, left shoulder: Secondary | ICD-10-CM | POA: Insufficient documentation

## 2021-12-08 DIAGNOSIS — G8929 Other chronic pain: Secondary | ICD-10-CM | POA: Diagnosis not present

## 2021-12-08 DIAGNOSIS — G4730 Sleep apnea, unspecified: Secondary | ICD-10-CM | POA: Diagnosis not present

## 2021-12-08 DIAGNOSIS — I10 Essential (primary) hypertension: Secondary | ICD-10-CM | POA: Diagnosis not present

## 2021-12-08 DIAGNOSIS — G8918 Other acute postprocedural pain: Secondary | ICD-10-CM

## 2021-12-08 DIAGNOSIS — M19012 Primary osteoarthritis, left shoulder: Secondary | ICD-10-CM | POA: Diagnosis not present

## 2021-12-08 DIAGNOSIS — M75112 Incomplete rotator cuff tear or rupture of left shoulder, not specified as traumatic: Secondary | ICD-10-CM | POA: Diagnosis present

## 2021-12-08 DIAGNOSIS — F32A Depression, unspecified: Secondary | ICD-10-CM | POA: Insufficient documentation

## 2021-12-08 DIAGNOSIS — Z9884 Bariatric surgery status: Secondary | ICD-10-CM | POA: Diagnosis not present

## 2021-12-08 DIAGNOSIS — F419 Anxiety disorder, unspecified: Secondary | ICD-10-CM | POA: Diagnosis not present

## 2021-12-08 DIAGNOSIS — K219 Gastro-esophageal reflux disease without esophagitis: Secondary | ICD-10-CM | POA: Diagnosis not present

## 2021-12-08 HISTORY — PX: SHOULDER ARTHROSCOPY WITH SUBACROMIAL DECOMPRESSION AND OPEN ROTATOR C: SHX5688

## 2021-12-08 LAB — POCT PREGNANCY, URINE: Preg Test, Ur: NEGATIVE

## 2021-12-08 SURGERY — SHOULDER ARTHROSCOPY WITH SUBACROMIAL DECOMPRESSION AND OPEN ROTATOR CUFF REPAIR, OPEN BICEPS TENDON REPAIR
Anesthesia: Regional | Site: Shoulder | Laterality: Left

## 2021-12-08 MED ORDER — CEFAZOLIN SODIUM-DEXTROSE 2-4 GM/100ML-% IV SOLN
2.0000 g | Freq: Once | INTRAVENOUS | Status: AC
  Administered 2021-12-08: 2 g via INTRAVENOUS

## 2021-12-08 MED ORDER — DEXAMETHASONE SODIUM PHOSPHATE 10 MG/ML IJ SOLN
INTRAMUSCULAR | Status: DC | PRN
  Administered 2021-12-08: 10 mg via INTRAVENOUS

## 2021-12-08 MED ORDER — MIDAZOLAM HCL 2 MG/2ML IJ SOLN
1.0000 mg | INTRAMUSCULAR | Status: AC | PRN
  Administered 2021-12-08: 1 mg via INTRAVENOUS

## 2021-12-08 MED ORDER — FENTANYL CITRATE PF 50 MCG/ML IJ SOSY: 50.0000 ug | PREFILLED_SYRINGE | INTRAMUSCULAR | Status: DC | PRN

## 2021-12-08 MED ORDER — ROCURONIUM BROMIDE 10 MG/ML (PF) SYRINGE
PREFILLED_SYRINGE | INTRAVENOUS | Status: AC
  Filled 2021-12-08: qty 10

## 2021-12-08 MED ORDER — BUPIVACAINE LIPOSOME 1.3 % IJ SUSP
INTRAMUSCULAR | Status: AC
  Filled 2021-12-08: qty 10

## 2021-12-08 MED ORDER — BUPIVACAINE LIPOSOME 1.3 % IJ SUSP
INTRAMUSCULAR | Status: DC | PRN
  Administered 2021-12-08: 20 mL

## 2021-12-08 MED ORDER — FENTANYL CITRATE PF 50 MCG/ML IJ SOSY
PREFILLED_SYRINGE | INTRAMUSCULAR | Status: AC
  Administered 2021-12-08: 50 ug via INTRAVENOUS
  Filled 2021-12-08: qty 1

## 2021-12-08 MED ORDER — ONDANSETRON HCL 4 MG/2ML IJ SOLN
INTRAMUSCULAR | Status: DC | PRN
  Administered 2021-12-08: 4 mg via INTRAVENOUS

## 2021-12-08 MED ORDER — LACTATED RINGERS IV SOLN: INTRAVENOUS | Status: DC

## 2021-12-08 MED ORDER — BUPIVACAINE HCL (PF) 0.5 % IJ SOLN
INTRAMUSCULAR | Status: AC
  Filled 2021-12-08: qty 10

## 2021-12-08 MED ORDER — CEFAZOLIN SODIUM-DEXTROSE 2-4 GM/100ML-% IV SOLN
INTRAVENOUS | Status: AC
  Filled 2021-12-08: qty 100

## 2021-12-08 MED ORDER — ORAL CARE MOUTH RINSE: 15.0000 mL | Freq: Once | OROMUCOSAL | Status: AC

## 2021-12-08 MED ORDER — CHLORHEXIDINE GLUCONATE 0.12 % MT SOLN
OROMUCOSAL | Status: AC
  Administered 2021-12-08: 15 mL via OROMUCOSAL
  Filled 2021-12-08: qty 15

## 2021-12-08 MED ORDER — PHENYLEPHRINE HCL-NACL 20-0.9 MG/250ML-% IV SOLN
INTRAVENOUS | Status: DC | PRN
  Administered 2021-12-08: 50 ug/min via INTRAVENOUS

## 2021-12-08 MED ORDER — ONDANSETRON HCL 4 MG/2ML IJ SOLN: 4.0000 mg | Freq: Four times a day (QID) | INTRAMUSCULAR | Status: DC | PRN

## 2021-12-08 MED ORDER — OXYCODONE HCL 5 MG PO TABS: 5.0000 mg | ORAL_TABLET | Freq: Once | ORAL | Status: DC | PRN

## 2021-12-08 MED ORDER — OXYCODONE HCL 5 MG PO TABS: 5.0000 mg | ORAL_TABLET | ORAL | 0 refills | Status: DC | PRN

## 2021-12-08 MED ORDER — ONDANSETRON HCL 4 MG/2ML IJ SOLN
INTRAMUSCULAR | Status: AC
  Filled 2021-12-08: qty 2

## 2021-12-08 MED ORDER — BUPIVACAINE-EPINEPHRINE (PF) 0.5% -1:200000 IJ SOLN
INTRAMUSCULAR | Status: AC
  Filled 2021-12-08: qty 30

## 2021-12-08 MED ORDER — SODIUM CHLORIDE 0.9 % IV SOLN: INTRAVENOUS | Status: DC

## 2021-12-08 MED ORDER — EPHEDRINE 5 MG/ML INJ
INTRAVENOUS | Status: AC
  Filled 2021-12-08: qty 5

## 2021-12-08 MED ORDER — BUPIVACAINE-EPINEPHRINE (PF) 0.5% -1:200000 IJ SOLN
INTRAMUSCULAR | Status: DC | PRN
  Administered 2021-12-08: 30 mL

## 2021-12-08 MED ORDER — GLYCOPYRROLATE 0.2 MG/ML IJ SOLN
INTRAMUSCULAR | Status: AC
  Filled 2021-12-08: qty 1

## 2021-12-08 MED ORDER — OXYCODONE HCL 5 MG/5ML PO SOLN: 5.0000 mg | Freq: Once | ORAL | Status: DC | PRN

## 2021-12-08 MED ORDER — PHENYLEPHRINE HCL (PRESSORS) 10 MG/ML IV SOLN: INTRAVENOUS | Status: DC | PRN

## 2021-12-08 MED ORDER — ROCURONIUM BROMIDE 100 MG/10ML IV SOLN
INTRAVENOUS | Status: DC | PRN
  Administered 2021-12-08: 50 mg via INTRAVENOUS

## 2021-12-08 MED ORDER — EPHEDRINE SULFATE 50 MG/ML IJ SOLN
INTRAMUSCULAR | Status: DC | PRN
Start: 2021-12-08 — End: 2021-12-08
  Administered 2021-12-08 (×2): 5 mg via INTRAVENOUS

## 2021-12-08 MED ORDER — PROPOFOL 10 MG/ML IV BOLUS
INTRAVENOUS | Status: AC
  Filled 2021-12-08: qty 20

## 2021-12-08 MED ORDER — ONDANSETRON HCL 4 MG/2ML IJ SOLN: 4.0000 mg | Freq: Once | INTRAMUSCULAR | Status: DC | PRN

## 2021-12-08 MED ORDER — FENTANYL CITRATE PF 50 MCG/ML IJ SOSY
PREFILLED_SYRINGE | INTRAMUSCULAR | Status: AC
  Filled 2021-12-08: qty 1

## 2021-12-08 MED ORDER — FENTANYL CITRATE (PF) 100 MCG/2ML IJ SOLN: 25.0000 ug | INTRAMUSCULAR | Status: DC | PRN

## 2021-12-08 MED ORDER — ACETAMINOPHEN 10 MG/ML IV SOLN: 1000.0000 mg | Freq: Once | INTRAVENOUS | Status: DC | PRN

## 2021-12-08 MED ORDER — METOCLOPRAMIDE HCL 10 MG PO TABS: 5.0000 mg | ORAL_TABLET | Freq: Three times a day (TID) | ORAL | Status: DC | PRN

## 2021-12-08 MED ORDER — LACTATED RINGERS IR SOLN: Status: DC | PRN

## 2021-12-08 MED ORDER — FENTANYL CITRATE (PF) 100 MCG/2ML IJ SOLN
INTRAMUSCULAR | Status: AC
  Filled 2021-12-08: qty 2

## 2021-12-08 MED ORDER — PROPOFOL 10 MG/ML IV BOLUS
INTRAVENOUS | Status: DC | PRN
  Administered 2021-12-08: 150 mg via INTRAVENOUS

## 2021-12-08 MED ORDER — EPINEPHRINE PF 1 MG/ML IJ SOLN
INTRAMUSCULAR | Status: AC
  Filled 2021-12-08: qty 4

## 2021-12-08 MED ORDER — BUPIVACAINE HCL (PF) 0.5 % IJ SOLN
INTRAMUSCULAR | Status: DC | PRN
  Administered 2021-12-08: 10 mL

## 2021-12-08 MED ORDER — SUGAMMADEX SODIUM 200 MG/2ML IV SOLN
INTRAVENOUS | Status: DC | PRN
  Administered 2021-12-08: 200 mg via INTRAVENOUS

## 2021-12-08 MED ORDER — ACETAMINOPHEN 10 MG/ML IV SOLN
INTRAVENOUS | Status: AC
  Filled 2021-12-08: qty 100

## 2021-12-08 MED ORDER — METOCLOPRAMIDE HCL 5 MG/ML IJ SOLN: 5.0000 mg | Freq: Three times a day (TID) | INTRAMUSCULAR | Status: DC | PRN

## 2021-12-08 MED ORDER — DEXAMETHASONE SODIUM PHOSPHATE 10 MG/ML IJ SOLN
INTRAMUSCULAR | Status: AC
  Filled 2021-12-08: qty 1

## 2021-12-08 MED ORDER — LIDOCAINE HCL (CARDIAC) PF 100 MG/5ML IV SOSY
PREFILLED_SYRINGE | INTRAVENOUS | Status: DC | PRN
Start: 2021-12-08 — End: 2021-12-08
  Administered 2021-12-08: 80 mg via INTRAVENOUS

## 2021-12-08 MED ORDER — MIDAZOLAM HCL 2 MG/2ML IJ SOLN
INTRAMUSCULAR | Status: AC
  Administered 2021-12-08: 1 mg via INTRAVENOUS
  Filled 2021-12-08: qty 2

## 2021-12-08 MED ORDER — OXYCODONE HCL 5 MG PO TABS: 5.0000 mg | ORAL_TABLET | ORAL | Status: DC | PRN

## 2021-12-08 MED ORDER — LACTATED RINGERS IR SOLN
Status: DC | PRN
  Administered 2021-12-08: 1 mL

## 2021-12-08 MED ORDER — ONDANSETRON HCL 4 MG PO TABS: 4.0000 mg | ORAL_TABLET | Freq: Four times a day (QID) | ORAL | Status: DC | PRN

## 2021-12-08 MED ORDER — FENTANYL CITRATE (PF) 100 MCG/2ML IJ SOLN
INTRAMUSCULAR | Status: DC | PRN
  Administered 2021-12-08: 50 ug via INTRAVENOUS

## 2021-12-08 MED ORDER — PHENYLEPHRINE 40 MCG/ML (10ML) SYRINGE FOR IV PUSH (FOR BLOOD PRESSURE SUPPORT)
PREFILLED_SYRINGE | INTRAVENOUS | Status: DC | PRN
  Administered 2021-12-08: 100 ug via INTRAVENOUS

## 2021-12-08 MED ORDER — ACETAMINOPHEN 10 MG/ML IV SOLN
INTRAVENOUS | Status: DC | PRN
Start: 2021-12-08 — End: 2021-12-08
  Administered 2021-12-08: 1000 mg via INTRAVENOUS

## 2021-12-08 MED ORDER — CHLORHEXIDINE GLUCONATE 0.12 % MT SOLN: 15.0000 mL | Freq: Once | OROMUCOSAL | Status: AC

## 2021-12-08 MED ORDER — ZOLPIDEM TARTRATE 5 MG PO TABS: 10.0000 mg | ORAL_TABLET | Freq: Every day | ORAL | Status: DC

## 2021-12-08 MED ORDER — MIDAZOLAM HCL 2 MG/2ML IJ SOLN
INTRAMUSCULAR | Status: AC
  Filled 2021-12-08: qty 2

## 2021-12-08 MED ORDER — LIDOCAINE HCL (PF) 2 % IJ SOLN
INTRAMUSCULAR | Status: AC
  Filled 2021-12-08: qty 5

## 2021-12-08 SURGICAL SUPPLY — 49 items
ANCH SUT 2 2.9 2 LD TPR NDL (Anchor) ×1 IMPLANT
ANCHOR JUGGERKNOT WTAP NDL 2.9 (Anchor) ×1 IMPLANT
ANCHOR SUT QUATTRO KNTLS 4.5 (Anchor) IMPLANT
ANCHOR SUT W/ ORTHOCORD (Anchor) IMPLANT
APL PRP STRL LF DISP 70% ISPRP (MISCELLANEOUS) ×2
BIT DRILL JUGRKNT W/NDL BIT2.9 (DRILL) IMPLANT
BLADE FULL RADIUS 3.5 (BLADE) ×2 IMPLANT
BUR ACROMIONIZER 4.0 (BURR) ×2 IMPLANT
CANNULA SHAVER 8MMX76MM (CANNULA) ×2 IMPLANT
CANNULA THREADED FLEX 8X72 (CANNULA) ×1 IMPLANT
CHLORAPREP W/TINT 26 (MISCELLANEOUS) ×3 IMPLANT
COVER MAYO STAND REUSABLE (DRAPES) ×2 IMPLANT
DRAPE IMP U-DRAPE 54X76 (DRAPES) ×4 IMPLANT
DRILL JUGGERKNOT W/NDL BIT 2.9 (DRILL) ×4
ELECT REM PT RETURN 9FT ADLT (ELECTROSURGICAL) ×2
ELECTRODE REM PT RTRN 9FT ADLT (ELECTROSURGICAL) ×1 IMPLANT
GAUZE SPONGE 4X4 12PLY STRL (GAUZE/BANDAGES/DRESSINGS) ×2 IMPLANT
GAUZE XEROFORM 1X8 LF (GAUZE/BANDAGES/DRESSINGS) ×2 IMPLANT
GLOVE SRG 8 PF TXTR STRL LF DI (GLOVE) ×1 IMPLANT
GLOVE SURG ENC MOIS LTX SZ7.5 (GLOVE) ×4 IMPLANT
GLOVE SURG ENC MOIS LTX SZ8 (GLOVE) ×4 IMPLANT
GLOVE SURG UNDER LTX SZ8 (GLOVE) ×2 IMPLANT
GLOVE SURG UNDER POLY LF SZ8 (GLOVE) ×2
GOWN STRL REUS W/ TWL LRG LVL3 (GOWN DISPOSABLE) ×1 IMPLANT
GOWN STRL REUS W/ TWL XL LVL3 (GOWN DISPOSABLE) ×1 IMPLANT
GOWN STRL REUS W/TWL LRG LVL3 (GOWN DISPOSABLE) ×2
GOWN STRL REUS W/TWL XL LVL3 (GOWN DISPOSABLE) ×2
GRASPER SUT 15 45D LOW PRO (SUTURE) IMPLANT
IV LACTATED RINGER IRRG 3000ML (IV SOLUTION) ×2
IV LR IRRIG 3000ML ARTHROMATIC (IV SOLUTION) ×1 IMPLANT
MANIFOLD NEPTUNE II (INSTRUMENTS) ×3 IMPLANT
MASK FACE SPIDER DISP (MASK) ×2 IMPLANT
MAT ABSORB  FLUID 56X50 GRAY (MISCELLANEOUS) ×1
MAT ABSORB FLUID 56X50 GRAY (MISCELLANEOUS) ×1 IMPLANT
PACK ARTHROSCOPY SHOULDER (MISCELLANEOUS) ×2 IMPLANT
PENCIL SMOKE EVACUATOR (MISCELLANEOUS) ×2 IMPLANT
SLING ARM LRG DEEP (SOFTGOODS) ×2 IMPLANT
SLING ULTRA II LG (MISCELLANEOUS) ×2 IMPLANT
SPONGE T-LAP 18X18 ~~LOC~~+RFID (SPONGE) ×2 IMPLANT
STAPLER SKIN PROX 35W (STAPLE) ×2 IMPLANT
STRAP SAFETY 5IN WIDE (MISCELLANEOUS) ×2 IMPLANT
SUT ETHIBOND 0 MO6 C/R (SUTURE) ×2 IMPLANT
SUT VIC AB 2-0 CT1 27 (SUTURE) ×4
SUT VIC AB 2-0 CT1 TAPERPNT 27 (SUTURE) ×2 IMPLANT
TAPE MICROFOAM 4IN (TAPE) ×2 IMPLANT
TUBING CONNECTING 10 (TUBING) ×2 IMPLANT
TUBING INFLOW SET DBFLO PUMP (TUBING) ×2 IMPLANT
WAND WEREWOLF FLOW 90D (MISCELLANEOUS) ×2 IMPLANT
WATER STERILE IRR 500ML POUR (IV SOLUTION) ×2 IMPLANT

## 2021-12-08 NOTE — Anesthesia Procedure Notes (Signed)
Procedure Name: Intubation Date/Time: 12/08/2021 10:44 AM Performed by: Natasha Mead, CRNA Pre-anesthesia Checklist: Patient identified, Emergency Drugs available, Suction available and Patient being monitored Patient Re-evaluated:Patient Re-evaluated prior to induction Oxygen Delivery Method: Circle system utilized Preoxygenation: Pre-oxygenation with 100% oxygen Induction Type: IV induction Ventilation: Mask ventilation without difficulty Laryngoscope Size: McGraph and 3 Tube type: Oral Tube size: 6.5 mm Number of attempts: 1 Airway Equipment and Method: Stylet and Oral airway Placement Confirmation: ETT inserted through vocal cords under direct vision, positive ETCO2 and breath sounds checked- equal and bilateral Secured at: 19 cm Tube secured with: Tape Dental Injury: Teeth and Oropharynx as per pre-operative assessment

## 2021-12-08 NOTE — Anesthesia Preprocedure Evaluation (Addendum)
Anesthesia Evaluation  Patient identified by MRN, date of birth, ID band Patient awake    Reviewed: Allergy & Precautions, NPO status , Patient's Chart, lab work & pertinent test results  History of Anesthesia Complications Negative for: history of anesthetic complications  Airway Mallampati: IV   Neck ROM: Full    Dental  (+)    Pulmonary sleep apnea ,    Pulmonary exam normal breath sounds clear to auscultation       Cardiovascular hypertension, Normal cardiovascular exam Rhythm:Regular Rate:Normal  ECG 10/18/21: NSR, low voltage   Neuro/Psych PSYCHIATRIC DISORDERS Anxiety Depression Chronic lower back pain    GI/Hepatic GERD  ,S/p roux-en-Y gastric bypass   Endo/Other  negative endocrine ROS  Renal/GU Renal disease (nephrolithiasis)     Musculoskeletal  (+) Arthritis ,   Abdominal   Peds  Hematology negative hematology ROS (+)   Anesthesia Other Findings   Reproductive/Obstetrics Uterine fibroids                            Anesthesia Physical Anesthesia Plan  ASA: 2  Anesthesia Plan: General and Regional   Post-op Pain Management: Regional block   Induction: Intravenous  PONV Risk Score and Plan: 3 and Ondansetron, Dexamethasone and Treatment may vary due to age or medical condition  Airway Management Planned: Oral ETT  Additional Equipment:   Intra-op Plan:   Post-operative Plan: Extubation in OR  Informed Consent: I have reviewed the patients History and Physical, chart, labs and discussed the procedure including the risks, benefits and alternatives for the proposed anesthesia with the patient or authorized representative who has indicated his/her understanding and acceptance.     Dental advisory given  Plan Discussed with: CRNA  Anesthesia Plan Comments: (Plan for preoperative interscalene nerve block and GETA.  Patient consented for risks of anesthesia  including but not limited to:  - adverse reactions to medications - damage to eyes, teeth, lips or other oral mucosa - nerve damage due to positioning  - sore throat or hoarseness - damage to heart, brain, nerves, lungs, other parts of body or loss of life  Informed patient about role of CRNA in peri- and intra-operative care.  Patient voiced understanding.)        Anesthesia Quick Evaluation

## 2021-12-08 NOTE — Anesthesia Postprocedure Evaluation (Signed)
Anesthesia Post Note  Patient: Natasha Estrada  Procedure(s) Performed: Left shoulder arthroscopy with debridement, decompression, and rotator cuff repair (Left: Shoulder)  Patient location during evaluation: PACU Anesthesia Type: General Level of consciousness: awake and alert Pain management: pain level controlled Vital Signs Assessment: post-procedure vital signs reviewed and stable Respiratory status: spontaneous breathing, nonlabored ventilation, respiratory function stable and patient connected to nasal cannula oxygen Cardiovascular status: blood pressure returned to baseline and stable Postop Assessment: no apparent nausea or vomiting Anesthetic complications: no   No notable events documented.   Last Vitals:  Vitals:   12/08/21 1245 12/08/21 1303  BP: (!) 129/94 138/84  Pulse: 76 72  Resp: 16 18  Temp: 36.6 C (!) 36.1 C  SpO2: 100% 100%    Last Pain:  Vitals:   12/08/21 1303  TempSrc: Tympanic  PainSc: 0-No pain                 Arita Miss

## 2021-12-08 NOTE — Discharge Instructions (Addendum)
Orthopedic discharge instructions: Keep dressing dry and intact.  May shower after dressing changed on post-op day #4 (Monday).  Cover staples with Band-Aids after drying off. Apply ice frequently to shoulder. Take oxycodone as prescribed when needed.  May supplement with ES Tylenol if necessary. Keep shoulder immobilizer on at all times except may remove for bathing purposes. Follow-up in 10-14 days or as scheduled.  AMBULATORY SURGERY  DISCHARGE INSTRUCTIONS   The drugs that you were given will stay in your system until tomorrow so for the next 24 hours you should not:  Drive an automobile Make any legal decisions Drink any alcoholic beverage   You may resume regular meals tomorrow.  Today it is better to start with liquids and gradually work up to solid foods.  You may eat anything you prefer, but it is better to start with liquids, then soup and crackers, and gradually work up to solid foods.   Please notify your doctor immediately if you have any unusual bleeding, trouble breathing, redness and pain at the surgery site, drainage, fever, or pain not relieved by medication.    Additional Instructions:        Please contact your physician with any problems or Same Day Surgery at [phone], Monday through Friday 6 am to 4 pm, or [place] at [facility] number at [phone].

## 2021-12-08 NOTE — Transfer of Care (Signed)
Immediate Anesthesia Transfer of Care Note  Patient: Natasha Estrada  Procedure(s) Performed: Left shoulder arthroscopy with debridement, decompression, and rotator cuff repair (Left: Shoulder)  Patient Location: PACU  Anesthesia Type:General  Level of Consciousness: awake, alert  and oriented  Airway & Oxygen Therapy: Patient Spontanous Breathing and Patient connected to nasal cannula oxygen  Post-op Assessment: Report given to RN and Post -op Vital signs reviewed and stable  Post vital signs: stable  Last Vitals:  Vitals Value Taken Time  BP 126/63 12/08/21 1219  Temp    Pulse 80 12/08/21 1228  Resp 13 12/08/21 1228  SpO2 100 % 12/08/21 1228  Vitals shown include unvalidated device data.  Last Pain:  Vitals:   12/08/21 1020  TempSrc:   PainSc: 0-No pain         Complications: No notable events documented.

## 2021-12-08 NOTE — H&P (Signed)
History of Present Illness:  Natasha Estrada is a 51 y.o. female that presents to clinic today for her preoperative history and evaluation. Patient presents unaccompanied. Patient is previously 13 months status post a limited arthroscopic debridement, arthroscopic subacromial decompression, mini-open rotator cuff repair using a [employer] Regeneten patch, and a mini-open biceps tenodesis of the left shoulder. Patient continues to have moderate pain along the lateral aspect of the affected shoulder. Recent MRI of the shoulder showed small partial-thickness tear of the infraspinatus and supraspinatus tendons with some subacromial bursitis. The patient is scheduled to undergo a left shoulder arthroscopy with debridement, decompression, and possible repair of recurrent rotator cuff tear with Dr. Roland Rack on 12/08/2021.   The patient's symptoms have progressed to the point that they decrease her quality of life. The patient has previously undergone conservative treatment including NSAIDS and activity modification without adequate control of her symptoms.  Patient has received all necessary clearances for surgery.   Past Medical History:   Abnormal Pap smear 2007, 2019   Adenomyosis 2015   Allergy   Anemia   Anxiety   Arthritis   COVID-19   Depression   Dysmenorrhea   Fibroid   GERD (gastroesophageal reflux disease)   Hypertension   Insomnia   Kidney stone   Menorrhagia with regular cycle   Pneumonia   Recurrent nephrolithiasis   Sleep apnea   Past Surgical History:   TONSILLECTOMY 1980 (and adenoids)   ROUX-EN-Y PROCEDURE 2004 (Dr. Fatima Sanger at [facility])   [place] 2004   COLONOSCOPY 2005   GYNECOLOGIC CRYOSURGERY 2007   KIDNEY STONE SURGERY 2008   CHOLECYSTECTOMY 2010   SHOULDER ARTHROSCOPY W/ ROTATOR CUFF REPAIR 2010   MYOMECTOMY VAGINAL APPROACH 2011   PR CYSTO/URETERO W/LITHOTRIPSY &INDWELL STENT INSRT Right 10/15/2017 (Procedure: Cystoscopy/Right Ureteroscopy with Holmium  Laser-Basket Stone Extraction/Right Retrograde Pyelogram/Right JJ Stent; Surgeon: Edwyna Ready, MD; Location: [place]; Service: Urology; Laterality: Right)   INTRAOPERATIVE FLUOROSCOPY Right 10/15/2017  Procedure: FLUOROSCOPY; Surgeon: Edwyna Ready, MD; Location: [place]; Service: Urology; Laterality: Right;   Limited arthroscopic debridement, arthroscopic subacromial decompression, mini-open rotator cuff repair using a [employer]hew Regeneten patch, and mini-open biceps tenodesis, left shoulder. Left 10/19/2020 (Dr. Roland Rack)   KYPHOPLASTY 12/07/2020 (Dr. Rudene Christians - L2)   CERVICAL CONE BIOPSY   Current Medications:   acetaminophen (TYLENOL) 325 MG tablet Take 650 mg by mouth every 6 (six) hours as needed   alendronate (FOSAMAX) 70 MG tablet Take 1 tablet (70 mg total) by mouth every 7 (seven) days Take with a full glass of water. Do not lie down for the next 30 min. 12 tablet 3   atorvastatin (LIPITOR) 20 MG tablet Take 1 tablet (20 mg total) by mouth once daily 90 tablet 3   buPROPion (WELLBUTRIN XL) 150 MG XL tablet TAKE 1 TABLET(150 MG) BY MOUTH EVERY MORNING 90 tablet 1   busPIRone (BUSPAR) 15 MG tablet Take 1 tablet (15 mg total) by mouth 2 (two) times daily 180 tablet 1   cholecalciferol (VITAMIN D3) 1000 unit tablet Take 1,000 Units by mouth once daily   cyclobenzaprine (FLEXERIL) 10 MG tablet Take 10 mg by mouth 3 (three) times daily as needed   DULoxetine (CYMBALTA) 60 MG DR capsule TAKE 1 CAPSULE(60 MG) BY MOUTH EVERY DAY 90 capsule 1   fluticasone propionate (FLONASE) 50 mcg/actuation nasal spray SHAKE LIQUID AND USE 1 SPRAY IN EACH NOSTRIL TWICE DAILY 16 g 1   gabapentin (NEURONTIN) 600 MG tablet  Take 600 mg by mouth at bedtime   hydrOXYzine pamoate (VISTARIL) 25 MG capsule TAKE 1 CAPSULE(25 MG) BY MOUTH THREE TIMES DAILY AS NEEDED FOR ANXIETY 30 capsule 0   levonorgestrel (MIRENA 52 MG) 20 mcg/24 hr (5 years) IUD Insert 1 each into the uterus once Follow package  directions.   lidocaine (LIDODERM) 5 % patch APPLY 1 TO 2 PATCHES TOPICALLY TO THE SKIN EVERY DAY. MAY WEAR UP TO 12 HOURS   linaCLOtide (LINZESS) 145 mcg capsule Take 1 capsule (145 mcg total) by mouth once daily 90 capsule 1   liraglutide, weight loss, (SAXENDA) 3 mg/0.5 mL (18 mg/3 mL) pen injector Inject 3 mg subcutaneously once daily 15 mL 3   losartan (COZAAR) 50 MG tablet TAKE 1 TABLET(50 MG) BY MOUTH EVERY DAY 90 tablet 1   omeprazole (PRILOSEC) 20 MG DR capsule TAKE 1 CAPSULE(20 MG) BY MOUTH EVERY DAY 90 capsule 3   propranoloL (INDERAL) 40 MG tablet TAKE 1 TABLET(40 MG) BY MOUTH EVERY DAY 90 tablet 0   tiZANidine (ZANAFLEX) 4 MG tablet Take 4 mg by mouth every 8 (eight) hours as needed   trimethoprim 100 mg tablet Take 100 mg by mouth once daily   pen needle, diabetic (BD ULTRA-FINE SHORT PEN NEEDLE) 31 gauge x 5/16" needle Use as directed for up to 90 days 30 each 2   Allergies:   Cefuroxime Shortness Of Breath   Sulfa (Sulfonamide Antibiotics) Rash   Social History:   Socioeconomic History   Marital status: Married  Occupational History   Occupation: Therapist, sports [employer]  Tobacco Use   Smoking status: Never   Smokeless tobacco: Never  Vaping Use   Vaping Use: Never used  Substance and Sexual Activity   Alcohol use: Yes  Alcohol/week: 0.0 standard drinks  Comment: rarely   Drug use: No   Sexual activity: Yes  Partners: Male  Birth control/protection: I.U.D.   Family History:   High blood pressure (Hypertension) Mother   Thyroid disease Mother   Hyperlipidemia (Elevated cholesterol) Mother 40   Osteoporosis (Thinning of bones) Mother 86   Osteoarthritis Mother   Alzheimer's disease Father   Anxiety Father   Colon polyps Father   Stroke Father 28   Hyperlipidemia (Elevated cholesterol) Father 74   Diabetes Father   Heart disease Father   Parkinsonism Father   High blood pressure (Hypertension) Father   Dementia Father   Alzheimer's disease Maternal Uncle    Glaucoma Maternal Grandmother   Early menopause Maternal Grandmother 35   Colon cancer Maternal Grandfather   Cancer Maternal Grandfather   Breast cancer Paternal Grandmother   Diabetes Paternal Grandmother   Myocardial Infarction (Heart attack) Son   Sudden cardiac death Son 46   Gallbladder disease Son   Review of Systems:  A 10+ ROS was performed, reviewed, and the pertinent orthopaedic findings are documented in the HPI.   Physical Examination:  BP 110/72 (BP Location: Left upper arm, Patient Position: Sitting, BP Cuff Size: Adult)   Ht 160 cm (5\' 3" )   Wt 69.7 kg (153 lb 9.6 oz)   BMI 27.21 kg/m   Patient is a well-developed, well-nourished female in no acute distress. Patient has normal mood and affect. Patient is alert and oriented to person, place, and time.   HEENT: Atraumatic, normocephalic. Pupils equal and reactive to light. Extraocular motion intact. Noninjected sclera.  Cardiovascular: Regular rate and rhythm, with no murmurs, rubs, or gallops. Radial pulse 2+  Respiratory: Lungs clear to auscultation bilaterally.  Left shoulder exam: On inspection, her surgical incision and arthroscopic portal sites again are well-healed and without evidence for infection. There is perhaps minimal swelling around the shoulder, but no erythema, ecchymosis, abrasions, or other skin abnormalities are identified. She experiences mild-moderate tenderness to palpation over the anterolateral aspect of the shoulder.  Actively, she is able to forward flex to 120 degrees, abduct to 70 degrees, and internally rotate to her left PSIS.  Passively, she is able to tolerate forward flexion to 165 degrees and abduction to 155 degrees.  At 90 degrees of abduction, she is able to tolerate external rotation to 80 degrees and internal rotation to 50 degrees.  She notes mild-moderate pain with forward flexion, abduction, and internal rotation at 90 degrees of abduction.  She exhibits 3+/5 strength with resisted  forward flexion and abduction, and 4/5 strength with resisted internal and external rotation.    Sensation is intact over the median, radial, ulnar, and axillary nerve distributions. Patient able to make an OK sign, thumbs up, and criss-cross the 2nd and 3rd digits.   Tests Performed/Reviewed:  MRI OF THE LEFT SHOULDER:  1. Evidence of prior rotator cuff repair without recurrent tear.  2. Moderate supraspinatus tendinosis with focal articular surface  fraying of the distal tendon.  3. Moderate infraspinatus tendinosis with focal bursal surface  fraying and fissuring of the distal tendon.  4. Mild acromioclavicular osteoarthritis.  5. Moderate subacromial/subdeltoid bursitis.   Impression:  1. Status post left rotator cuff repair Z98.890  2. Nontraumatic incomplete tear of left rotator cuff M75.112  3. Rotator cuff tendinitis, left M75.82   Plan:  The treatment options were discussed with the patient. In addition, patient educational materials were provided regarding the diagnosis and treatment options. The patient is quite frustrated by her persistent symptoms and function limitations, and is ready to consider any treatment that might help to alleviate some of her symptoms. Therefore, given that the injection did see appear to provide some benefit to her left shoulder symptoms, I have recommended a surgical procedure, specifically a left shoulder arthroscopy with debridement, decompression, and possible repair of the recurrent rotator cuff tear. The procedure was discussed with the patient, as were the potential risks (including bleeding, infection, nerve and/or blood vessel injury, persistent or recurrent pain, failure of the repair, progression of arthritis, need for further surgery, blood clots, strokes, heart attacks and/or arhythmias, pneumonia, etc.) and benefits. The patient states her understanding and wishes to proceed. She will follow up post-surgery, routine.   H&P reviewed and  patient re-examined. No changes.

## 2021-12-08 NOTE — Anesthesia Procedure Notes (Signed)
Anesthesia Regional Block: Interscalene brachial plexus block   Pre-Anesthetic Checklist: , timeout performed,  Correct Patient, Correct Site, Correct Laterality,  Correct Procedure,, site marked,  Risks and benefits discussed,  Surgical consent,  Pre-op evaluation,  At surgeon's request and post-op pain management  Laterality: Left  Prep: chloraprep       Needles:  Injection technique: Single-shot  Needle Type: Echogenic Needle          Additional Needles:   Procedures:,,,, ultrasound used (permanent image in chart),,   Motor weakness within 20 minutes.  Narrative:  Start time: 12/08/2021 10:04 AM End time: 12/08/2021 10:07 AM Injection made incrementally with aspirations every 5 mL.  Performed by: Personally  Anesthesiologist: Darrin Nipper, MD  Additional Notes: Functioning IV was confirmed and monitors applied.  Sterile prep and drape, hand hygiene and sterile gloves were used. Ultrasound guidance: relevant anatomy identified, needle position confirmed, local anesthetic spread visualized around nerve(s), vascular puncture avoided.  Image saved to electronic medical record.  Negative aspiration prior to incremental administration of local anesthetic for total 20 ml Exparel and 10 ml bupivacaine 0.5% given in interscalene distribution. The patient tolerated the procedure well. Vital signs and moderate sedation medications recorded in RN notes.

## 2021-12-08 NOTE — Op Note (Signed)
12/08/2021  12:01 PM  Patient:   Natasha Estrada  Pre-Op Diagnosis:   Impingement/tendinopathy with possible recurrent rotator cuff tear, left shoulder.  Post-Op Diagnosis:   Impingement/tendinopathy with recurrent partial-thickness rotator cuff tear, left shoulder.  Procedure:   Limited arthroscopic debridement, arthroscopic subacromial decompression, and mini-open rotator cuff repair, left shoulder.  Anesthesia:   General endotracheal with interscalene block using Exparel placed preoperatively by the anesthesiologist.  Surgeon:   Pascal Lux, MD  Assistant:   Cameron Proud, PA-C; Rocco Pauls, PA-S  Findings:   As above. There was a recurrent partial-thickness rotator cuff tear involving the articular insertional fibers of the anterior portion of the supraspinatus tendon, as well as moderate fraying of the bursal side of the supraspinatus tendon which appeared to be the result of residual impingement from the anterior lip of the acromion. The remaining portions of the rotator cuff are in satisfactory condition, as well as the labrum. The articular surfaces of the glenoid and humerus both were in satisfactory condition as well.  Complications:   None  Fluids:   700 cc  Estimated blood loss:   3 cc  Tourniquet time:   None  Drains:   None  Closure:   Staples      Brief clinical note:   The patient is a 51 year old female who is now nearly 14 months status post a left shoulder arthroscopy with debridement, decompression, repair of a recurrent rotator cuff tear, and biceps tenodesis. Despite extensive physical therapy, medications, activity modification, and injections, she continues to have pain referable to the shoulder. The patient's history and examination are consistent with impingement/tendinopathy with a possible rotator cuff tear as suggested by a repeat preoperative MRI scan. The patient presents at this time for definitive management of these recalcitrant shoulder  symptoms.  Procedure:   The patient underwent placement of an interscalene block using Exparel by the anesthesiologist in the preoperative holding area before being brought into the operating room and lain in the supine position. The patient then underwent general endotracheal intubation and anesthesia before being repositioned in the beach chair position using the beach chair positioner. The left shoulder and upper extremity were prepped with ChloraPrep solution before being draped sterilely. Preoperative antibiotics were administered. A timeout was performed to confirm the proper surgical site before the expected portal sites and incision site were injected with 0.5% Sensorcaine with epinephrine.   A posterior portal was created and the glenohumeral joint thoroughly inspected with the findings as described above. An anterior portal was created using an outside-in technique. The labrum and rotator cuff were further probed, again confirming the above-noted findings. Areas of mild synovitis and the torn portion of the articular insertional fibers of the supraspinatus tendon were debrided back to stable margins using the full-radius resector. The ArthroCare wand was inserted and used to obtain hemostasis. The instruments were removed from the joint after suctioning the excess fluid.  The camera was repositioned through the posterior portal into the subacromial space. A separate lateral portal was created using an outside-in technique. The 3.5 mm full-radius resector was introduced and used to perform a subtotal bursectomy. The ArthroCare wand was then inserted and used to remove the periosteal tissue off the undersurface of the anterior third of the acromion as well as to recess the coracoacromial ligament from its attachment along the anterior and lateral margins of the acromion. The 4.0 mm acromionizing bur was introduced and used to complete the decompression by removing the undersurface of the anterior third  of the acromion. The full radius resector was reintroduced to remove any residual bony debris before the ArthroCare wand was reintroduced to obtain hemostasis. The instruments were then removed from the subacromial space after suctioning the excess fluid.  An approximately 4-5 cm incision was made over the anterolateral aspect of the shoulder beginning at the anterolateral corner of the acromion and extending distally in line with the bicipital groove. This incision was carried down through the subcutaneous tissues to expose the deltoid fascia. The raphae between the anterior and middle thirds was identified and this plane developed to provide access into the subacromial space. Additional bursal tissues were debrided sharply using Metzenbaum scissors. The rotator cuff was carefully inspected and palpated. Palpation demonstrated a thinned out area in the anterior fibers of the supraspinatus, consistent with what was seen intraoperatively. The margins were debrided sharply with a #15 blade in line with the supraspinatus fibers to complete the tear and the exposed greater tuberosity roughened with a rongeur. The tear was repaired using a single Biomet 2.9 mm JuggerKnot anchor securing the sutures in a side-to-side fashion. An apparent watertight closure was obtained.  The wound was copiously irrigated with sterile saline solution before the deltoid raphae was reapproximated using 2-0 Vicryl interrupted sutures. The subcutaneous tissues were closed in two layers using 2-0 Vicryl interrupted sutures before the skin was closed using staples. The portal sites also were closed using staples. A sterile bulky dressing was applied to the shoulder before the arm was placed into a shoulder immobilizer. The patient was then awakened, extubated, and returned to the recovery room in satisfactory condition after tolerating the procedure well.

## 2021-12-14 ENCOUNTER — Ambulatory Visit: Payer: Federal, State, Local not specified - PPO | Admitting: Urology

## 2021-12-22 NOTE — Progress Notes (Deleted)
09/16/2020 9:21 AM   Natasha Estrada 06-07-1971 [id number]  Referring provider: Wayland Denis, PA-C [address] [place],  [place] [address]  No chief complaint on file.  Urological history: 1. Possible small proteinaceous/hemorrhagic cyst -Incidental finding found on recent Lumbar MRI in 07/2020.  -Renal mass protocol MRI scheduled 10/14/2020 Too small to characterize on post-contrast imaging, but strongly favored to represent a hemorrhagic cyst (especially given size regression).  This could be re-evaluated with follow-up pre and post contrast abdominal MRI at 1 year, if desired. -repeat MRI ordered for 10/2021  2.  Nephrolithiasis -CT renal stone study 11/2021 - 2 stones within the upper pole collecting system of the left kidney. The largest measures 9 mm.  These are unchanged compared with the previous exam  3. rUTI's - risk factors of age, constipation and incontinence -CT renal stone study 11/2021 -pyelonephritis right kidney -cysto 05/2020 was NED  -documented positive urine culture over the last year  11/11/2021 E. Coli  10/15/2021 E. Coli       HPI: Natasha Estrada is a 51 y.o. female who presents for follow-up after being treated for pyelonephritis.    At her visit on November 11, 2021, she presented to our office after recently being admitted for sepsis with E. coli UTI and feeling poorly.  Her UA was nitrate positive with pyuria and bacteriuria and she had leukocytosis.  A stat CT performed that day noted pyelonephritis of the right kidney, but no obstructing stones.   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 on daily antibix treatment    Surgical History: Past Surgical History:  Procedure Laterality Date   BLADDER STONE REMOVAL  2018   CERVICAL CONE BIOPSY  2018   CHOLECYSTECTOMY     GASTRIC ROUX-EN-Y  2013   KYPHOPLASTY N/A 12/07/2020   Procedure: L2 Kyphoplasty;  Surgeon: Hessie Knows, MD;  Location: [facility] OR[facility];  Service: Orthopedics;  Laterality: N/A;   ROTATOR CUFF REPAIR Left 2010   SHOULDER ARTHROSCOPY WITH SUBACROMIAL DECOMPRESSION AND OPEN ROTATOR C Left 10/19/2020   Procedure: LEFT SHOULDER ARTHROSCOPY WITH DEBRIDEMENT DECOMPRESSION AND POSSIBLE BICEP TENODESIS;  Surgeon: Corky Mull, MD;  Location: [facility];  Service: Orthopedics;  Laterality: Left;   SHOULDER ARTHROSCOPY WITH SUBACROMIAL DECOMPRESSION AND OPEN ROTATOR C Left 12/08/2021   Procedure: Left shoulder arthroscopy with debridement, decompression, and rotator cuff repair;  Surgeon: Corky Mull, MD;  Location: [facility];  Service: Orthopedics;  Laterality: Left;   TONSILLECTOMY     as a child   transforaminal epidural steroid injection Left 10/01/2020   C6-7    Home Medications:  Allergies as of 12/23/2021       Reactions   Cefuroxime Shortness Of Breath   1.) Tolerated 1st generation cephalosporin (CEPHALEXIN) courses on 10/17/2021 and 11/11/2021  without documented ADRs. 2.) Tolerates Augmentin per husband Joe   Sulfa Antibiotics Rash        Medication List        Accurate as of December 22, 2021  9:21 AM. If you have any questions, ask your nurse or doctor.          acetaminophen 325 MG tablet Commonly known as: TYLENOL Take 2 tablets (650 mg total) by mouth every 6 (six) hours as needed for mild pain (or Fever >/= 101).   buPROPion 150 MG 24 hr tablet Commonly known as: WELLBUTRIN XL Take 150  mg by mouth daily.   busPIRone 15 MG tablet Commonly known as: BUSPAR Take 15 mg by mouth 2 (two) times daily.   cyclobenzaprine 10 MG tablet Commonly known as: FLEXERIL Take 10 mg by mouth 3 (three) times daily as needed for muscle spasms.   DULoxetine 60 MG capsule Commonly known as: CYMBALTA Take 60 mg by mouth daily.   EPINEPHrine 0.3 mg/0.3 mL Soaj injection Commonly known as: EPI-PEN Inject 0.3 mg into the muscle once as needed (if patient exhibits significant signs and symptoms of allergic reaction.).   fluticasone 50 MCG/ACT nasal spray Commonly known as: FLONASE Place 1 spray into both nostrils daily as needed for rhinitis or allergies.   gabapentin 600 MG tablet Commonly known as: NEURONTIN Take 600 mg by mouth daily.   levonorgestrel 20 MCG/24HR IUD Commonly known as: MIRENA 1 each by Intrauterine route once.   Linzess 145 MCG Caps capsule Generic drug: linaclotide Take 145 mcg by mouth daily as needed (Constipation).   losartan 50 MG tablet Commonly known as: COZAAR Take 50 mg by mouth daily.   omeprazole 20 MG capsule Commonly known as: PRILOSEC Take 20 mg by mouth daily.   oxyCODONE 5 MG immediate release tablet Commonly known as: Roxicodone Take 1-2 tablets (5-10 mg total) by mouth every 4 (four) hours as needed for moderate pain or severe pain.   propranolol 40 MG tablet Commonly known as: INDERAL Take 40 mg by mouth at bedtime.   Saxenda 18 MG/3ML Sopn Generic drug: Liraglutide -Weight Management Inject 0.6 mg into the skin daily.   trimethoprim 100 MG tablet Commonly known as: TRIMPEX Take 100 mg by mouth 2 (two) times daily.   zolpidem 5 MG tablet Commonly known as: AMBIEN Take 2 tablets (10 mg total) by mouth at bedtime.        Allergies:  Allergies  Allergen Reactions   Cefuroxime Shortness Of Breath    1.) Tolerated 1st generation cephalosporin (CEPHALEXIN) courses on 10/17/2021 and 11/11/2021 without documented ADRs. 2.)  Tolerates Augmentin per husband Joe   Sulfa Antibiotics Rash    Family History: Family History  Problem Relation Age of Onset   Hypertension Mother     Social History:  reports that she has never smoked. She has never used smokeless tobacco. She reports that she does not currently use alcohol. She reports that she does not use drugs.   Physical Exam: There were no vitals taken for this visit.  Constitutional:  Well nourished. Alert and oriented, No acute distress. HEENT: [place] AT, moist mucus membranes.  Trachea midline, no masses. Cardiovascular: No clubbing, cyanosis, or edema. Respiratory: Normal respiratory effort, no increased work of breathing. GI: Abdomen is soft, non tender, non distended, no abdominal masses. Liver and spleen not palpable.  No hernias appreciated.  Stool sample for occult testing is not indicated.   GU: No CVA tenderness.  No bladder fullness or  masses.  *** external genitalia, *** pubic hair distribution, no lesions.  Normal urethral meatus, no lesions, no prolapse, no discharge.   No urethral masses, tenderness and/or tenderness. No bladder fullness, tenderness or masses. *** vagina mucosa, *** estrogen effect, no discharge, no lesions, *** pelvic support, *** cystocele and *** rectocele noted.  No cervical motion tenderness.  Uterus is freely mobile and non-fixed.  No adnexal/parametria masses or tenderness noted.  Anus and perineum are without rashes or lesions.   ***  Skin: No rashes, bruises or suspicious lesions. Lymph: No cervical or inguinal adenopathy. Neurologic: Grossly intact, no focal deficits, moving all 4 extremities. Psychiatric: Normal mood and affect.    Laboratory Data: N/A   Pertinent Imaging N/A  Assessment & Plan:    1. Recurrent UTI -***  2. Left nephrolithiasis -seen on recent CT -Non obstructing  3. Possible small proteinaceous/hemorrhagic cyst.   -Renal mass protocol MRI pending  [facility] [address], [place], [place] [address] [phone]   Zara Council, PA-C

## 2021-12-23 ENCOUNTER — Ambulatory Visit: Payer: Federal, State, Local not specified - PPO | Admitting: Urology

## 2022-01-17 NOTE — Progress Notes (Unsigned)
09/16/2020 8:50 PM   Natasha Estrada 04-21-1971 [id number]  Referring provider: Wayland Denis, PA-C [address] [place],  [place] [address]  No chief complaint on file.  Urological history: 1. Possible small proteinaceous/hemorrhagic cyst -Incidental finding found on recent Lumbar MRI in 07/2020.  -Renal mass protocol MRI scheduled 10/14/2020 Too small to characterize on post-contrast imaging, but strongly favored to represent a hemorrhagic cyst (especially given size regression).  This could be re-evaluated with follow-up pre and post contrast abdominal MRI at 1 year, if desired.  2.  Nephrolithiasis -CT renal stone study 11/2021 - 2 stones within the upper pole collecting system of the left kidney. The largest measures 9 mm.  These are unchanged compared with the previous exam  3. rUTI's - risk factors of age, constipation and incontinence -CT renal stone study 11/2021 -pyelonephritis right kidney -cysto 05/2020 was NED  -documented positive urine culture over the last year  11/11/2021 E. Coli  10/15/2021 E. Coli       HPI: Natasha Estrada is a 51 y.o. female who presents for follow-up after being treated for pyelonephritis.    At her visit on November 11, 2021, she presented to our office after recently being admitted for sepsis with E. coli UTI and feeling poorly.  Her UA was nitrate positive with pyuria and bacteriuria and she had leukocytosis.  A stat CT performed that day noted pyelonephritis of the right kidney, but no obstructing stones.   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 on daily antibix treatment    Surgical History: Past Surgical History:  Procedure Laterality Date   BLADDER STONE REMOVAL  2018   CERVICAL CONE BIOPSY  2018   CHOLECYSTECTOMY     GASTRIC ROUX-EN-Y  2013   KYPHOPLASTY N/A 12/07/2020   Procedure: L2 Kyphoplasty;  Surgeon: Hessie Knows, MD;  Location: [facility];  Service: Orthopedics;  Laterality: N/A;   ROTATOR CUFF REPAIR Left 2010   SHOULDER ARTHROSCOPY WITH SUBACROMIAL DECOMPRESSION AND OPEN ROTATOR C Left 10/19/2020   Procedure: LEFT SHOULDER ARTHROSCOPY WITH DEBRIDEMENT DECOMPRESSION AND POSSIBLE BICEP TENODESIS;  Surgeon: Corky Mull, MD;  Location: [facility];  Service: Orthopedics;  Laterality: Left;   SHOULDER ARTHROSCOPY WITH SUBACROMIAL DECOMPRESSION AND OPEN ROTATOR C Left 12/08/2021   Procedure: Left shoulder arthroscopy with debridement, decompression, and rotator cuff repair;  Surgeon: Corky Mull, MD;  Location: [facility];  Service: Orthopedics;  Laterality: Left;   TONSILLECTOMY     as a child   transforaminal epidural steroid injection Left 10/01/2020   C6-7    Home Medications:  Allergies as of 01/18/2022       Reactions   Cefuroxime Shortness Of Breath   1.) Tolerated 1st generation cephalosporin (CEPHALEXIN) courses on 10/17/2021 and 11/11/2021 without documented ADRs. 2.) Tolerates  Augmentin per husband Joe   Sulfa Antibiotics Rash        Medication List        Accurate as of January 17, 2022  8:50 PM. If you have any questions, ask your nurse or doctor.          acetaminophen 325 MG tablet Commonly known as: TYLENOL Take 2 tablets (650 mg total) by mouth every 6 (six) hours as needed for mild pain (or Fever >/= 101).   buPROPion 150 MG 24 hr tablet Commonly known as: WELLBUTRIN XL Take 150 mg by mouth daily.   busPIRone  15 MG tablet Commonly known as: BUSPAR Take 15 mg by mouth 2 (two) times daily.   cyclobenzaprine 10 MG tablet Commonly known as: FLEXERIL Take 10 mg by mouth 3 (three) times daily as needed for muscle spasms.   DULoxetine 60 MG capsule Commonly known as: CYMBALTA Take 60 mg by mouth daily.   EPINEPHrine 0.3 mg/0.3 mL Soaj injection Commonly known as: EPI-PEN Inject 0.3 mg into the muscle once as needed (if patient exhibits significant signs and symptoms of allergic reaction.).   fluticasone 50 MCG/ACT nasal spray Commonly known as: FLONASE Place 1 spray into both nostrils daily as needed for rhinitis or allergies.   gabapentin 600 MG tablet Commonly known as: NEURONTIN Take 600 mg by mouth daily.   levonorgestrel 20 MCG/24HR IUD Commonly known as: MIRENA 1 each by Intrauterine route once.   Linzess 145 MCG Caps capsule Generic drug: linaclotide Take 145 mcg by mouth daily as needed (Constipation).   losartan 50 MG tablet Commonly known as: COZAAR Take 50 mg by mouth daily.   omeprazole 20 MG capsule Commonly known as: PRILOSEC Take 20 mg by mouth daily.   oxyCODONE 5 MG immediate release tablet Commonly known as: Roxicodone Take 1-2 tablets (5-10 mg total) by mouth every 4 (four) hours as needed for moderate pain or severe pain.   propranolol 40 MG tablet Commonly known as: INDERAL Take 40 mg by mouth at bedtime.   Saxenda 18 MG/3ML Sopn Generic drug: Liraglutide -Weight Management Inject 0.6 mg into the skin daily.   trimethoprim 100 MG tablet Commonly known as: TRIMPEX Take 100 mg by mouth 2 (two) times daily.   zolpidem 5 MG tablet Commonly known as: AMBIEN Take 2 tablets (10 mg total) by mouth at bedtime.        Allergies:  Allergies  Allergen Reactions   Cefuroxime Shortness Of Breath    1.) Tolerated 1st generation cephalosporin (CEPHALEXIN) courses on 10/17/2021 and 11/11/2021 without documented ADRs. 2.) Tolerates Augmentin per husband Joe    Sulfa Antibiotics Rash    Family History: Family History  Problem Relation Age of Onset   Hypertension Mother     Social History:  reports that she has never smoked. She has never used smokeless tobacco. She reports that she does not currently use alcohol. She reports that she does not use drugs.   Physical Exam: There were no vitals taken for this visit.  Constitutional:  Well nourished. Alert and oriented, No acute distress. HEENT: [place] AT, moist mucus membranes.  Trachea midline, no masses. Cardiovascular: No clubbing, cyanosis, or edema. Respiratory: Normal respiratory effort, no increased work of breathing. GI: Abdomen is soft, non tender, non distended, no abdominal masses. Liver and spleen not palpable.  No hernias appreciated.  Stool sample for occult testing is not indicated.   GU: No CVA tenderness.  No bladder fullness or masses.  *** external genitalia, ***  pubic hair distribution, no lesions.  Normal urethral meatus, no lesions, no prolapse, no discharge.   No urethral masses, tenderness and/or tenderness. No bladder fullness, tenderness or masses. *** vagina mucosa, *** estrogen effect, no discharge, no lesions, *** pelvic support, *** cystocele and *** rectocele noted.  No cervical motion tenderness.  Uterus is freely mobile and non-fixed.  No adnexal/parametria masses or tenderness noted.  Anus and perineum are without rashes or lesions.   ***  Skin: No rashes, bruises or suspicious lesions. Lymph: No cervical or inguinal adenopathy. Neurologic: Grossly intact, no focal deficits, moving all 4 extremities. Psychiatric: Normal mood and affect.    Laboratory Data: N/A   Pertinent Imaging N/A  Assessment & Plan:    1. Recurrent UTI -***  2. Left nephrolithiasis -seen on recent CT -Non obstructing  3. Possible small proteinaceous/hemorrhagic cyst.   -Renal mass protocol MRI pending  [facility] [address], [place], [place] [address] [phone]   Zara Council, PA-C

## 2022-01-18 ENCOUNTER — Ambulatory Visit: Payer: Federal, State, Local not specified - PPO | Admitting: Urology

## 2022-01-23 DIAGNOSIS — G4733 Obstructive sleep apnea (adult) (pediatric): Secondary | ICD-10-CM | POA: Insufficient documentation

## 2022-01-25 NOTE — Progress Notes (Signed)
09/16/2020 10:33 AM   Natasha Estrada [birth year] [id number]  Referring provider: Wayland Denis, PA-C [address] [place],  [place] [address]  Chief Complaint  Patient presents with   Recurrent UTI   Urological history: 1. Possible small right proteinaceous/hemorrhagic cyst -Incidental finding found on recent Lumbar MRI in 07/2020.  -Renal mass protocol MRI scheduled 10/14/2020 Too small to characterize on post-contrast imaging, but strongly favored to represent a hemorrhagic cyst (especially given size regression).  This could be re-evaluated with follow-up pre and post contrast abdominal MRI at 1 year, if desired.  2.  Nephrolithiasis -CT renal stone study 11/2021 - 2 stones within the upper pole collecting system of the left kidney. The largest measures 9 mm.  These are unchanged compared with the previous exam  3. rUTI's - risk factors of age, constipation and incontinence -CT renal stone study 11/2021 -pyelonephritis right kidney -cysto 05/2020 was NED  -documented positive urine culture over the last year  11/11/2021 E. Coli  10/15/2021 E. Coli       HPI: Natasha Estrada is a 51 y.o. female who presents for follow-up after being treated for pyelonephritis.    At her visit on November 11, 2021, she presented to our office after recently being admitted for sepsis with E. coli UTI and feeling poorly.  Her UA was nitrate positive with pyuria and bacteriuria and she had leukocytosis.  A stat CT performed that day noted pyelonephritis of the right kidney, but no obstructing stones.  Today, she feels well.  She believes that her infections may have been precipitated by sexual activity.  She is not currently in a relationship at this time, so it is not a concern for her at the moment.  Patient denies any modifying or aggravating factors.  Patient denies any gross hematuria, dysuria or suprapubic/flank pain.  Patient denies any fevers, chills, nausea or  vomiting.    UA bland.    We also discussed her history of having proteinaceous/hemorrhagic cysts in the kidneys and whether or not she would like to repeat an MRI at this time.   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 on daily antibix treatment    Surgical History: Past Surgical History:  Procedure Laterality Date   BLADDER STONE REMOVAL  2018   CERVICAL CONE BIOPSY  2018   CHOLECYSTECTOMY     GASTRIC ROUX-EN-Y  2013   KYPHOPLASTY N/A 12/07/2020   Procedure: L2 Kyphoplasty;  Surgeon: Hessie Knows, MD;  Location: [facility];  Service: Orthopedics;  Laterality: N/A;   ROTATOR CUFF REPAIR Left 2010   SHOULDER ARTHROSCOPY WITH SUBACROMIAL DECOMPRESSION AND OPEN ROTATOR C Left 10/19/2020   Procedure: LEFT SHOULDER ARTHROSCOPY WITH DEBRIDEMENT DECOMPRESSION AND POSSIBLE BICEP TENODESIS;  Surgeon: Corky Mull, MD;  Location: [facility];  Service:  Orthopedics;  Laterality: Left;   SHOULDER ARTHROSCOPY WITH SUBACROMIAL DECOMPRESSION AND OPEN ROTATOR C Left 12/08/2021   Procedure: Left shoulder arthroscopy with debridement, decompression, and rotator cuff repair;  Surgeon: Corky Mull, MD;  Location: [facility];  Service: Orthopedics;  Laterality: Left;   TONSILLECTOMY     as a child   transforaminal epidural steroid injection Left 10/01/2020   C6-7    Home Medications:  Allergies as of 01/26/2022       Reactions   Cefuroxime Shortness Of Breath    1.) Tolerated 1st generation cephalosporin (CEPHALEXIN) courses on 10/17/2021 and 11/11/2021 without documented ADRs. 2.) Tolerates Augmentin per husband Joe   Sulfa Antibiotics Rash        Medication List        Accurate as of January 26, 2022 10:33 AM. If you have any questions, ask your nurse or doctor.          acetaminophen 325 MG tablet Commonly known as: TYLENOL Take 2 tablets (650 mg total) by mouth every 6 (six) hours as needed for mild pain (or Fever >/= 101).   buPROPion 150 MG 24 hr tablet Commonly known as: WELLBUTRIN XL Take 150 mg by mouth daily.   busPIRone 15 MG tablet Commonly known as: BUSPAR Take 15 mg by mouth 2 (two) times daily.   cyclobenzaprine 10 MG tablet Commonly known as: FLEXERIL Take 10 mg by mouth 3 (three) times daily as needed for muscle spasms.   DULoxetine 60 MG capsule Commonly known as: CYMBALTA Take 60 mg by mouth daily.   EPINEPHrine 0.3 mg/0.3 mL Soaj injection Commonly known as: EPI-PEN Inject 0.3 mg into the muscle once as needed (if patient exhibits significant signs and symptoms of allergic reaction.).   fluticasone 50 MCG/ACT nasal spray Commonly known as: FLONASE Place 1 spray into both nostrils daily as needed for rhinitis or allergies.   gabapentin 600 MG tablet Commonly known as: NEURONTIN Take 600 mg by mouth daily.   levonorgestrel 20 MCG/24HR IUD Commonly known as: MIRENA 1 each by Intrauterine route once.   Linzess 145 MCG Caps capsule Generic drug: linaclotide Take 145 mcg by mouth daily as needed (Constipation).   losartan 50 MG tablet Commonly known as: COZAAR Take 50 mg by mouth daily.   omeprazole 20 MG capsule Commonly known as: PRILOSEC Take 20 mg by mouth daily.   oxyCODONE 5 MG immediate release tablet Commonly known as: Roxicodone Take 1-2 tablets (5-10 mg total) by mouth every 4 (four) hours as needed for moderate pain or severe pain.   propranolol 40 MG tablet Commonly known as:  INDERAL Take 40 mg by mouth at bedtime.   Saxenda 18 MG/3ML Sopn Generic drug: Liraglutide -Weight Management Inject 0.6 mg into the skin daily.   trimethoprim 100 MG tablet Commonly known as: TRIMPEX Take 100 mg by mouth 2 (two) times daily.   zolpidem 5 MG tablet Commonly known as: AMBIEN Take 2 tablets (10 mg total) by mouth at bedtime.        Allergies:  Allergies  Allergen Reactions   Cefuroxime Shortness Of Breath    1.) Tolerated 1st generation cephalosporin (CEPHALEXIN) courses on 10/17/2021 and 11/11/2021 without documented ADRs. 2.) Tolerates Augmentin per husband Joe   Sulfa Antibiotics Rash    Family History: Family History  Problem Relation Age of Onset   Hypertension Mother     Social History:  reports that she has never smoked. She has never used smokeless tobacco. She reports that she  does not currently use alcohol. She reports that she does not use drugs.   Physical Exam: BP 119/80    Pulse 92    Ht 5\' 4"  (1.626 m)    Wt 150 lb (68 kg)    BMI 25.75 kg/m   Constitutional:  Well nourished. Alert and oriented, No acute distress. HEENT: [place] AT, mask in place trachea midline Cardiovascular: No clubbing, cyanosis, or edema. Respiratory: Normal respiratory effort, no increased work of breathing. Neurologic: Grossly intact, no focal deficits, moving all 4 extremities. Psychiatric: Normal mood and affect.    Laboratory Data: Urinalysis Component     Latest Ref Rng & Units 01/26/2022  Specific Gravity, UA     1.005 - 1.030 1.020  pH, UA     5.0 - 7.5 5.5  Color, UA     Yellow Yellow  Appearance Ur     Clear Clear  Leukocytes,UA     Negative Trace (A)  Protein,UA     Negative/Trace Negative  Glucose, UA     Negative Negative  Ketones, UA     Negative Negative  RBC, UA     Negative Negative  Bilirubin, UA     Negative Negative  Urobilinogen, Ur     0.2 - 1.0 mg/dL 0.2  Nitrite, UA     Negative Negative  Microscopic Examination      See  below:   Component     Latest Ref Rng & Units 01/26/2022          WBC, UA     0 - 5 /hpf 0-5  RBC     0 - 2 /hpf None seen  Epithelial Cells (non renal)     0 - 10 /hpf 0-10  Mucus, UA     Not Estab. Present (A)  Bacteria, UA     None seen/Few Few (A)  I have reviewed the labs.    Pertinent Imaging N/A  Assessment & Plan:    1. Recurrent UTI -Asymptomatic at this visit -We discussed her prophylactic trimethoprim that she takes daily, she would like to wean herself off this medication and will start by taking it every other day -We also discussed postcoital antibiotic should she get into a another intimate relationship and she is agreeable to this idea  2. Left nephrolithiasis -seen on recent CT -Non obstructing  3. Possible small proteinaceous/hemorrhagic cyst.   -She would like to have the repeat MRI done at this time out of abundance of caution as her mother has kidney issues -Renal mass protocol MRI pending  [facility] [address], [place], [place] [address] [phone]   Zara Council, PA-C

## 2022-01-26 ENCOUNTER — Ambulatory Visit: Payer: Federal, State, Local not specified - PPO | Admitting: Urology

## 2022-01-26 ENCOUNTER — Encounter: Payer: Self-pay | Admitting: Urology

## 2022-01-26 ENCOUNTER — Other Ambulatory Visit: Payer: Self-pay

## 2022-01-26 VITALS — BP 119/80 | HR 92 | Ht 64.0 in | Wt 150.0 lb

## 2022-01-26 DIAGNOSIS — N200 Calculus of kidney: Secondary | ICD-10-CM | POA: Diagnosis not present

## 2022-01-26 DIAGNOSIS — N2889 Other specified disorders of kidney and ureter: Secondary | ICD-10-CM | POA: Diagnosis not present

## 2022-01-26 DIAGNOSIS — N390 Urinary tract infection, site not specified: Secondary | ICD-10-CM | POA: Diagnosis not present

## 2022-01-26 LAB — URINALYSIS, COMPLETE
Bilirubin, UA: NEGATIVE
Glucose, UA: NEGATIVE
Ketones, UA: NEGATIVE
Nitrite, UA: NEGATIVE
Protein,UA: NEGATIVE
RBC, UA: NEGATIVE
Specific Gravity, UA: 1.02 (ref 1.005–1.030)
Urobilinogen, Ur: 0.2 mg/dL (ref 0.2–1.0)
pH, UA: 5.5 (ref 5.0–7.5)

## 2022-01-26 LAB — MICROSCOPIC EXAMINATION: RBC, Urine: NONE SEEN /hpf (ref 0–2)

## 2022-03-06 ENCOUNTER — Other Ambulatory Visit
Admission: RE | Admit: 2022-03-06 | Discharge: 2022-03-06 | Disposition: A | Discharge status: Home / Self Care | Payer: Federal, State, Local not specified - PPO | Source: Ambulatory Visit | Attending: Sports Medicine | Admitting: Sports Medicine

## 2022-03-06 DIAGNOSIS — M25412 Effusion, left shoulder: Secondary | ICD-10-CM | POA: Insufficient documentation

## 2022-03-06 DIAGNOSIS — M25512 Pain in left shoulder: Secondary | ICD-10-CM | POA: Diagnosis present

## 2022-03-06 DIAGNOSIS — G8929 Other chronic pain: Secondary | ICD-10-CM | POA: Insufficient documentation

## 2022-03-06 LAB — SYNOVIAL CELL COUNT + DIFF, W/ CRYSTALS
Crystals, Fluid: NONE SEEN
Eosinophils-Synovial: 2 %
Lymphocytes-Synovial Fld: 82 %
Monocyte-Macrophage-Synovial Fluid: 10 %
Neutrophil, Synovial: 6 %
WBC, Synovial: 4182 /mm3 — ABNORMAL HIGH (ref 0–200)

## 2022-03-16 ENCOUNTER — Ambulatory Visit
Admission: RE | Admit: 2022-03-16 | Discharge: 2022-03-16 | Disposition: A | Discharge status: Home / Self Care | Payer: Federal, State, Local not specified - PPO | Source: Ambulatory Visit | Attending: Urology | Admitting: Urology

## 2022-03-16 DIAGNOSIS — N200 Calculus of kidney: Secondary | ICD-10-CM | POA: Diagnosis present

## 2022-03-16 MED ORDER — GADOBUTROL 1 MMOL/ML IV SOLN
7.0000 mL | Freq: Once | INTRAVENOUS | Status: AC | PRN
  Administered 2022-03-16: 7 mL via INTRAVENOUS

## 2022-03-17 ENCOUNTER — Telehealth: Payer: Self-pay | Admitting: Urology

## 2022-03-17 NOTE — Telephone Encounter (Signed)
Patient called the office today to talk to someone about her MRI results.   ? ?Please advise. ?

## 2022-03-17 NOTE — Telephone Encounter (Signed)
Natasha Estrada should pt schedule an OV to discuss? ?

## 2022-03-19 IMAGING — CT CT RENAL STONE PROTOCOL
2 of 4 series · 16 of 46 positions shown, 18 images · non-contrast
Comparison: 06/14/2020

CLINICAL DATA: Right flank pain

EXAM:
CT ABDOMEN AND PELVIS WITHOUT CONTRAST
TECHNIQUE: Multidetector CT imaging of the abdomen and pelvis was performed
following the standard protocol without IV contrast.

[Series 2: renal stone 5.00 · axial · 0.70mm/px · z∈[-1499,-1049]mm · 13 of 100 slices shown, 15 images]
[im 5/100  soft-tissue]
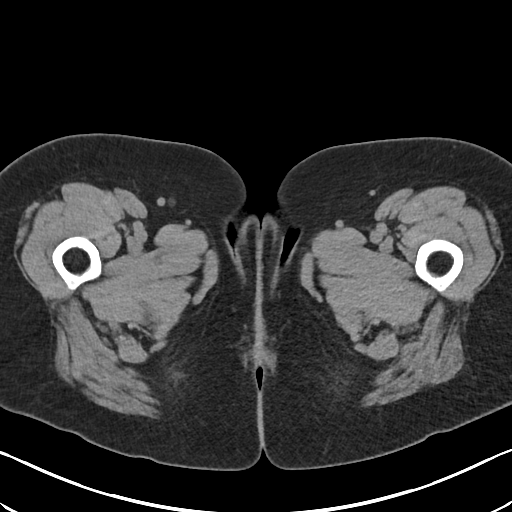
[im 5/100  bone]
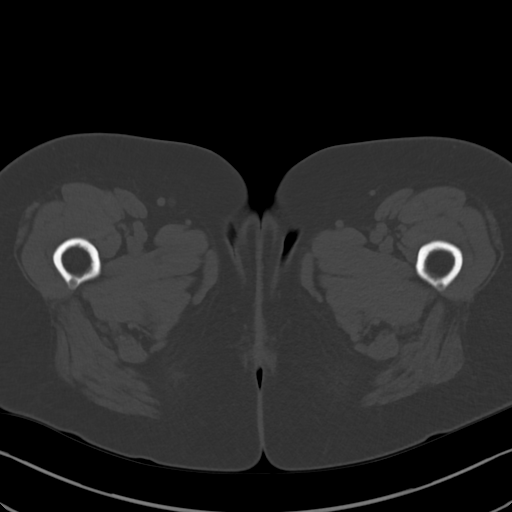
[im 13/100  soft-tissue]
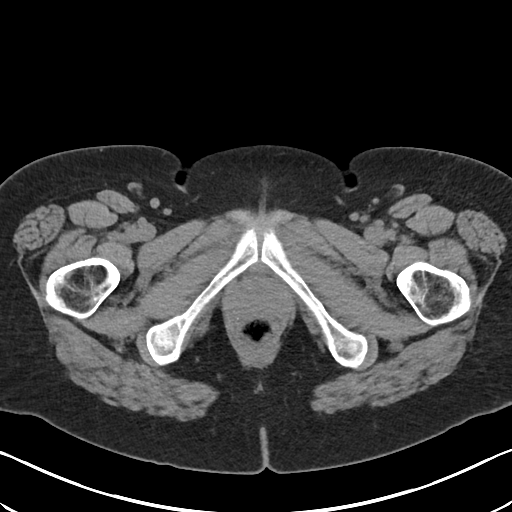
[im 22/100  soft-tissue]
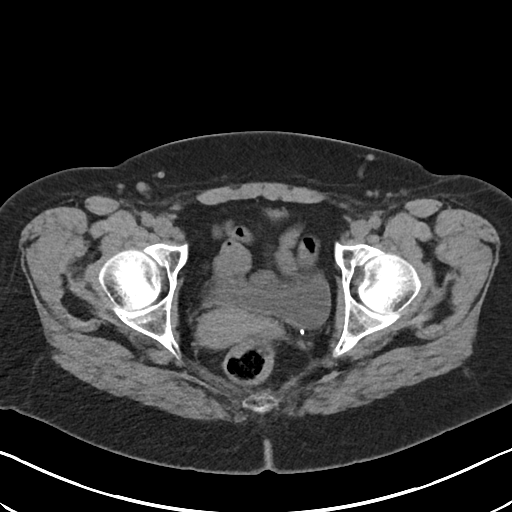
[im 26/100  soft-tissue]
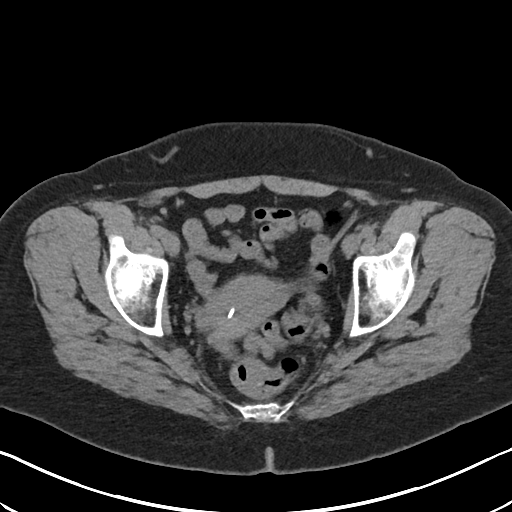
[im 35/100  soft-tissue]
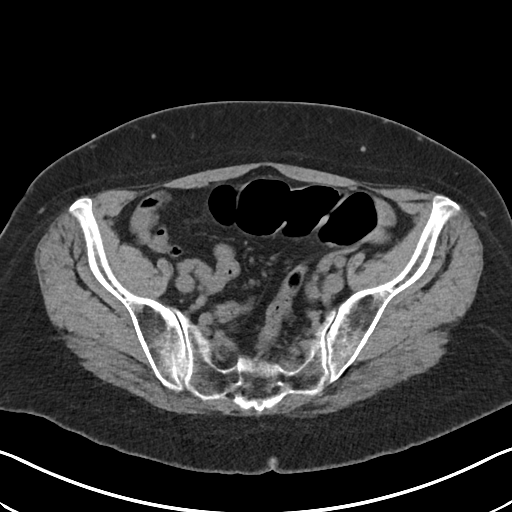
[im 44/100  soft-tissue]
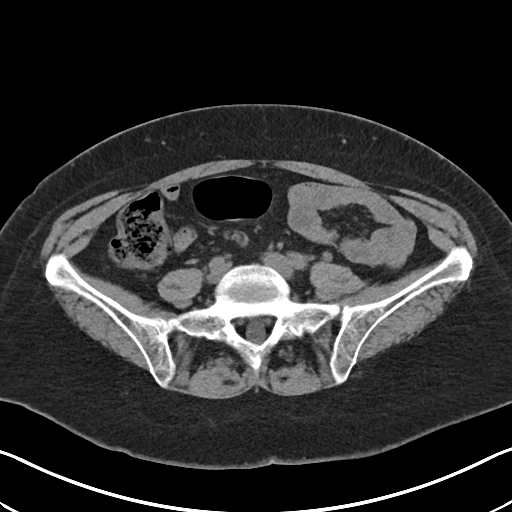
[im 52/100  soft-tissue]
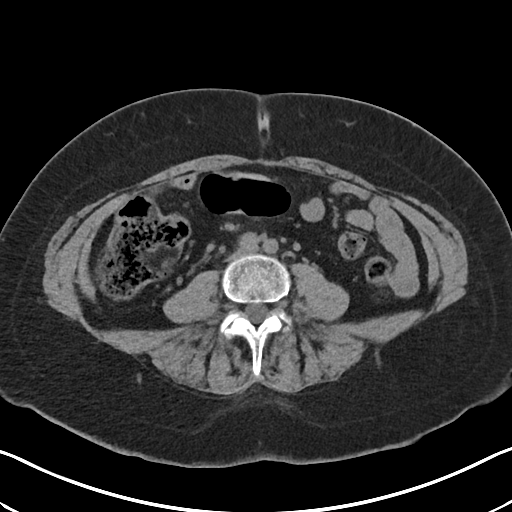
[im 56/100  soft-tissue]
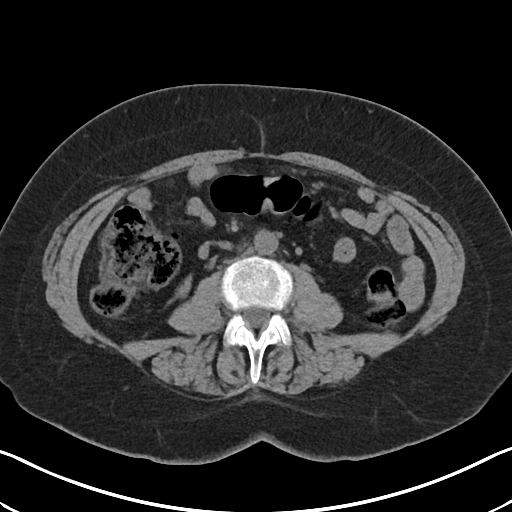
[im 65/100  soft-tissue]
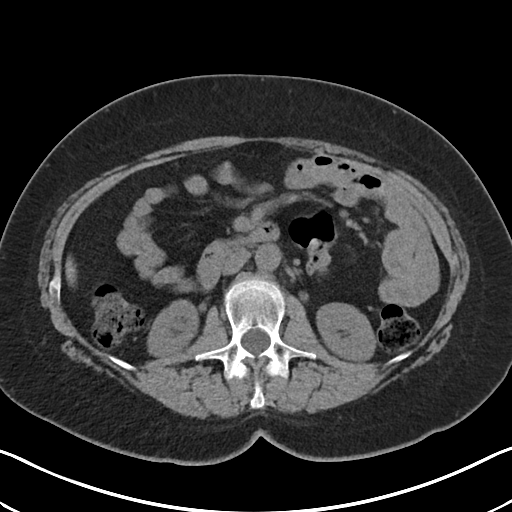
[im 65/100  bone]
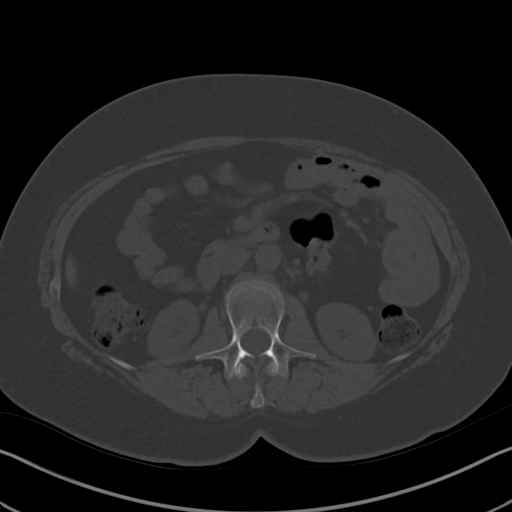
[im 74/100  soft-tissue]
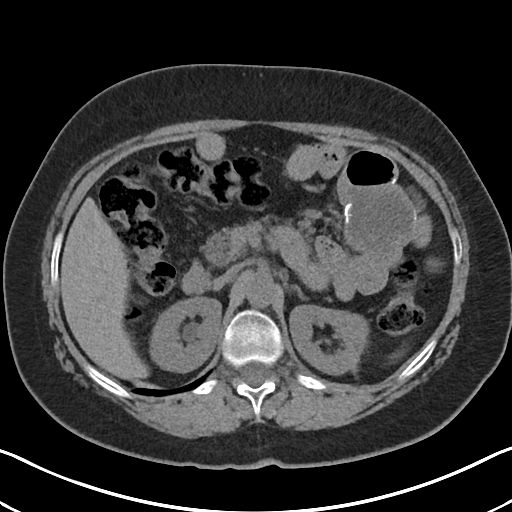
[im 78/100  soft-tissue]
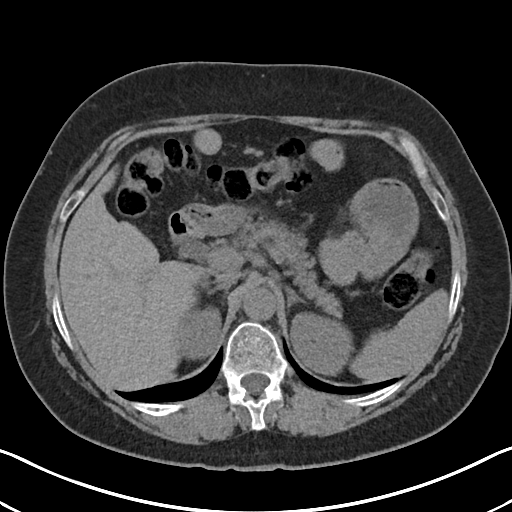
[im 87/100  soft-tissue]
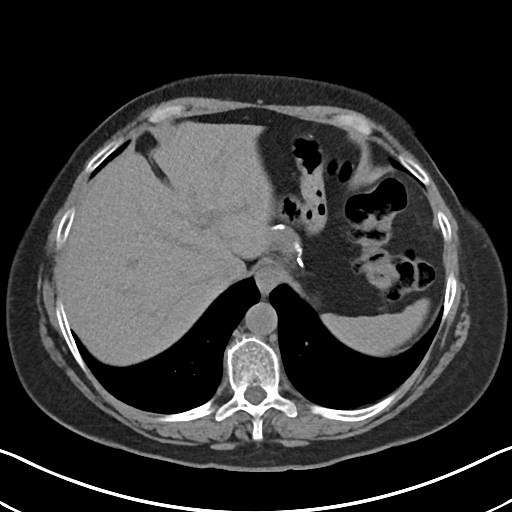
[im 95/100  soft-tissue]
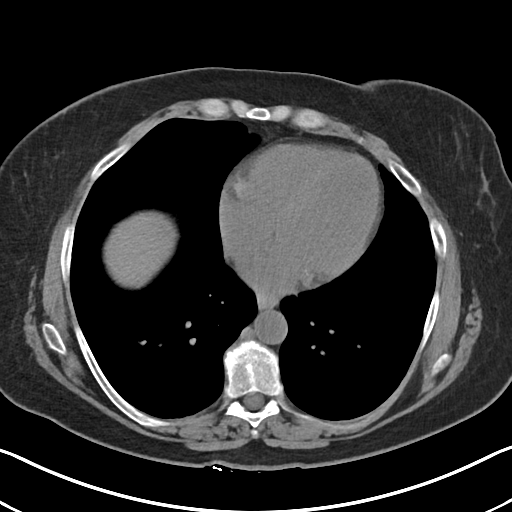

[Series 4: renal stone 2.00 cor · coronal · 0.70mm/px · 3 of 146 slices shown]
[im 49/146  soft-tissue]
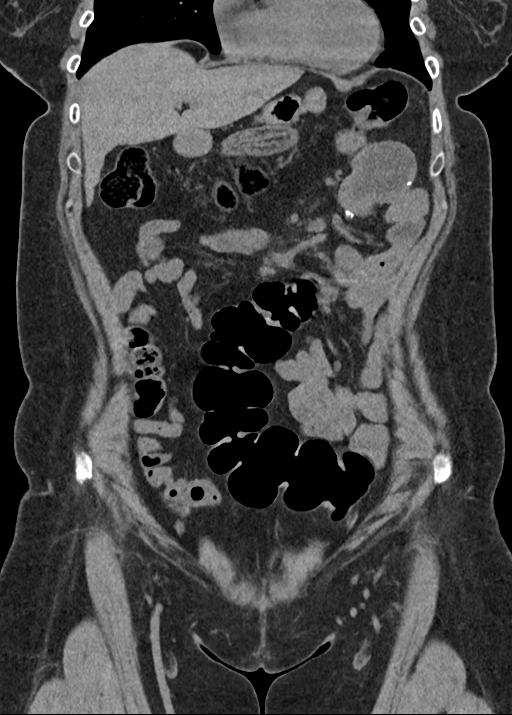
[im 65/146  soft-tissue]
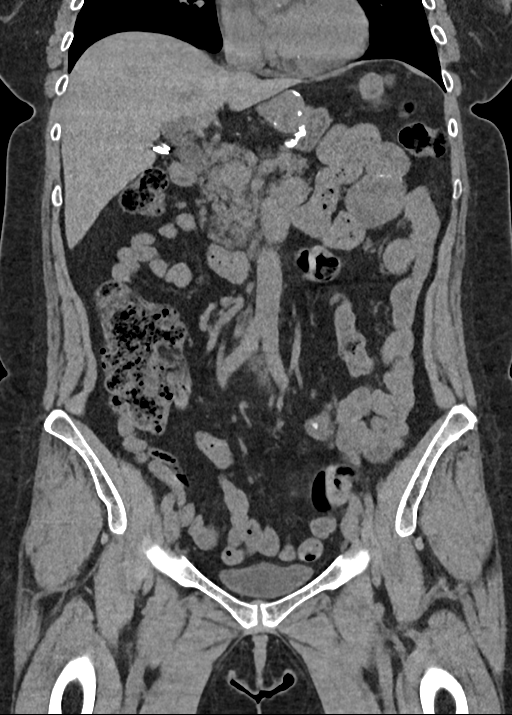
[im 81/146  soft-tissue]
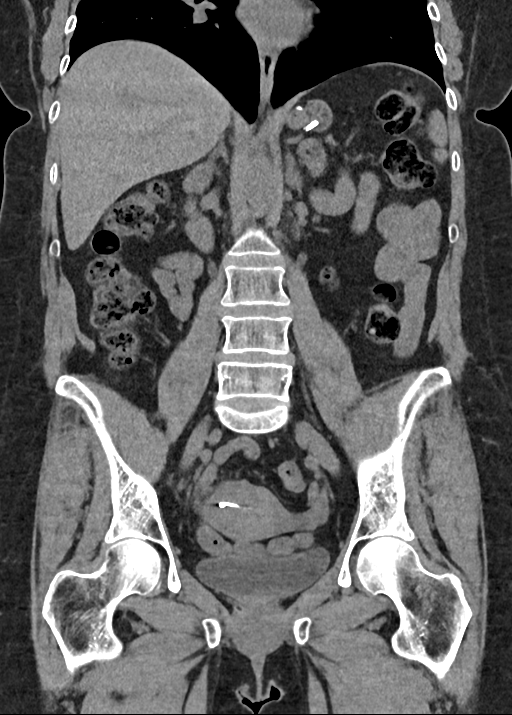

[16 of 46 positions shown; findings below may reference images not displayed]

FINDINGS: Lower chest: No acute abnormality.

Hepatobiliary: No focal liver abnormality is seen. Status post
cholecystectomy. No biliary dilatation.

Pancreas: Unremarkable.

Spleen: Unremarkable.

Adrenals/Urinary Tract: Adrenals are unremarkable. Bilateral
nonobstructing renal calculi are again identified with stable
burden. No ureteral calculus. No hydronephrosis. Bladder is
unremarkable.

Stomach/Bowel: Changes of prior gastric bypass. Normal appendix.
Mild dilatation at the small bowel anastomosis.

Vascular/Lymphatic: No significant vascular findings on this
noncontrast study. No enlarged lymph nodes identified.

Reproductive: Uterus and bilateral adnexa are unremarkable.
Intrauterine device is present.

Other: No ascites.  Abdominal wall is unremarkable.

Musculoskeletal: No acute osseous abnormality.
IMPRESSION: Stable mild burden of nonobstructive renal calculi. No
hydronephrosis or other acute abnormality.

## 2022-03-28 ENCOUNTER — Encounter: Payer: Self-pay | Admitting: Physician Assistant

## 2022-03-28 ENCOUNTER — Other Ambulatory Visit: Payer: Self-pay | Admitting: Surgery

## 2022-03-28 ENCOUNTER — Other Ambulatory Visit: Payer: Self-pay | Admitting: Urology

## 2022-03-28 ENCOUNTER — Ambulatory Visit: Payer: Federal, State, Local not specified - PPO | Admitting: Physician Assistant

## 2022-03-28 VITALS — BP 131/85 | HR 84 | Ht 65.0 in | Wt 150.0 lb

## 2022-03-28 DIAGNOSIS — N390 Urinary tract infection, site not specified: Secondary | ICD-10-CM

## 2022-03-28 DIAGNOSIS — R300 Dysuria: Secondary | ICD-10-CM | POA: Diagnosis not present

## 2022-03-28 DIAGNOSIS — M75112 Incomplete rotator cuff tear or rupture of left shoulder, not specified as traumatic: Secondary | ICD-10-CM

## 2022-03-28 DIAGNOSIS — R109 Unspecified abdominal pain: Secondary | ICD-10-CM

## 2022-03-28 DIAGNOSIS — Z9889 Other specified postprocedural states: Secondary | ICD-10-CM

## 2022-03-28 LAB — URINALYSIS, COMPLETE
Bilirubin, UA: NEGATIVE
Glucose, UA: NEGATIVE
Ketones, UA: NEGATIVE
Nitrite, UA: NEGATIVE
Protein,UA: NEGATIVE
RBC, UA: NEGATIVE
Specific Gravity, UA: 1.015 (ref 1.005–1.030)
Urobilinogen, Ur: 0.2 mg/dL (ref 0.2–1.0)
pH, UA: 6 (ref 5.0–7.5)

## 2022-03-28 LAB — MICROSCOPIC EXAMINATION: Bacteria, UA: NONE SEEN

## 2022-03-28 LAB — BLADDER SCAN AMB NON-IMAGING: Scan Result: 0

## 2022-03-28 NOTE — Progress Notes (Signed)
? ?03/28/2022 ?4:19 PM  ? ?Natasha Estrada ?1971/01/01 ?[id number] ? ?CC: ?Chief Complaint  ?Patient presents with  ? Dysuria  ? ? ?HPI: ?Natasha Estrada is a 51 y.o. female with PMH right renal cyst, left renal stones, and recurrent UTIs on suppressive trimethoprim who presents today for evaluation of possible UTI.  ? ?Today she reports an approximate 4-day history of lower abdominal discomfort consistent with past UTIs.  She has been taking Azo and staying well-hydrated.  She denies fever, chills, nausea, or vomiting. ? ?She previously tried to wean down to every other day trimethoprim, however she felt that she may have been getting a UTI and so she increased this back to daily. ? ?In-office UA today positive for trace leukocyte esterase; urine microscopy with granular casts. PVR 64m. ? ?PMH: ?Past Medical History:  ?Diagnosis Date  ? Anxiety   ? Arthritis   ? Chronic low back pain with left-sided sciatica   ? Chronic pain syndrome   ? Depression   ? Fibroid   ? GERD (gastroesophageal reflux disease)   ? History of kidney stones   ? Hypertension   ? IBS (irritable bowel syndrome)   ? with constipation  ? Insomnia   ? Kidney lesion, native, left 09/2020  ? following renal mass protocol on 10/14/20  ? Kidney stone   ? Lumbar radiculopathy   ? Radiculitis, cervical   ? c7-c8  ? Sleep apnea   ? 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 Urinary tract infection 10/2020  ? recurrent. on daily antibix treatment  ? ? ?Surgical History: ?Past Surgical History:  ?Procedure Laterality Date  ? BLADDER STONE REMOVAL  2018  ? CERVICAL CONE BIOPSY  2018  ? CHOLECYSTECTOMY    ? GASTRIC ROUX-EN-Y  2013  ? KYPHOPLASTY N/A 12/07/2020  ? Procedure: L2 Kyphoplasty;  Surgeon: MHessie Knows MD;  Location: [facility];  Service: Orthopedics;  Laterality: N/A;  ? ROTATOR CUFF REPAIR Left 2010  ? SHOULDER ARTHROSCOPY WITH SUBACROMIAL DECOMPRESSION AND OPEN ROTATOR C Left 10/19/2020  ? Procedure: LEFT SHOULDER ARTHROSCOPY WITH DEBRIDEMENT  DECOMPRESSION AND POSSIBLE BICEP TENODESIS;  Surgeon: PCorky Mull MD;  Location: [facility];  Service: Orthopedics;  Laterality: Left;  ? SHOULDER ARTHROSCOPY WITH SUBACROMIAL DECOMPRESSION AND OPEN ROTATOR C Left 12/08/2021  ? Procedure: Left shoulder arthroscopy with debridement, decompression, and rotator cuff repair;  Surgeon: PCorky Mull MD;  Location: [facility];  Service: Orthopedics;  Laterality: Left;  ? TONSILLECTOMY    ? as a child  ? transforaminal epidural steroid injection Left 10/01/2020  ? C6-7  ? ? ?Home Medications:  ?Allergies as of 03/28/2022   ? ?   Reactions  ? Cefuroxime Shortness Of Breath  ? 1.) Tolerated 1st generation cephalosporin (CEPHALEXIN) courses on 10/17/2021 and 11/11/2021 without documented ADRs. ?2.) Tolerates Augmentin per husband Joe  ? Sulfa Antibiotics Rash  ? ?  ? ?  ?Medication List  ?  ? ?  ? Accurate as of March 28, 2022  4:19 PM. If you have any questions, ask your nurse or doctor.  ?  ?  ? ?  ? ?STOP taking these medications   ? ?oxyCODONE 5 MG immediate release tablet ?Commonly known as: Roxicodone ?Stopped by: SDebroah Loop PA-C ?  ? ?  ? ?TAKE these medications   ? ?acetaminophen 325 MG tablet ?Commonly known as: TYLENOL ?Take 2 tablets (650 mg total) by mouth every 6 (six) hours as needed for mild pain (or Fever >/= 101). ?  ?  buPROPion 150 MG 24 hr tablet ?Commonly known as: WELLBUTRIN XL ?Take 150 mg by mouth daily. ?  ?busPIRone 15 MG tablet ?Commonly known as: BUSPAR ?Take 15 mg by mouth 2 (two) times daily. ?  ?cyclobenzaprine 10 MG tablet ?Commonly known as: FLEXERIL ?Take 10 mg by mouth 3 (three) times daily as needed for muscle spasms. ?  ?DULoxetine 60 MG capsule ?Commonly known as: CYMBALTA ?Take 60 mg by mouth daily. ?  ?EPINEPHrine 0.3 mg/0.3 mL Soaj injection ?Commonly known as: EPI-PEN ?Inject 0.3 mg into the muscle once as needed (if patient exhibits significant signs and symptoms of allergic reaction.). ?  ?fluticasone 50 MCG/ACT nasal  spray ?Commonly known as: FLONASE ?Place 1 spray into both nostrils daily as needed for rhinitis or allergies. ?  ?gabapentin 600 MG tablet ?Commonly known as: NEURONTIN ?Take 600 mg by mouth daily. ?  ?levonorgestrel 20 MCG/24HR IUD ?Commonly known as: MIRENA ?1 each by Intrauterine route once. ?  ?Linzess 145 MCG Caps capsule ?Generic drug: linaclotide ?Take 145 mcg by mouth daily as needed (Constipation). ?  ?losartan 50 MG tablet ?Commonly known as: COZAAR ?Take 50 mg by mouth daily. ?  ?omeprazole 20 MG capsule ?Commonly known as: PRILOSEC ?Take 20 mg by mouth daily. ?  ?propranolol 40 MG tablet ?Commonly known as: INDERAL ?Take 40 mg by mouth at bedtime. ?  ?Saxenda 18 MG/3ML Sopn ?Generic drug: Liraglutide -Weight Management ?Inject 0.6 mg into the skin daily. ?  ?trimethoprim 100 MG tablet ?Commonly known as: TRIMPEX ?Take 100 mg by mouth 2 (two) times daily. ?  ?zolpidem 5 MG tablet ?Commonly known as: AMBIEN ?Take 2 tablets (10 mg total) by mouth at bedtime. ?  ? ?  ? ? ?Allergies:  ?Allergies  ?Allergen Reactions  ? Cefuroxime Shortness Of Breath  ?  1.) Tolerated 1st generation cephalosporin (CEPHALEXIN) courses on 10/17/2021 and 11/11/2021 without documented ADRs. ?2.) Tolerates Augmentin per husband Joe  ? Sulfa Antibiotics Rash  ? ? ?Family History: ?Family History  ?Problem Relation Age of Onset  ? Hypertension Mother   ? ? ?Social History:  ? reports that she has never smoked. She has never used smokeless tobacco. She reports that she does not currently use alcohol. She reports that she does not use drugs. ? ?Physical Exam: ?BP 131/85   Pulse 84   Ht '5\' 5"'$  (1.651 m)   Wt 150 lb (68 kg)   BMI 24.96 kg/m?   ?Constitutional:  Alert and oriented, no acute distress, nontoxic appearing ?HEENT: [place], AT ?Cardiovascular: No clubbing, cyanosis, or edema ?Respiratory: Normal respiratory effort, no increased work of breathing ?Skin: No rashes, bruises or suspicious lesions ?Neurologic: Grossly intact, no  focal deficits, moving all 4 extremities ?Psychiatric: Normal mood and affect ? ?Laboratory Data: ?Results for orders placed or performed in visit on 03/28/22  ?Microscopic Examination  ? Urine  ?Result Value Ref Range  ? WBC, UA 0-5 0 - 5 /hpf  ? RBC 0-2 0 - 2 /hpf  ? Epithelial Cells (non renal) 0-10 0 - 10 /hpf  ? Casts Present (A) None seen /lpf  ? Cast Type Granular casts (A) N/A  ? Bacteria, UA None seen None seen/Few  ?Urinalysis, Complete  ?Result Value Ref Range  ? Specific Gravity, UA 1.015 1.005 - 1.030  ? pH, UA 6.0 5.0 - 7.5  ? Color, UA Yellow Yellow  ? Appearance Ur Clear Clear  ? Leukocytes,UA Trace (A) Negative  ? Protein,UA Negative Negative/Trace  ? Glucose, UA Negative  Negative  ? Ketones, UA Negative Negative  ? RBC, UA Negative Negative  ? Bilirubin, UA Negative Negative  ? Urobilinogen, Ur 0.2 0.2 - 1.0 mg/dL  ? Nitrite, UA Negative Negative  ? Microscopic Examination See below:   ?Bladder Scan (Post Void Residual) in office  ?Result Value Ref Range  ? Scan Result 0   ? ?Assessment & Plan:   ?1. Dysuria ?UA benign today and she is emptying appropriately.  Will defer antibiotics and send for culture for further evaluation.  We discussed return precautions including fever, chills, nausea, or vomiting.  Patient to continue trimethoprim in the interim.  She expressed understanding. ?- Urinalysis, Complete ?- CULTURE, URINE COMPREHENSIVE ?- Bladder Scan (Post Void Residual) in office ? ?Return for Will call with results. ? ?Debroah Loop, PA-C ? ?[facility] ?[address] ?[place], [place] [address] ?[phone] ?   ?

## 2022-03-31 LAB — CULTURE, URINE COMPREHENSIVE

## 2022-04-12 ENCOUNTER — Ambulatory Visit
Admission: RE | Admit: 2022-04-12 | Discharge: 2022-04-12 | Disposition: A | Discharge status: Home / Self Care | Source: Ambulatory Visit | Attending: Surgery | Admitting: Surgery

## 2022-04-12 DIAGNOSIS — Z9889 Other specified postprocedural states: Secondary | ICD-10-CM | POA: Diagnosis present

## 2022-04-12 DIAGNOSIS — M75112 Incomplete rotator cuff tear or rupture of left shoulder, not specified as traumatic: Secondary | ICD-10-CM | POA: Diagnosis present

## 2022-04-17 ENCOUNTER — Other Ambulatory Visit
Admission: RE | Admit: 2022-04-17 | Discharge: 2022-04-17 | Disposition: A | Discharge status: Home / Self Care | Payer: Federal, State, Local not specified - PPO | Source: Ambulatory Visit | Attending: Sports Medicine | Admitting: Sports Medicine

## 2022-04-17 DIAGNOSIS — M25512 Pain in left shoulder: Secondary | ICD-10-CM | POA: Diagnosis present

## 2022-04-17 DIAGNOSIS — G8929 Other chronic pain: Secondary | ICD-10-CM | POA: Insufficient documentation

## 2022-04-17 LAB — SYNOVIAL CELL COUNT + DIFF, W/ CRYSTALS
Crystals, Fluid: NONE SEEN
Eosinophils-Synovial: 0 %
Lymphocytes-Synovial Fld: 59 %
Monocyte-Macrophage-Synovial Fluid: 36 %
Neutrophil, Synovial: 5 %
WBC, Synovial: 622 /mm3 — ABNORMAL HIGH (ref 0–200)

## 2022-04-24 ENCOUNTER — Other Ambulatory Visit: Payer: Self-pay | Admitting: Surgery

## 2022-04-24 DIAGNOSIS — M4727 Other spondylosis with radiculopathy, lumbosacral region: Secondary | ICD-10-CM

## 2022-04-24 DIAGNOSIS — M4807 Spinal stenosis, lumbosacral region: Secondary | ICD-10-CM

## 2022-05-07 ENCOUNTER — Other Ambulatory Visit: Payer: Self-pay | Admitting: Physician Assistant

## 2022-05-08 ENCOUNTER — Ambulatory Visit
Admission: RE | Admit: 2022-05-08 | Discharge: 2022-05-08 | Disposition: A | Discharge status: Home / Self Care | Payer: Federal, State, Local not specified - PPO | Source: Ambulatory Visit | Attending: Surgery | Admitting: Surgery

## 2022-05-08 DIAGNOSIS — M4727 Other spondylosis with radiculopathy, lumbosacral region: Secondary | ICD-10-CM | POA: Insufficient documentation

## 2022-05-08 DIAGNOSIS — M4807 Spinal stenosis, lumbosacral region: Secondary | ICD-10-CM | POA: Diagnosis not present

## 2022-05-15 DIAGNOSIS — R87618 Other abnormal cytological findings on specimens from cervix uteri: Secondary | ICD-10-CM | POA: Insufficient documentation

## 2022-05-15 DIAGNOSIS — R7303 Prediabetes: Secondary | ICD-10-CM | POA: Insufficient documentation

## 2022-05-15 DIAGNOSIS — M4807 Spinal stenosis, lumbosacral region: Secondary | ICD-10-CM | POA: Insufficient documentation

## 2022-05-15 DIAGNOSIS — N1831 Chronic kidney disease, stage 3a: Secondary | ICD-10-CM | POA: Insufficient documentation

## 2022-06-09 ENCOUNTER — Other Ambulatory Visit: Payer: Self-pay | Admitting: Physician Assistant

## 2022-06-12 DIAGNOSIS — G950 Syringomyelia and syringobulbia: Secondary | ICD-10-CM | POA: Insufficient documentation

## 2022-06-12 DIAGNOSIS — S139XXA Sprain of joints and ligaments of unspecified parts of neck, initial encounter: Secondary | ICD-10-CM | POA: Insufficient documentation

## 2022-06-14 ENCOUNTER — Other Ambulatory Visit: Payer: Self-pay | Admitting: Surgery

## 2022-06-14 DIAGNOSIS — M5412 Radiculopathy, cervical region: Secondary | ICD-10-CM

## 2022-06-14 DIAGNOSIS — M47812 Spondylosis without myelopathy or radiculopathy, cervical region: Secondary | ICD-10-CM

## 2022-06-14 DIAGNOSIS — G950 Syringomyelia and syringobulbia: Secondary | ICD-10-CM

## 2022-06-16 NOTE — Progress Notes (Deleted)
Psychiatric Initial Adult Assessment   Patient Identification: Natasha Estrada MRN:  [id number] Date of Evaluation:  06/16/2022 Referral Source: *** Chief Complaint:  No chief complaint on file.  Visit Diagnosis: No diagnosis found.  History of Present Illness:   Natasha Estrada is a 51 y.o. year old female with a history of depression, anxiety, chronic back pain, hypertenstion, Roux-en0Y in 2004, who is referred for depression.     Daily routine: Diet:  Exercise: Support: Household:  Marital status: Number of children: Employment:  Education:   Last PCP / ongoing medical evaluation:      Associated Signs/Symptoms: Depression Symptoms:  {DEPRESSION SYMPTOMS:20000} (Hypo) Manic Symptoms:  {BHH MANIC SYMPTOMS:22872} Anxiety Symptoms:  {BHH ANXIETY SYMPTOMS:22873} Psychotic Symptoms:  {BHH PSYCHOTIC SYMPTOMS:22874} PTSD Symptoms: {BHH PTSD SYMPTOMS:22875}  Past Psychiatric History:  Outpatient:  Psychiatry admission:  Previous suicide attempt:  Past trials of medication:  History of violence:    Previous Psychotropic Medications: {YES/NO:21197}  Substance Abuse History in the last 12 months:  {yes no:314532}  Consequences of Substance Abuse: {BHH CONSEQUENCES OF SUBSTANCE ABUSE:22880}  Past Medical History:  Past Medical History:  Diagnosis Date   Anxiety    Arthritis    Chronic low back pain with left-sided sciatica    Chronic pain syndrome    Depression    Fibroid    GERD (gastroesophageal reflux disease)    History of kidney stones    Hypertension    IBS (irritable bowel syndrome)    with constipation   Insomnia    Kidney lesion, native, left 09/2020   following renal mass protocol on 10/14/20   Kidney stone    Lumbar radiculopathy    Radiculitis, cervical    c7-c8   Sleep apnea    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 on daily antibix treatment    Past Surgical History:  Procedure Laterality Date    BLADDER STONE REMOVAL  2018   CERVICAL CONE BIOPSY  2018   CHOLECYSTECTOMY     GASTRIC ROUX-EN-Y  2013   KYPHOPLASTY N/A 12/07/2020   Procedure: L2 Kyphoplasty;  Surgeon: Hessie Knows, MD;  Location: [facility];  Service: Orthopedics;  Laterality: N/A;   ROTATOR CUFF REPAIR Left 2010   SHOULDER ARTHROSCOPY WITH SUBACROMIAL DECOMPRESSION AND OPEN ROTATOR C Left 10/19/2020   Procedure: LEFT SHOULDER ARTHROSCOPY WITH DEBRIDEMENT DECOMPRESSION AND POSSIBLE BICEP TENODESIS;  Surgeon: Corky Mull, MD;  Location: [facility];  Service: Orthopedics;  Laterality: Left;   SHOULDER ARTHROSCOPY WITH SUBACROMIAL DECOMPRESSION AND OPEN ROTATOR C Left 12/08/2021   Procedure: Left shoulder arthroscopy with debridement, decompression, and rotator cuff repair;  Surgeon: Corky Mull, MD;  Location: [facility];  Service: Orthopedics;  Laterality: Left;   TONSILLECTOMY     as a child   transforaminal epidural steroid injection Left 10/01/2020   C6-7    Family Psychiatric History: ***  Family History:  Family History  Problem Relation Age of Onset   Hypertension Mother     Social History:   Social History   Socioeconomic History   Marital status: Married    Spouse name: Joe   Number of children: Not on file   Years of education: Not on file   Highest education level: Not on file  Occupational History    Comment: workers comp for shoulder  Tobacco Use   Smoking status: Never   Smokeless tobacco: Never  Vaping Use   Vaping Use: Never used  Substance and  Sexual Activity   Alcohol use: Not Currently   Drug use: Never   Sexual activity: Yes    Birth control/protection: I.U.D.  Other Topics Concern   Not on file  Social History Narrative   Patient lives with husband. Feels safe in her home.   Having difficulty completing her job while at work d/t inability to use shoulder.   Injury initially from work (workers comp)   Social Determinants of Radio broadcast assistant Strain: Not on file   Food Insecurity: Not on file  Transportation Needs: Not on file  Physical Activity: Not on file  Stress: Not on file  Social Connections: Not on file    Additional Social History: ***  Allergies:   Allergies  Allergen Reactions   Cefuroxime Shortness Of Breath    1.) Tolerated 1st generation cephalosporin (CEPHALEXIN) courses on 10/17/2021 and 11/11/2021 without documented ADRs. 2.) Tolerates Augmentin per husband Joe   Sulfa Antibiotics Rash    Metabolic Disorder Labs: No results found for: "HGBA1C", "MPG" No results found for: "PROLACTIN" No results found for: "CHOL", "TRIG", "HDL", "CHOLHDL", "VLDL", "LDLCALC" Lab Results  Component Value Date   TSH 1.434 04/28/2020    Therapeutic Level Labs: No results found for: "LITHIUM" No results found for: "CBMZ" No results found for: "VALPROATE"  Current Medications: Current Outpatient Medications  Medication Sig Dispense Refill   acetaminophen (TYLENOL) 325 MG tablet Take 2 tablets (650 mg total) by mouth every 6 (six) hours as needed for mild pain (or Fever >/= 101).     buPROPion (WELLBUTRIN XL) 150 MG 24 hr tablet Take 150 mg by mouth daily.      busPIRone (BUSPAR) 15 MG tablet Take 15 mg by mouth 2 (two) times daily.     cyclobenzaprine (FLEXERIL) 10 MG tablet Take 10 mg by mouth 3 (three) times daily as needed for muscle spasms.     DULoxetine (CYMBALTA) 60 MG capsule Take 60 mg by mouth daily.     EPINEPHrine 0.3 mg/0.3 mL IJ SOAJ injection Inject 0.3 mg into the muscle once as needed (if patient exhibits significant signs and symptoms of allergic reaction.). 1 each 0   fluticasone (FLONASE) 50 MCG/ACT nasal spray Place 1 spray into both nostrils daily as needed for rhinitis or allergies.     gabapentin (NEURONTIN) 600 MG tablet Take 600 mg by mouth daily.     levonorgestrel (MIRENA) 20 MCG/24HR IUD 1 each by Intrauterine route once.     LINZESS 145 MCG CAPS capsule Take 145 mcg by mouth daily as needed (Constipation).      losartan (COZAAR) 50 MG tablet Take 50 mg by mouth daily.     omeprazole (PRILOSEC) 20 MG capsule Take 20 mg by mouth daily.     propranolol (INDERAL) 40 MG tablet Take 40 mg by mouth at bedtime.     SAXENDA 18 MG/3ML SOPN Inject 0.6 mg into the skin daily.     trimethoprim (TRIMPEX) 100 MG tablet TAKE 1 TABLET(100 MG) BY MOUTH DAILY 30 tablet 11   zolpidem (AMBIEN) 5 MG tablet Take 2 tablets (10 mg total) by mouth at bedtime.     No current facility-administered medications for this visit.    Musculoskeletal: Strength & Muscle Tone: within normal limits Gait & Station: normal Patient leans: N/A  Psychiatric Specialty Exam: Review of Systems  There were no vitals taken for this visit.There is no height or weight on file to calculate BMI.  General Appearance: {Appearance:22683}  Eye Contact:  {  [place] CONTACT:22684}  Speech:  Clear and Coherent  Volume:  Normal  Mood:  {BHH MOOD:22306}  Affect:  {Affect (PAA):22687}  Thought Process:  Coherent  Orientation:  Full (Time, Place, and Person)  Thought Content:  Logical  Suicidal Thoughts:  {ST/HT (PAA):22692}  Homicidal Thoughts:  {ST/HT (PAA):22692}  Memory:  Immediate;   Good  Judgement:  {Judgement (PAA):22694}  Insight:  {Insight (PAA):22695}  Psychomotor Activity:  Normal  Concentration:  Concentration: Good and Attention Span: Good  Recall:  Good  Fund of Knowledge:Good  Language: Good  Akathisia:  No  Handed:  Right  AIMS (if indicated):  not done  Assets:  Communication Skills Desire for Improvement  ADL's:  Intact  Cognition: WNL  Sleep:  {BHH GOOD/FAIR/POOR:22877}   Screenings: Flowsheet Row Admission (Discharged) from 12/08/2021 in [facility] from 11/30/2021 in [place] ED to Hosp-Admission (Discharged) from 10/15/2021 in [place] [facility]  C-SSRS RISK CATEGORY No Risk No Risk No Risk        Assessment and Plan:  Natasha Estrada is a 51 y.o. year old female with a history of , who presents for follow up appointment for below.   Assessment  Plan   The patient demonstrates the following risk factors for suicide: Chronic risk factors for suicide include: {Chronic Risk Factors for IRCVELF:81017510}. Acute risk factors for suicide include: {Acute Risk Factors for CHENIDP:82423536}. Protective factors for this patient include: {Protective Factors for Suicide RWER:15400867}. Considering these factors, the overall suicide risk at this point appears to be {Desc; low/moderate/high:110033}. Patient {ACTION; IS/IS YPP:50932671} appropriate for outpatient follow up.      Collaboration of Care: {BH OP Collaboration of Care:21014065}  Patient/Guardian was advised Release of Information must be obtained prior to any record release in order to collaborate their care with an outside provider. Patient/Guardian was advised if they have not already done so to contact the registration department to sign all necessary forms in order for [name] to release information regarding their care.   Consent: Patient/Guardian gives verbal consent for treatment and assignment of benefits for services provided during this visit. Patient/Guardian expressed understanding and agreed to proceed.   Norman Clay, MD 7/14/202310:49 AM

## 2022-06-18 ENCOUNTER — Ambulatory Visit
Admission: RE | Admit: 2022-06-18 | Discharge: 2022-06-18 | Disposition: A | Discharge status: Home / Self Care | Payer: Federal, State, Local not specified - PPO | Source: Ambulatory Visit | Attending: Surgery | Admitting: Surgery

## 2022-06-18 DIAGNOSIS — M47812 Spondylosis without myelopathy or radiculopathy, cervical region: Secondary | ICD-10-CM | POA: Insufficient documentation

## 2022-06-18 DIAGNOSIS — M5412 Radiculopathy, cervical region: Secondary | ICD-10-CM | POA: Diagnosis present

## 2022-06-18 DIAGNOSIS — G950 Syringomyelia and syringobulbia: Secondary | ICD-10-CM

## 2022-06-20 ENCOUNTER — Ambulatory Visit: Payer: BC Managed Care – PPO | Admitting: Psychiatry
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
## 2022-08-03 ENCOUNTER — Encounter: Payer: Self-pay | Admitting: Psychiatry

## 2022-08-03 ENCOUNTER — Ambulatory Visit (INDEPENDENT_AMBULATORY_CARE_PROVIDER_SITE_OTHER): Payer: Self-pay | Admitting: Psychiatry

## 2022-08-03 VITALS — BP 141/84 | HR 75 | Temp 98.1°F | Wt 155.4 lb

## 2022-08-03 DIAGNOSIS — S5000XA Contusion of unspecified elbow, initial encounter: Secondary | ICD-10-CM | POA: Insufficient documentation

## 2022-08-03 DIAGNOSIS — F332 Major depressive disorder, recurrent severe without psychotic features: Secondary | ICD-10-CM

## 2022-08-03 DIAGNOSIS — M25519 Pain in unspecified shoulder: Secondary | ICD-10-CM | POA: Insufficient documentation

## 2022-08-03 DIAGNOSIS — M25669 Stiffness of unspecified knee, not elsewhere classified: Secondary | ICD-10-CM | POA: Insufficient documentation

## 2022-08-03 DIAGNOSIS — R609 Edema, unspecified: Secondary | ICD-10-CM | POA: Insufficient documentation

## 2022-08-03 DIAGNOSIS — M6281 Muscle weakness (generalized): Secondary | ICD-10-CM | POA: Insufficient documentation

## 2022-08-03 DIAGNOSIS — S40019A Contusion of unspecified shoulder, initial encounter: Secondary | ICD-10-CM | POA: Insufficient documentation

## 2022-08-03 DIAGNOSIS — M75120 Complete rotator cuff tear or rupture of unspecified shoulder, not specified as traumatic: Secondary | ICD-10-CM | POA: Insufficient documentation

## 2022-08-03 DIAGNOSIS — F4310 Post-traumatic stress disorder, unspecified: Secondary | ICD-10-CM

## 2022-08-03 DIAGNOSIS — S239XXA Sprain of unspecified parts of thorax, initial encounter: Secondary | ICD-10-CM | POA: Insufficient documentation

## 2022-08-03 NOTE — Patient Instructions (Signed)
Continue  duloxetine 60 mg daily Increase bupropion 300 mg daily Continue buspirone 15 mg twice a day Continue propranolol 40 mg daily Continue Ambien 10 mg at night as needed for insomnia Next appointment: 10/23 at 10 AM

## 2022-08-09 ENCOUNTER — Emergency Department: Payer: Federal, State, Local not specified - PPO

## 2022-08-09 ENCOUNTER — Emergency Department
Admission: EM | Admit: 2022-08-09 | Discharge: 2022-08-10 | Disposition: A | Discharge status: Home / Self Care | Payer: Federal, State, Local not specified - PPO | Attending: Emergency Medicine | Admitting: Emergency Medicine

## 2022-08-09 ENCOUNTER — Encounter: Payer: Self-pay | Admitting: Emergency Medicine

## 2022-08-09 DIAGNOSIS — N201 Calculus of ureter: Secondary | ICD-10-CM

## 2022-08-09 DIAGNOSIS — R109 Unspecified abdominal pain: Secondary | ICD-10-CM | POA: Diagnosis present

## 2022-08-09 DIAGNOSIS — N23 Unspecified renal colic: Secondary | ICD-10-CM

## 2022-08-09 LAB — COMPREHENSIVE METABOLIC PANEL
ALT: 19 U/L (ref 0–44)
AST: 31 U/L (ref 15–41)
Albumin: 4.5 g/dL (ref 3.5–5.0)
Alkaline Phosphatase: 65 U/L (ref 38–126)
Anion gap: 7 (ref 5–15)
BUN: 26 mg/dL — ABNORMAL HIGH (ref 6–20)
CO2: 23 mmol/L (ref 22–32)
Calcium: 8.9 mg/dL (ref 8.9–10.3)
Chloride: 106 mmol/L (ref 98–111)
Creatinine, Ser: 1.25 mg/dL — ABNORMAL HIGH (ref 0.44–1.00)
GFR, Estimated: 52 mL/min — ABNORMAL LOW (ref >60)
Glucose, Bld: 129 mg/dL — ABNORMAL HIGH (ref 70–99)
Potassium: 3.9 mmol/L (ref 3.5–5.1)
Sodium: 136 mmol/L (ref 135–145)
Total Bilirubin: 0.8 mg/dL (ref 0.3–1.2)
Total Protein: 7.3 g/dL (ref 6.5–8.1)

## 2022-08-09 LAB — CBC
HCT: 33.4 % — ABNORMAL LOW (ref 36.0–46.0)
Hemoglobin: 10.9 g/dL — ABNORMAL LOW (ref 12.0–15.0)
MCH: 28.7 pg (ref 26.0–34.0)
MCHC: 32.6 g/dL (ref 30.0–36.0)
MCV: 87.9 fL (ref 80.0–100.0)
Platelets: 274 10*3/uL (ref 150–400)
RBC: 3.8 MIL/uL — ABNORMAL LOW (ref 3.87–5.11)
RDW: 12.9 % (ref 11.5–15.5)
WBC: 5.5 10*3/uL (ref 4.0–10.5)
nRBC: 0 % (ref 0.0–0.2)

## 2022-08-09 LAB — URINALYSIS, ROUTINE W REFLEX MICROSCOPIC
Bilirubin Urine: NEGATIVE
Glucose, UA: NEGATIVE mg/dL
Ketones, ur: NEGATIVE mg/dL
Nitrite: NEGATIVE
Protein, ur: 100 mg/dL — AB
RBC / HPF: >50 RBC/hpf — ABNORMAL HIGH (ref 0–5)
Specific Gravity, Urine: 1.02 (ref 1.005–1.030)
pH: 5 (ref 5.0–8.0)

## 2022-08-09 LAB — POC URINE PREG, ED: Preg Test, Ur: NEGATIVE

## 2022-08-09 LAB — TROPONIN I (HIGH SENSITIVITY): Troponin I (High Sensitivity): <2 ng/L (ref <18)

## 2022-08-09 LAB — ETHANOL: Alcohol, Ethyl (B): <10 mg/dL (ref <10)

## 2022-08-09 LAB — LIPASE, BLOOD: Lipase: 34 U/L (ref 11–51)

## 2022-08-09 NOTE — ED Provider Triage Note (Signed)
Emergency Medicine Provider Triage Evaluation Note  Natasha Estrada , a 51 y.o. female  was evaluated in triage.  Pt complains of llq pain and left flank pain. Reports that she has been constipated. Took 6 stool softeners. Had gastric bypass in 2004. Also had two ticks on her yesterday. +Nausea, no vomiting. Feels abdominal distention. 131mg fentatnyl and 4 zofran IV with EMS. No chest pain. No dysuria/hematuria.  Review of Systems  Positive: Abd pain, flank pain, constipation Negative: CP/SOB  Physical Exam  There were no vitals taken for this visit. Gen:   Awake, no distress   Resp:  Normal effort  MSK:   Moves extremities without difficulty  Other:    Medical Decision Making  Medically screening exam initiated at 8:11 PM.  Appropriate orders placed.  KMalyssa Mariswas informed that the remainder of the evaluation will be completed by another provider, this initial triage assessment does not replace that evaluation, and the importance of remaining in the ED until their evaluation is complete.     PMarquette Old PA-C 08/09/22 2015

## 2022-08-09 NOTE — ED Triage Notes (Signed)
Pt arrived via [facility] from home with c/o LLQ abd since this AM that pt pt describes as a 10/10 stabbing pain that radiates into her back. Pt sts she has been constipated x1 day and took 6 stool softeners 30 mins prior to arrival to ED but pt sts that is normal for her. Pt also reports removing 2 ticks from herself on 9/5. Pt received 141mg Fentanyl and '4mg'$  Zofran IV in route by EMS with report of no relief. Pt in triage not willing to sit up in wheelchair and sts she cant due to her stomach exploding.   Hx/o gastric bypass in 2004 as well as numerous surgeries to left shoulder, back and neck in past that require her to take '600mg'$  Gabapentin and muscle relaxer's daily.

## 2022-08-10 MED ORDER — ONDANSETRON 4 MG PO TBDP: 4.0000 mg | ORAL_TABLET | Freq: Three times a day (TID) | ORAL | 0 refills | Status: DC | PRN

## 2022-08-10 MED ORDER — OXYCODONE HCL 5 MG PO TABS: 5.0000 mg | ORAL_TABLET | Freq: Three times a day (TID) | ORAL | 0 refills | Status: DC | PRN

## 2022-08-10 MED ORDER — HYDROMORPHONE HCL 1 MG/ML IJ SOLN
1.0000 mg | Freq: Once | INTRAMUSCULAR | Status: AC
  Administered 2022-08-10: 1 mg via INTRAVENOUS
  Filled 2022-08-10: qty 1

## 2022-08-10 MED ORDER — ACETAMINOPHEN 500 MG PO TABS
1000.0000 mg | ORAL_TABLET | Freq: Once | ORAL | Status: AC
  Administered 2022-08-10: 1000 mg via ORAL
  Filled 2022-08-10: qty 2

## 2022-08-10 MED ORDER — ONDANSETRON HCL 4 MG/2ML IJ SOLN
4.0000 mg | Freq: Once | INTRAMUSCULAR | Status: AC
  Administered 2022-08-10: 4 mg via INTRAVENOUS
  Filled 2022-08-10: qty 2

## 2022-08-10 MED ORDER — LACTATED RINGERS IV BOLUS
1000.0000 mL | Freq: Once | INTRAVENOUS | Status: AC
  Administered 2022-08-10: 1000 mL via INTRAVENOUS

## 2022-08-10 MED ORDER — OXYCODONE HCL 5 MG PO TABS
5.0000 mg | ORAL_TABLET | Freq: Once | ORAL | Status: AC
  Administered 2022-08-10: 5 mg via ORAL
  Filled 2022-08-10: qty 1

## 2022-08-10 NOTE — H&P (View-Only) (Signed)
08/11/2022 2:48 PM   Natasha Estrada 23-Feb-1971 [id number]  Referring provider: Wayland Denis, PA-C [address] [place],  [place] [address]  Chief Complaint  Patient presents with   Nephrolithiasis    HPI: 51 year old female with a personal history of nephrolithiasis who returns today to the office to discuss management for 1.3 left proximal UPJ stone.  She was seen in the emergency room yesterday with acute onset left flank pain.  She was found to have obstructing left UPJ stone measuring 1.5 cm which was previously nonobstructing within the kidney.  Her labs are fairly unremarkable.  Her urinalysis was fairly bland primarily with RBCs and a few WBCs.  Her creatinine was 1.25.  Her pain was able to be quickly controlled and she was scheduled for prompt outpatient follow-up with [name].  Stone to skin distance approximately 11 cm.  Hounsfield units approximately 750.  She does have a personal history of nephrolithiasis.  She has had shockwave x2 before as well as ureteroscopy.  She reports today that the shockwave was successful once but none other time.  She had a very hard time tolerating the stent on previous ureteroscopy.  She continues to have left flank pain.  She denies any fevers or chills.  She feels bloated and is constipated.   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 on daily antibix treatment    Surgical  History: Past Surgical History:  Procedure Laterality Date   BLADDER STONE REMOVAL  2018   CERVICAL CONE BIOPSY  2018   CHOLECYSTECTOMY     GASTRIC ROUX-EN-Y  2013   KYPHOPLASTY N/A 12/07/2020   Procedure: L2 Kyphoplasty;  Surgeon: Hessie Knows, MD;  Location: [facility];  Service: Orthopedics;  Laterality: N/A;   ROTATOR CUFF REPAIR Left 2010   SHOULDER ARTHROSCOPY WITH SUBACROMIAL DECOMPRESSION AND OPEN ROTATOR C Left 10/19/2020   Procedure: LEFT SHOULDER ARTHROSCOPY WITH DEBRIDEMENT DECOMPRESSION AND POSSIBLE BICEP TENODESIS;  Surgeon: Corky Mull, MD;  Location: [facility];  Service: Orthopedics;  Laterality: Left;   SHOULDER ARTHROSCOPY WITH SUBACROMIAL DECOMPRESSION AND OPEN ROTATOR C Left 12/08/2021   Procedure: Left shoulder arthroscopy with debridement, decompression, and rotator cuff repair;  Surgeon: Corky Mull, MD;  Location: [facility];  Service: Orthopedics;  Laterality: Left;   TONSILLECTOMY     as a child   transforaminal epidural steroid injection Left 10/01/2020   C6-7    Home Medications:  Allergies as of 08/11/2022       Reactions   Cefuroxime Shortness Of Breath   1.) Tolerated 1st generation cephalosporin (CEPHALEXIN) courses on 10/17/2021 and 11/11/2021 without documented ADRs. 2.) Tolerates Augmentin per husband Joe   Sulfa Antibiotics Rash        Medication List        Accurate as of  August 11, 2022  2:48 PM. If you have any questions, ask your nurse or doctor.          acetaminophen 325 MG tablet Commonly known as: TYLENOL Take 2 tablets (650 mg total) by mouth every 6 (six) hours as needed for mild pain (or Fever >/= 101).   alendronate 70 MG tablet Commonly known as: FOSAMAX Take 70 mg by mouth once a week.   atorvastatin 20 MG tablet Commonly known as: LIPITOR Take 20 mg by mouth daily.   buPROPion 150 MG 24 hr tablet Commonly known as: WELLBUTRIN XL Take 150 mg by mouth daily.   busPIRone 15 MG tablet Commonly known as:  BUSPAR Take 15 mg by mouth 2 (two) times daily.   cyclobenzaprine 10 MG tablet Commonly known as: FLEXERIL Take 10 mg by mouth 3 (three) times daily as needed for muscle spasms.   DULoxetine 60 MG capsule Commonly known as: CYMBALTA Take 60 mg by mouth daily.   EPINEPHrine 0.3 mg/0.3 mL Soaj injection Commonly known as: EPI-PEN Inject 0.3 mg into the muscle once as needed (if patient exhibits significant signs and symptoms of allergic reaction.).   fluticasone 50 MCG/ACT nasal spray Commonly known as: FLONASE Place 1 spray into both nostrils daily as needed for rhinitis or allergies.   gabapentin 600 MG tablet Commonly known as: NEURONTIN Take 600 mg by mouth daily.   hydrOXYzine 25 MG capsule Commonly known as: VISTARIL Take 25 mg by mouth 3 (three) times daily.   lidocaine 5 % Commonly known as: LIDODERM APPLY 1 TO 2 PATCHES TOPICALLY TO THE SKIN EVERY DAY. MAY WEAR UP TO 12 HOURS   losartan 50 MG tablet Commonly known as: COZAAR Take 50 mg by mouth daily.   omeprazole 20 MG capsule Commonly known as: PRILOSEC Take 20 mg by mouth daily.   ondansetron 4 MG disintegrating tablet Commonly known as: ZOFRAN-ODT Take 1 tablet (4 mg total) by mouth every 8 (eight) hours as needed.   oxyCODONE 5 MG immediate release tablet Commonly known as: Roxicodone Take 1 tablet (5 mg total) by mouth every 8 (eight) hours as needed.   propranolol 40 MG tablet Commonly known as: INDERAL Take 40 mg by mouth at bedtime.   tiZANidine 4 MG tablet Commonly known as: ZANAFLEX Take 4 mg by mouth 3 (three) times daily.   traMADol 50 MG tablet Commonly known as: ULTRAM Take 50 mg by mouth 2 (two) times daily as needed.   trimethoprim 100 MG tablet Commonly known as: TRIMPEX TAKE 1 TABLET(100 MG) BY MOUTH DAILY   zolpidem 10 MG tablet Commonly known as: AMBIEN Take 10 mg by mouth daily.        Allergies:  Allergies  Allergen Reactions   Cefuroxime Shortness Of Breath     1.) Tolerated 1st generation cephalosporin (CEPHALEXIN) courses on 10/17/2021 and 11/11/2021 without documented ADRs. 2.) Tolerates Augmentin per husband Joe   Sulfa Antibiotics Rash    Family History: Family History  Problem Relation Age of Onset   Hypertension Mother     Social History:  reports that she has never smoked. She has never used smokeless tobacco. She reports that she does not currently use alcohol. She reports that she does not use drugs.   Physical Exam: BP 118/80   Pulse 78   Ht '5\' 5"'$  (1.651 m)   Wt 156 lb (70.8 kg)   LMP  (LMP Unknown)   BMI 25.96 kg/m   Constitutional:  Alert and oriented, No  acute distress. HEENT: [place] AT, moist mucus membranes.  Trachea midline, no masses. Cardiovascular: No clubbing, cyanosis, or edema. Respiratory: Normal respiratory effort, no increased work of breathing. GI: Abdomen is soft, nontender, nondistended, no abdominal masses GU: No CVA tenderness Skin: No rashes, bruises or suspicious lesions. Neurologic: Grossly intact, no focal deficits, moving all 4 extremities. Psychiatric: Normal mood and affect.  Laboratory Data: Lab Results  Component Value Date   WBC 5.5 08/09/2022   HGB 10.9 (L) 08/09/2022   HCT 33.4 (L) 08/09/2022   MCV 87.9 08/09/2022   PLT 274 08/09/2022    Lab Results  Component Value Date   CREATININE 1.25 (H) 08/09/2022     Urinalysis    Component Value Date/Time   COLORURINE YELLOW (A) 08/09/2022 2025   APPEARANCEUR HAZY (A) 08/09/2022 2025   APPEARANCEUR Clear 03/28/2022 1531   LABSPEC 1.020 08/09/2022 2025   PHURINE 5.0 08/09/2022 2025   GLUCOSEU NEGATIVE 08/09/2022 2025   HGBUR LARGE (A) 08/09/2022 2025   BILIRUBINUR NEGATIVE 08/09/2022 2025   BILIRUBINUR Negative 03/28/2022 1531   KETONESUR NEGATIVE 08/09/2022 2025   PROTEINUR 100 (A) 08/09/2022 2025   UROBILINOGEN 0.2 10/31/2019 1323   NITRITE NEGATIVE 08/09/2022 2025   LEUKOCYTESUR SMALL (A) 08/09/2022 2025    Lab Results   Component Value Date   LABMICR See below: 03/28/2022   WBCUA 0-5 03/28/2022   LABEPIT 0-10 03/28/2022   MUCUS Present (A) 01/26/2022   BACTERIA RARE (A) 08/09/2022    Pertinent Imaging: CT Renal Stone Study  Narrative CLINICAL DATA:  Flank pain.  Concern for kidney stone.  EXAM: CT ABDOMEN AND PELVIS WITHOUT CONTRAST  TECHNIQUE: Multidetector CT imaging of the abdomen and pelvis was performed following the standard protocol without IV contrast.  RADIATION DOSE REDUCTION: This exam was performed according to the departmental dose-optimization program which includes automated exposure control, adjustment of the mA and/or kV according to patient size and/or use of iterative reconstruction technique.  COMPARISON:  CT abdomen pelvis dated 11/12/2019.  FINDINGS: Evaluation of this exam is limited in the absence of intravenous contrast.  Lower chest: The visualized lung bases are clear.  No intra-abdominal free air or free fluid.  Hepatobiliary: The liver is unremarkable. Cholecystectomy. There is dilatation of the common bile duct, likely post cholecystectomy. No retained calcified stone noted the central CBD.  Pancreas: Unremarkable. No pancreatic ductal dilatation or surrounding inflammatory changes.  Spleen: Normal in size without focal abnormality.  Adrenals/Urinary Tract: The adrenal glands unremarkable. There is a 1.5 cm stone in the proximal left ureter with mild left hydronephrosis. There is no hydronephrosis or nephrolithiasis on the right. Similar appearance of a 1.5 cm linear cortical calcification of the inferior right kidney. The right ureter and urinary bladder appear unremarkable.  Stomach/Bowel: Postsurgical changes of gastric bypass. There is no bowel obstruction or active inflammation. The appendix is normal.  Vascular/Lymphatic: The abdominal aorta and IVC are unremarkable. No portal venous gas. There is no adenopathy.  Reproductive: The uterus  is anteverted. Small focus of calcification may represent a calcified fibroid. No adnexal masses.  Other: None  Musculoskeletal: Osteopenia with degenerative changes of the spine. Old L2 compression fracture and vertebroplasty. No acute osseous pathology.  IMPRESSION: A 1.5 cm stone in the proximal left ureter with mild left hydronephrosis.   Electronically Signed By: Anner Crete M.D. On: 08/09/2022 23:51  CT stone protocol was personally reviewed today.  Agree with above radiologic interpretation, see the above in HPI.  Assessment & Plan:  1. Left ureteral stone Left proximal ureteral calculus without signs or symptoms of infection  We discussed various treatment options for urolithiasis including observation with or without medical expulsive therapy, shockwave lithotripsy (SWL), ureteroscopy and laser lithotripsy with stent placement, and percutaneous nephrolithotomy.   We discussed that management is based on stone size, location, density, patient co-morbidities, and patient preference.    Stones <10m in size have a >80% spontaneous passage rate. Data surrounding the use of tamsulosin for medical expulsive therapy is controversial, but meta analyses suggests it is most efficacious for distal stones between 5-159min size. Possible side effects include dizziness/lightheadedness, and retrograde ejaculation.   SWL has a lower stone free rate in a single procedure, but also a lower complication rate compared to ureteroscopy and avoids a stent and associated stent related symptoms. Possible complications include renal hematoma, steinstrasse, and need for additional treatment. We discussed the role of his increased skin to stone distance can lead to decreased efficacy with shockwave lithotripsy.   Ureteroscopy with laser lithotripsy and stent placement has a higher stone free rate than SWL in a single procedure, however increased complication rate including possible infection,  ureteral injury, bleeding, and stent related morbidity. Common stent related symptoms include dysuria, urgency/frequency, and flank pain.   After an extensive discussion of the risks and benefits of the above treatment options, the patient would like to proceed with ESWL.  She understands the risk of Steinstrasse and possible need for additional procedures given the relatively large size of the stone.  In the interim, warning symptoms reviewed and indications for more urgent/emergent evaluation discussed.  - CULTURE, URINE COMPREHENSIVE; Future  2. Drug-induced constipation We discussed reduction of narcotics as possible  Recommend using MiraLAX, milk of mag and progression to mag citrate if this not effective     AsHollice EspyMD  [place]SuBayfielduUpper [place] [phone]

## 2022-08-10 NOTE — Progress Notes (Signed)
08/11/2022 2:48 PM   Natasha Estrada 01-03-1971 [id number]  Referring provider: Wayland Denis, PA-C [address] [place],  [place] [address]  Chief Complaint  Patient presents with   Nephrolithiasis    HPI: 51 year old female with a personal history of nephrolithiasis who returns today to the office to discuss management for 1.3 left proximal UPJ stone.  She was seen in the emergency room yesterday with acute onset left flank pain.  She was found to have obstructing left UPJ stone measuring 1.5 cm which was previously nonobstructing within the kidney.  Her labs are fairly unremarkable.  Her urinalysis was fairly bland primarily with RBCs and a few WBCs.  Her creatinine was 1.25.  Her pain was able to be quickly controlled and she was scheduled for prompt outpatient follow-up with [name].  Stone to skin distance approximately 11 cm.  Hounsfield units approximately 750.  She does have a personal history of nephrolithiasis.  She has had shockwave x2 before as well as ureteroscopy.  She reports today that the shockwave was successful once but none other time.  She had a very hard time tolerating the stent on previous ureteroscopy.  She continues to have left flank pain.  She denies any fevers or chills.  She feels bloated and is constipated.   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 on daily antibix treatment    Surgical  History: Past Surgical History:  Procedure Laterality Date   BLADDER STONE REMOVAL  2018   CERVICAL CONE BIOPSY  2018   CHOLECYSTECTOMY     GASTRIC ROUX-EN-Y  2013   KYPHOPLASTY N/A 12/07/2020   Procedure: L2 Kyphoplasty;  Surgeon: Hessie Knows, MD;  Location: [facility] ORS;  Service: Orthopedics;  Laterality: N/A;   ROTATOR CUFF REPAIR Left 2010   SHOULDER ARTHROSCOPY WITH SUBACROMIAL DECOMPRESSION AND OPEN ROTATOR C Left 10/19/2020   Procedure: LEFT SHOULDER ARTHROSCOPY WITH DEBRIDEMENT DECOMPRESSION AND POSSIBLE BICEP TENODESIS;  Surgeon: Corky Mull, MD;  Location: [facility] ORS;  Service: Orthopedics;  Laterality: Left;   SHOULDER ARTHROSCOPY WITH SUBACROMIAL DECOMPRESSION AND OPEN ROTATOR C Left 12/08/2021   Procedure: Left shoulder arthroscopy with debridement, decompression, and rotator cuff repair;  Surgeon: Corky Mull, MD;  Location: [facility] OR[facility];  Service: Orthopedics;  Laterality: Left;   TONSILLECTOMY     as a child   transforaminal epidural steroid injection Left 10/01/2020   C6-7    Home Medications:  Allergies as of 08/11/2022       Reactions   Cefuroxime Shortness Of Breath   1.) Tolerated 1st generation cephalosporin (CEPHALEXIN) courses on 10/17/2021 and 11/11/2021 without documented ADRs. 2.) Tolerates Augmentin per husband Joe   Sulfa Antibiotics Rash        Medication List        Accurate as of  August 11, 2022  2:48 PM. If you have any questions, ask your nurse or doctor.          acetaminophen 325 MG tablet Commonly known as: TYLENOL Take 2 tablets (650 mg total) by mouth every 6 (six) hours as needed for mild pain (or Fever >/= 101).   alendronate 70 MG tablet Commonly known as: FOSAMAX Take 70 mg by mouth once a week.   atorvastatin 20 MG tablet Commonly known as: LIPITOR Take 20 mg by mouth daily.   buPROPion 150 MG 24 hr tablet Commonly known as: WELLBUTRIN XL Take 150 mg by mouth daily.   busPIRone 15 MG tablet Commonly known as:  BUSPAR Take 15 mg by mouth 2 (two) times daily.   cyclobenzaprine 10 MG tablet Commonly known as: FLEXERIL Take 10 mg by mouth 3 (three) times daily as needed for muscle spasms.   DULoxetine 60 MG capsule Commonly known as: CYMBALTA Take 60 mg by mouth daily.   EPINEPHrine 0.3 mg/0.3 mL Soaj injection Commonly known as: EPI-PEN Inject 0.3 mg into the muscle once as needed (if patient exhibits significant signs and symptoms of allergic reaction.).   fluticasone 50 MCG/ACT nasal spray Commonly known as: FLONASE Place 1 spray into both nostrils daily as needed for rhinitis or allergies.   gabapentin 600 MG tablet Commonly known as: NEURONTIN Take 600 mg by mouth daily.   hydrOXYzine 25 MG capsule Commonly known as: VISTARIL Take 25 mg by mouth 3 (three) times daily.   lidocaine 5 % Commonly known as: LIDODERM APPLY 1 TO 2 PATCHES TOPICALLY TO THE SKIN EVERY DAY. MAY WEAR UP TO 12 HOURS   losartan 50 MG tablet Commonly known as: COZAAR Take 50 mg by mouth daily.   omeprazole 20 MG capsule Commonly known as: PRILOSEC Take 20 mg by mouth daily.   ondansetron 4 MG disintegrating tablet Commonly known as: ZOFRAN-ODT Take 1 tablet (4 mg total) by mouth every 8 (eight) hours as needed.   oxyCODONE 5 MG immediate release tablet Commonly known as: Roxicodone Take 1 tablet (5 mg total) by mouth every 8 (eight) hours as needed.   propranolol 40 MG tablet Commonly known as: INDERAL Take 40 mg by mouth at bedtime.   tiZANidine 4 MG tablet Commonly known as: ZANAFLEX Take 4 mg by mouth 3 (three) times daily.   traMADol 50 MG tablet Commonly known as: ULTRAM Take 50 mg by mouth 2 (two) times daily as needed.   trimethoprim 100 MG tablet Commonly known as: TRIMPEX TAKE 1 TABLET(100 MG) BY MOUTH DAILY   zolpidem 10 MG tablet Commonly known as: AMBIEN Take 10 mg by mouth daily.        Allergies:  Allergies  Allergen Reactions   Cefuroxime Shortness Of Breath     1.) Tolerated 1st generation cephalosporin (CEPHALEXIN) courses on 10/17/2021 and 11/11/2021 without documented ADRs. 2.) Tolerates Augmentin per husband Joe   Sulfa Antibiotics Rash    Family History: Family History  Problem Relation Age of Onset   Hypertension Mother     Social History:  reports that she has never smoked. She has never used smokeless tobacco. She reports that she does not currently use alcohol. She reports that she does not use drugs.   Physical Exam: BP 118/80   Pulse 78   Ht '5\' 5"'$  (1.651 m)   Wt 156 lb (70.8 kg)   LMP  (LMP Unknown)   BMI 25.96 kg/m   Constitutional:  Alert and oriented, No  acute distress. HEENT: [place] AT, moist mucus membranes.  Trachea midline, no masses. Cardiovascular: No clubbing, cyanosis, or edema. Respiratory: Normal respiratory effort, no increased work of breathing. GI: Abdomen is soft, nontender, nondistended, no abdominal masses GU: No CVA tenderness Skin: No rashes, bruises or suspicious lesions. Neurologic: Grossly intact, no focal deficits, moving all 4 extremities. Psychiatric: Normal mood and affect.  Laboratory Data: Lab Results  Component Value Date   WBC 5.5 08/09/2022   HGB 10.9 (L) 08/09/2022   HCT 33.4 (L) 08/09/2022   MCV 87.9 08/09/2022   PLT 274 08/09/2022    Lab Results  Component Value Date   CREATININE 1.25 (H) 08/09/2022     Urinalysis    Component Value Date/Time   COLORURINE YELLOW (A) 08/09/2022 2025   APPEARANCEUR HAZY (A) 08/09/2022 2025   APPEARANCEUR Clear 03/28/2022 1531   LABSPEC 1.020 08/09/2022 2025   PHURINE 5.0 08/09/2022 2025   GLUCOSEU NEGATIVE 08/09/2022 2025   HGBUR LARGE (A) 08/09/2022 2025   BILIRUBINUR NEGATIVE 08/09/2022 2025   BILIRUBINUR Negative 03/28/2022 1531   KETONESUR NEGATIVE 08/09/2022 2025   PROTEINUR 100 (A) 08/09/2022 2025   UROBILINOGEN 0.2 10/31/2019 1323   NITRITE NEGATIVE 08/09/2022 2025   LEUKOCYTESUR SMALL (A) 08/09/2022 2025    Lab Results   Component Value Date   LABMICR See below: 03/28/2022   WBCUA 0-5 03/28/2022   LABEPIT 0-10 03/28/2022   MUCUS Present (A) 01/26/2022   BACTERIA RARE (A) 08/09/2022    Pertinent Imaging: CT Renal Stone Study  Narrative CLINICAL DATA:  Flank pain.  Concern for kidney stone.  EXAM: CT ABDOMEN AND PELVIS WITHOUT CONTRAST  TECHNIQUE: Multidetector CT imaging of the abdomen and pelvis was performed following the standard protocol without IV contrast.  RADIATION DOSE REDUCTION: This exam was performed according to the departmental dose-optimization program which includes automated exposure control, adjustment of the mA and/or kV according to patient size and/or use of iterative reconstruction technique.  COMPARISON:  CT abdomen pelvis dated 11/12/2019.  FINDINGS: Evaluation of this exam is limited in the absence of intravenous contrast.  Lower chest: The visualized lung bases are clear.  No intra-abdominal free air or free fluid.  Hepatobiliary: The liver is unremarkable. Cholecystectomy. There is dilatation of the common bile duct, likely post cholecystectomy. No retained calcified stone noted the central CBD.  Pancreas: Unremarkable. No pancreatic ductal dilatation or surrounding inflammatory changes.  Spleen: Normal in size without focal abnormality.  Adrenals/Urinary Tract: The adrenal glands unremarkable. There is a 1.5 cm stone in the proximal left ureter with mild left hydronephrosis. There is no hydronephrosis or nephrolithiasis on the right. Similar appearance of a 1.5 cm linear cortical calcification of the inferior right kidney. The right ureter and urinary bladder appear unremarkable.  Stomach/Bowel: Postsurgical changes of gastric bypass. There is no bowel obstruction or active inflammation. The appendix is normal.  Vascular/Lymphatic: The abdominal aorta and IVC are unremarkable. No portal venous gas. There is no adenopathy.  Reproductive: The uterus  is anteverted. Small focus of calcification may represent a calcified fibroid. No adnexal masses.  Other: None  Musculoskeletal: Osteopenia with degenerative changes of the spine. Old L2 compression fracture and vertebroplasty. No acute osseous pathology.  IMPRESSION: A 1.5 cm stone in the proximal left ureter with mild left hydronephrosis.   Electronically Signed By: Anner Crete M.D. On: 08/09/2022 23:51  CT stone protocol was personally reviewed today.  Agree with above radiologic interpretation, see the above in HPI.  Assessment & Plan:  1. Left ureteral stone Left proximal ureteral calculus without signs or symptoms of infection  We discussed various treatment options for urolithiasis including observation with or without medical expulsive therapy, shockwave lithotripsy (SWL), ureteroscopy and laser lithotripsy with stent placement, and percutaneous nephrolithotomy.   We discussed that management is based on stone size, location, density, patient co-morbidities, and patient preference.    Stones <29m in size have a >80% spontaneous passage rate. Data surrounding the use of tamsulosin for medical expulsive therapy is controversial, but meta analyses suggests it is most efficacious for distal stones between 5-160min size. Possible side effects include dizziness/lightheadedness, and retrograde ejaculation.   SWL has a lower stone free rate in a single procedure, but also a lower complication rate compared to ureteroscopy and avoids a stent and associated stent related symptoms. Possible complications include renal hematoma, steinstrasse, and need for additional treatment. We discussed the role of his increased skin to stone distance can lead to decreased efficacy with shockwave lithotripsy.   Ureteroscopy with laser lithotripsy and stent placement has a higher stone free rate than SWL in a single procedure, however increased complication rate including possible infection,  ureteral injury, bleeding, and stent related morbidity. Common stent related symptoms include dysuria, urgency/frequency, and flank pain.   After an extensive discussion of the risks and benefits of the above treatment options, the patient would like to proceed with ESWL.  She understands the risk of Steinstrasse and possible need for additional procedures given the relatively large size of the stone.  In the interim, warning symptoms reviewed and indications for more urgent/emergent evaluation discussed.  - CULTURE, URINE COMPREHENSIVE; Future  2. Drug-induced constipation We discussed reduction of narcotics as possible  Recommend using MiraLAX, milk of mag and progression to mag citrate if this not effective     AsHollice EspyMD  [facility] Center[address]SuOak ParkuLe Center[place] [id number]

## 2022-08-10 NOTE — Discharge Instructions (Signed)
Use Tylenol for pain and fevers.  Up to 1000 mg per dose, up to 4 times per day.  Do not take more than 4000 mg of Tylenol/acetaminophen within 24 hours..  Use oxycodone as needed for more severe/breakthrough pain.  Use Zofran as needed for nausea and vomiting.  Avoid NSAIDs such as ibuprofen, Aleve, aspirin, BC/Goody's.  Asked Dr. Erlene Quan about these medications, because that may prevent certain procedures that she can do if you are taking this medicine.  The clinic of Dr. Erlene Quan should reach out to later today (Thursday).  If you do not hear from them, please call her clinic

## 2022-08-10 NOTE — ED Provider Notes (Signed)
[facility] Provider Note    Event Date/Time   First MD Initiated Contact with Patient 08/10/22 0003     (approximate)   History   Abdominal Pain   HPI  Natasha Estrada is a 51 y.o. female who presents to the ED for evaluation of Abdominal Pain   Patient presents to the ED with her mother for evaluation of left-sided flank pain with nausea and emesis that started tonight in the past few hours.  She reports it feels like a terrible kidney stone.  Reports having stones in the past.  Reports needing ureteral stents in the past.  Reports is the worst pain she is ever felt.  No fevers, dysuria.   Physical Exam   Triage Vital Signs: ED Triage Vitals [08/09/22 2014]  Enc Vitals Group     BP 124/82     Pulse Rate 72     Resp 20     Temp 97.6 F (36.4 C)     Temp Source Oral     SpO2 94 %     Weight      Height      Head Circumference      Peak Flow      Pain Score      Pain Loc      Pain Edu?      Excl. in [place]?     Most recent vital signs: Vitals:   08/09/22 2014  BP: 124/82  Pulse: 72  Resp: 20  Temp: 97.6 F (36.4 C)  SpO2: 94%    General: Awake, no distress.  Seems uncomfortable. CV:  Good peripheral perfusion.  Resp:  Normal effort.  Abd:  No distention.  Poorly localizing left-sided abdominal and flank discomfort. MSK:  No deformity noted.  Neuro:  No focal deficits appreciated. Other:     ED Results / Procedures / Treatments   Labs (all labs ordered are listed, but only abnormal results are displayed) Labs Reviewed  COMPREHENSIVE METABOLIC PANEL - Abnormal; Notable for the following components:      Result Value   Glucose, Bld 129 (*)    BUN 26 (*)    Creatinine, Ser 1.25 (*)    GFR, Estimated 52 (*)    All other components within normal limits  CBC - Abnormal; Notable for the following components:   RBC 3.80 (*)    Hemoglobin 10.9 (*)    HCT 33.4 (*)    All other components within normal limits  URINALYSIS, ROUTINE  W REFLEX MICROSCOPIC - Abnormal; Notable for the following components:   Color, Urine YELLOW (*)    APPearance HAZY (*)    Hgb urine dipstick LARGE (*)    Protein, ur 100 (*)    Leukocytes,Ua SMALL (*)    RBC / HPF >50 (*)    Bacteria, UA RARE (*)    All other components within normal limits  LIPASE, BLOOD  ETHANOL  POC URINE PREG, ED  TROPONIN I (HIGH SENSITIVITY)    EKG Sinus rhythm with a rate of 74 bpm.  Normal axis and intervals.  No clear signs of acute ischemia.  RADIOLOGY CT renal study interpreted by me with large proximal obstructing ureteral stone on the left.  Official radiology report(s): CT Renal Stone Study  Result Date: 08/09/2022 CLINICAL DATA:  Flank pain.  Concern for kidney stone. EXAM: CT ABDOMEN AND PELVIS WITHOUT CONTRAST TECHNIQUE: Multidetector CT imaging of the abdomen and pelvis was performed following the standard protocol without IV  contrast. RADIATION DOSE REDUCTION: This exam was performed according to the departmental dose-optimization program which includes automated exposure control, adjustment of the mA and/or kV according to patient size and/or use of iterative reconstruction technique. COMPARISON:  CT abdomen pelvis dated 11/12/2019. FINDINGS: Evaluation of this exam is limited in the absence of intravenous contrast. Lower chest: The visualized lung bases are clear. No intra-abdominal free air or free fluid. Hepatobiliary: The liver is unremarkable. Cholecystectomy. There is dilatation of the common bile duct, likely post cholecystectomy. No retained calcified stone noted the central CBD. Pancreas: Unremarkable. No pancreatic ductal dilatation or surrounding inflammatory changes. Spleen: Normal in size without focal abnormality. Adrenals/Urinary Tract: The adrenal glands unremarkable. There is a 1.5 cm stone in the proximal left ureter with mild left hydronephrosis. There is no hydronephrosis or nephrolithiasis on the right. Similar appearance of a 1.5 cm  linear cortical calcification of the inferior right kidney. The right ureter and urinary bladder appear unremarkable. Stomach/Bowel: Postsurgical changes of gastric bypass. There is no bowel obstruction or active inflammation. The appendix is normal. Vascular/Lymphatic: The abdominal aorta and IVC are unremarkable. No portal venous gas. There is no adenopathy. Reproductive: The uterus is anteverted. Small focus of calcification may represent a calcified fibroid. No adnexal masses. Other: None Musculoskeletal: Osteopenia with degenerative changes of the spine. Old L2 compression fracture and vertebroplasty. No acute osseous pathology. IMPRESSION: A 1.5 cm stone in the proximal left ureter with mild left hydronephrosis. Electronically Signed   By: Anner Crete M.D.   On: 08/09/2022 23:51    PROCEDURES and INTERVENTIONS:  Procedures  Medications - No data to display   IMPRESSION / MDM / [place] / ED COURSE  I reviewed the triage vital signs and the nursing notes.  Differential diagnosis includes, but is not limited to, ureteral stone, nephritis, UTI, ACS  {Patient presents with symptoms of an acute illness or injury that is potentially life-threatening.  51 year old woman presents to the ED with ureteral stone suitable for outpatient management and urology follow-up.  Seems uncomfortable but does not have a peritoneal abdomen.  Urine with blood without infectious features.  No leukocytosis or signs of sepsis, seem to have renal dysfunction around baseline with a creatinine of 1.25.  Nonischemic EKG negative troponins.  CT confirms a large proximal ureteral stone.  I consult with urology who can see the patient quickly as an outpatient.  She is transition to oral medications with continued controlled pain.  Provided prescriptions for the same at home, discussed return precautions and following up with urology.  Clinical Course as of 08/10/22 0147  Thu Aug 10, 2022  0012 Dr. Erlene Quan,  no need for intervention right now. If we can control her pain, then they can get her into clinic in the next day or two, then possibly intervene as an outpatient early next week [DS]  0127 Reassessed.  Pain is currently controlled.  We discussed transitioning to p.o. medications and p.o. challenge.  Answered questions [DS]    Clinical Course User Index [DS] Vladimir Crofts, MD     FINAL CLINICAL IMPRESSION(S) / ED DIAGNOSES   Final diagnoses:  None     Rx / DC Orders   ED Discharge Orders     None        Note:  This document was prepared using Dragon voice recognition software and may include unintentional dictation errors.   Vladimir Crofts, MD 08/10/22 [phone]

## 2022-08-11 ENCOUNTER — Ambulatory Visit: Payer: Federal, State, Local not specified - PPO | Admitting: Physician Assistant

## 2022-08-11 ENCOUNTER — Ambulatory Visit: Payer: Federal, State, Local not specified - PPO | Admitting: Urology

## 2022-08-11 ENCOUNTER — Encounter: Payer: Self-pay | Admitting: Urology

## 2022-08-11 VITALS — BP 118/80 | HR 78 | Ht 65.0 in | Wt 156.0 lb

## 2022-08-11 DIAGNOSIS — K5903 Drug induced constipation: Secondary | ICD-10-CM | POA: Diagnosis not present

## 2022-08-11 DIAGNOSIS — N201 Calculus of ureter: Secondary | ICD-10-CM

## 2022-08-11 MED ORDER — OXYCODONE HCL 5 MG PO TABS: 5.0000 mg | ORAL_TABLET | Freq: Three times a day (TID) | ORAL | 0 refills | Status: DC | PRN

## 2022-08-14 ENCOUNTER — Other Ambulatory Visit: Payer: Self-pay

## 2022-08-14 ENCOUNTER — Other Ambulatory Visit
Admission: RE | Admit: 2022-08-14 | Discharge: 2022-08-14 | Disposition: A | Discharge status: Home / Self Care | Payer: Federal, State, Local not specified - PPO | Source: Home / Self Care | Attending: Urology | Admitting: Urology

## 2022-08-14 DIAGNOSIS — Z87442 Personal history of urinary calculi: Secondary | ICD-10-CM | POA: Diagnosis not present

## 2022-08-14 DIAGNOSIS — N201 Calculus of ureter: Secondary | ICD-10-CM | POA: Insufficient documentation

## 2022-08-14 LAB — URINALYSIS, COMPLETE (UACMP) WITH MICROSCOPIC
Bilirubin Urine: NEGATIVE
Glucose, UA: NEGATIVE mg/dL
Ketones, ur: NEGATIVE mg/dL
Nitrite: NEGATIVE
Protein, ur: 30 mg/dL — AB
RBC / HPF: >50 RBC/hpf — ABNORMAL HIGH (ref 0–5)
Specific Gravity, Urine: 1.016 (ref 1.005–1.030)
WBC, UA: >50 WBC/hpf — ABNORMAL HIGH (ref 0–5)
pH: 5 (ref 5.0–8.0)

## 2022-08-14 NOTE — Progress Notes (Signed)
Patient will need to have urinalysis and culture done today at [facility] lab. Orders Placed and Patient notified.

## 2022-08-14 NOTE — Progress Notes (Signed)
ESWL ORDER FORM  Expected date of procedure: 08/17/2022  Surgeon: John Giovanni, MD  Post op standing: 2-4wk follow up w/KUB prior  Anticoagulation/Aspirin/NSAID standing order: Hold all 72 hours prior  Anesthesia standing order: MAC  VTE standing: SCD's  Dx: Left Ureteral Stone  Procedure: left Extracorporeal shock wave lithotripsy  CPT : 41660  Standing Order Set:   *NPO after mn, KUB  *NS 119m/hr, Keflex 5066mPO, Benadryl 2551mO, Valium 72m12m, Zofran 4mg 53m   Medications if other than standing orders:   NONE

## 2022-08-15 ENCOUNTER — Encounter: Payer: Self-pay | Admitting: Urology

## 2022-08-15 LAB — URINE CULTURE: Culture: NO GROWTH

## 2022-08-16 MED ORDER — CIPROFLOXACIN HCL 500 MG PO TABS: 500.0000 mg | ORAL_TABLET | Freq: Once | ORAL | Status: DC

## 2022-08-16 MED ORDER — SODIUM CHLORIDE 0.9 % IV SOLN: INTRAVENOUS | Status: DC

## 2022-08-16 MED ORDER — ONDANSETRON HCL 4 MG/2ML IJ SOLN: 4.0000 mg | Freq: Once | INTRAMUSCULAR | Status: AC

## 2022-08-16 MED ORDER — DIPHENHYDRAMINE HCL 25 MG PO CAPS: 25.0000 mg | ORAL_CAPSULE | ORAL | Status: AC

## 2022-08-16 MED ORDER — DIAZEPAM 5 MG PO TABS: 10.0000 mg | ORAL_TABLET | ORAL | Status: AC

## 2022-08-17 ENCOUNTER — Other Ambulatory Visit: Payer: Self-pay

## 2022-08-17 ENCOUNTER — Ambulatory Visit
Admission: RE | Admit: 2022-08-17 | Discharge: 2022-08-17 | Disposition: A | Discharge status: Home / Self Care | Payer: Federal, State, Local not specified - PPO | Source: Ambulatory Visit | Attending: Urology | Admitting: Urology

## 2022-08-17 ENCOUNTER — Encounter
Admission: RE | Disposition: A | Discharge status: Home / Self Care | Payer: Self-pay | Source: Ambulatory Visit | Attending: Urology

## 2022-08-17 ENCOUNTER — Ambulatory Visit: Payer: Federal, State, Local not specified - PPO

## 2022-08-17 ENCOUNTER — Encounter: Payer: Self-pay | Admitting: Urology

## 2022-08-17 DIAGNOSIS — N201 Calculus of ureter: Secondary | ICD-10-CM | POA: Insufficient documentation

## 2022-08-17 DIAGNOSIS — Z87442 Personal history of urinary calculi: Secondary | ICD-10-CM | POA: Insufficient documentation

## 2022-08-17 HISTORY — PX: EXTRACORPOREAL SHOCK WAVE LITHOTRIPSY: SHX1557

## 2022-08-17 SURGERY — LITHOTRIPSY, ESWL
Anesthesia: Moderate Sedation | Laterality: Left

## 2022-08-17 MED ORDER — TAMSULOSIN HCL 0.4 MG PO CAPS: 0.4000 mg | ORAL_CAPSULE | Freq: Every day | ORAL | 0 refills | Status: DC

## 2022-08-17 MED ORDER — DIPHENHYDRAMINE HCL 25 MG PO CAPS
ORAL_CAPSULE | ORAL | Status: AC
  Administered 2022-08-17: 25 mg via ORAL
  Filled 2022-08-17: qty 1

## 2022-08-17 MED ORDER — DIAZEPAM 5 MG PO TABS
ORAL_TABLET | ORAL | Status: AC
  Administered 2022-08-17: 10 mg via ORAL
  Filled 2022-08-17: qty 1

## 2022-08-17 MED ORDER — CIPROFLOXACIN HCL 500 MG PO TABS: 500.0000 mg | ORAL_TABLET | Freq: Once | ORAL | Status: AC

## 2022-08-17 MED ORDER — ONDANSETRON HCL 4 MG/2ML IJ SOLN
INTRAMUSCULAR | Status: AC
  Administered 2022-08-17: 4 mg via INTRAVENOUS
  Filled 2022-08-17: qty 2

## 2022-08-17 MED ORDER — CIPROFLOXACIN HCL 500 MG PO TABS
ORAL_TABLET | ORAL | Status: AC
  Administered 2022-08-17: 500 mg via ORAL
  Filled 2022-08-17: qty 1

## 2022-08-17 NOTE — Discharge Instructions (Addendum)
As per the [facility] discharge instructions continue pain and medication as needed A prescription for tamsulosin which will help you pass stone fragments was also sent to your pharmacy Call [facility] at [phone] for pain not controlled with oral medications or fever greater than 101 degrees You will be contacted for a follow-up appointment    AMBULATORY SURGERY  DISCHARGE INSTRUCTIONS   The drugs that you were given will stay in your system until tomorrow so for the next 24 hours you should not:  Drive an automobile Make any legal decisions Drink any alcoholic beverage   You may resume regular meals tomorrow.  Today it is better to start with liquids and gradually work up to solid foods.  You may eat anything you prefer, but it is better to start with liquids, then soup and crackers, and gradually work up to solid foods.   Please notify your doctor immediately if you have any unusual bleeding, trouble breathing, redness and pain at the surgery site, drainage, fever, or pain not relieved by medication.    Additional Instructions:    Please contact your physician with any problems or Same Day Surgery at [phone], Monday through Friday 6 am to 4 pm, or [place] at [facility] number at [phone].

## 2022-08-17 NOTE — Interval H&P Note (Signed)
History and Physical Interval Note:  CV:RRR Lungs:clear  08/17/2022 4:02 PM  Natasha Estrada  has presented today for surgery, with the diagnosis of Left Ureteral Stone.  The various methods of treatment have been discussed with the patient and family. After consideration of risks, benefits and other options for treatment, the patient has consented to  Procedure(s): EXTRACORPOREAL SHOCK WAVE LITHOTRIPSY (ESWL) (Left) as a surgical intervention.  The patient's history has been reviewed, patient examined, no change in status, stable for surgery.  I have reviewed the patient's chart and labs.  Questions were answered to the patient's satisfaction.     [place]

## 2022-08-18 ENCOUNTER — Encounter: Payer: Self-pay | Admitting: Urology

## 2022-08-21 ENCOUNTER — Telehealth: Payer: Self-pay | Admitting: Urology

## 2022-08-21 NOTE — Telephone Encounter (Signed)
Spoke to patient and informed her as the fragments begin to pass there will be some pain. I informed her that if she is unable to urinate or has a fever greater than 101 to call the office. Patient voiced understanding.

## 2022-08-21 NOTE — Telephone Encounter (Signed)
Pt calling having pain left kidney where she had to procedure.  When pt takes Tylenol it gets better.  How long will the pain last? Please call [phone].  Thank you.

## 2022-08-28 ENCOUNTER — Other Ambulatory Visit: Payer: Self-pay | Admitting: Urology

## 2022-08-30 ENCOUNTER — Other Ambulatory Visit: Payer: Self-pay | Admitting: Internal Medicine

## 2022-08-30 DIAGNOSIS — R0602 Shortness of breath: Secondary | ICD-10-CM

## 2022-09-01 ENCOUNTER — Other Ambulatory Visit: Payer: Self-pay

## 2022-09-01 DIAGNOSIS — N201 Calculus of ureter: Secondary | ICD-10-CM

## 2022-09-04 NOTE — Progress Notes (Signed)
09/05/2022 12:04 PM   Natasha Estrada 05/13/1971 [id number]  Referring provider: Wayland Denis, PA-C [address] [place],   [address]  Urological history: 1. Complex cyst -MRI (03/2022) -  9 mm mildly complex cyst in the mid right kidney, Bosniak 2.  2.  Nephrolithiasis -CT renal stone study (08/2022) -  a 1.5 cm stone in the proximal left ureter with mild left hydronephrosis. There is no hydronephrosis or nephrolithiasis on the right. Similar appearance of a 1.5 cm linear cortical calcification of the inferior right kidney -s/p ESWL (08/17/2022)   3. rUTI's - risk factors of age, constipation and incontinence - cysto 05/2020 was NED  - CT renal stone study (08/2022) - nephrolithiasis -Documented urine cultures over the last year  08/14/2022 no growth  03/28/2022 MUF  11/11/2021 E.coli  10/15/2021 E.coli  Chief Complaint  Patient presents with   Nephrolithiasis    HPI: Natasha Estrada is a 51 y.o. female who is status post ESWL who presents today for follow up.  Underwent ESWL on 08/17/2022 for 1.5 cm left proximal stone with Dr. Bernardo Heater.  Their postprocedural course was as expected and uneventful.   They have not passed fragments.     KUB (09/05/2022) 1.5 cm collection of fragments in the left UVJ  UA >30 WBC's, 3-10 RBC's and many bacteria   She is having urinary urgency, frequency and left flank pain.  Patient denies any modifying or aggravating factors.  Patient denies any gross hematuria, dysuria or suprapubic.  Patient denies any fevers, chills, nausea or vomiting.    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 on daily antibix treatment    Surgical History: Past Surgical History:  Procedure Laterality Date   BLADDER STONE REMOVAL  2018   CERVICAL CONE BIOPSY  2018   CHOLECYSTECTOMY     EXTRACORPOREAL SHOCK WAVE LITHOTRIPSY Left 08/17/2022   Procedure: EXTRACORPOREAL SHOCK WAVE LITHOTRIPSY (ESWL);  Surgeon: Abbie Sons, MD;  Location: [facility];  Service: Urology;  Laterality: Left;   GASTRIC ROUX-EN-Y  2013   KYPHOPLASTY N/A 12/07/2020   Procedure: L2 Kyphoplasty;  Surgeon: Hessie Knows, MD;  Location: [facility];  Service: Orthopedics;  Laterality: N/A;   ROTATOR CUFF REPAIR Left 2010   SHOULDER ARTHROSCOPY WITH SUBACROMIAL DECOMPRESSION AND OPEN ROTATOR C Left 10/19/2020   Procedure: LEFT SHOULDER ARTHROSCOPY WITH DEBRIDEMENT DECOMPRESSION AND POSSIBLE BICEP TENODESIS;  Surgeon: Corky Mull, MD;  Location: [facility];  Service: Orthopedics;  Laterality: Left;   SHOULDER ARTHROSCOPY WITH SUBACROMIAL DECOMPRESSION AND OPEN ROTATOR C Left 12/08/2021   Procedure: Left shoulder arthroscopy with debridement, decompression, and rotator cuff repair;  Surgeon: Corky Mull, MD;  Location: [facility];  Service: Orthopedics;  Laterality: Left;   TONSILLECTOMY     as a child   transforaminal  epidural steroid injection Left 10/01/2020   C6-7    Home Medications:  Current Outpatient Medications on File Prior to Visit  Medication Sig Dispense Refill   acetaminophen (TYLENOL) 325 MG tablet Take 2 tablets (650 mg total) by mouth every 6 (six) hours as needed for mild pain (or Fever >/= 101).     alendronate (FOSAMAX) 70 MG tablet Take 70 mg by mouth once a week.     atorvastatin (LIPITOR) 20 MG tablet Take 20 mg by mouth daily.     buPROPion (WELLBUTRIN XL) 150 MG 24 hr tablet Take 150 mg by  mouth daily.      busPIRone (BUSPAR) 15 MG tablet Take 15 mg by mouth 2 (two) times daily.     cyclobenzaprine (FLEXERIL) 10 MG tablet Take 10 mg by mouth 3 (three) times daily as needed for muscle spasms.     DULoxetine (CYMBALTA) 60 MG capsule Take 60 mg by mouth daily.     EPINEPHrine 0.3 mg/0.3 mL IJ SOAJ injection Inject 0.3 mg into the muscle once as needed (if patient exhibits significant signs and symptoms of allergic reaction.). 1 each 0   fluticasone (FLONASE) 50 MCG/ACT nasal spray Place 1 spray into both nostrils daily as needed for rhinitis or allergies.     gabapentin (NEURONTIN) 600 MG tablet Take 600 mg by mouth daily.     hydrOXYzine (VISTARIL) 25 MG capsule Take 25 mg by mouth 3 (three) times daily.     lidocaine (LIDODERM) 5 % APPLY 1 TO 2 PATCHES TOPICALLY TO THE SKIN EVERY DAY. MAY WEAR UP TO 12 HOURS     losartan (COZAAR) 50 MG tablet Take 50 mg by mouth daily.     omeprazole (PRILOSEC) 20 MG capsule Take 20 mg by mouth daily.     ondansetron (ZOFRAN-ODT) 4 MG disintegrating tablet Take 1 tablet (4 mg total) by mouth every 8 (eight) hours as needed. 20 tablet 0   oxyCODONE (ROXICODONE) 5 MG immediate release tablet Take 1 tablet (5 mg total) by mouth every 8 (eight) hours as needed. 20 tablet 0   propranolol (INDERAL) 40 MG tablet Take 40 mg by mouth at bedtime.     tamsulosin (FLOMAX) 0.4 MG CAPS capsule Take 1 capsule (0.4 mg total) by mouth daily after breakfast. 14 capsule 0   tiZANidine (ZANAFLEX) 4 MG tablet Take 4 mg by mouth 3 (three) times daily.     traMADol (ULTRAM) 50 MG tablet Take 50 mg by mouth 2 (two) times daily as needed.     trimethoprim (TRIMPEX) 100 MG tablet TAKE 1 TABLET(100 MG) BY MOUTH DAILY 30 tablet 11   zolpidem (AMBIEN) 10 MG tablet Take 10 mg by mouth daily.     Current Facility-Administered Medications on File Prior to Visit  Medication Dose Route Frequency Provider Last Rate Last Admin   ciprofloxacin (CIPRO) tablet 500 mg  500 mg Oral Once  Hollice Espy, MD        Allergies:  Allergies  Allergen Reactions   Cefuroxime Shortness Of Breath    1.) Tolerated 1st generation cephalosporin (CEPHALEXIN) courses on 10/17/2021 and 11/11/2021 without documented ADRs. 2.) Tolerates Augmentin per husband Joe   Sulfa Antibiotics Rash    Family History: Family History  Problem Relation Age of Onset   Hypertension Mother     Social History:  reports that she has never smoked. She has never used smokeless tobacco. She reports that she does not currently use alcohol. She reports that she does not  use drugs.  ROS: Pertinent ROS in HPI  Physical Exam: BP 122/84   Pulse 76   Ht '5\' 4"'$  (1.626 m)   Wt 152 lb (68.9 kg)   LMP  (LMP Unknown)   BMI 26.09 kg/m   Constitutional:  Well nourished. Alert and oriented, No acute distress. HEENT: [place] AT, mask in place.   Trachea midline. Cardiovascular: No clubbing, cyanosis, or edema. Respiratory: Normal respiratory effort, no increased work of breathing. Neurologic: Grossly intact, no focal deficits, moving all 4 extremities. Psychiatric: Normal mood and affect.  Laboratory Data: Lab Results  Component Value Date   WBC 5.5 08/09/2022   HGB 10.9 (L) 08/09/2022   HCT 33.4 (L) 08/09/2022   MCV 87.9 08/09/2022   PLT 274 08/09/2022    Lab Results  Component Value Date   CREATININE 1.25 (H) 08/09/2022    Urinalysis See Epic and HPI I have reviewed the labs.   Pertinent Imaging: KUB shows stone fragments have migrated into the left UVJ I have independently reviewed the films.  Radiologist interpretation pending  Assessment & Plan:    1. Left proximal stone  -left UVJ stone -will schedule retreat left ESWL -Explained to the patient that with ESWL, shock waves are focused on the stone using X-rays to pinpoint the stone.  The shock waves are fired repeatedly which usually causes the stone to break into small pieces which pass out in the urine over the next few weeks.  They will  go home that day and may be able to resume normal activities in three days.  They should be given a strainer and they need to collect any fragments/sediments that they pass for analysis.   Risks involved with the procedure consist of bruising of the skin and kidney, possible long-term kidney damage, development of HTN, damage to the bowel, spleen, liver, pancreas, female organs or lungs, hematuria, hematuria serious enough to require transfusion or surgical repair, formation of a hematoma, infection or sepsis.  If the stone is too large or dense, it may not break apart or break apart in large pieces and cause "Steinstrasse."  If this happens, it would result in the need for another procedure (likely URS/LL/stent placement).  IV sedation is typically used, but in rare instances general anesthesia is used with the risk of irregular heart beat, irregular BP, stroke, MI, CVA, paralysis, coma and/or death.   2. Suspected UTI -UA -Urine culture -Keflex 500 mg twice daily for seven days  -start today   Return for left ESWL .  These notes generated with voice recognition software. I apologize for typographical errors.  [place], [place] [address]  [place], [place] [address] [phone]

## 2022-09-04 NOTE — H&P (View-Only) (Signed)
09/05/2022 12:04 PM   [name] 08/23/1971 [id number]  Referring provider: Wayland Denis, PA-C [address] [place],  [place] [address]  Urological history: 1. Complex cyst -MRI (03/2022) -  9 mm mildly complex cyst in the mid right kidney, Bosniak 2.  2.  Nephrolithiasis -CT renal stone study (08/2022) -  a 1.5 cm stone in the proximal left ureter with mild left hydronephrosis. There is no hydronephrosis or nephrolithiasis on the right. Similar appearance of a 1.5 cm linear cortical calcification of the inferior right kidney -s/p ESWL (08/17/2022)   3. rUTI's - risk factors of age, constipation and incontinence - cysto 05/2020 was NED  - CT renal stone study (08/2022) - nephrolithiasis -Documented urine cultures over the last year  08/14/2022 no growth  03/28/2022 MUF  11/11/2021 E.coli  10/15/2021 E.coli  Chief Complaint  Patient presents with   Nephrolithiasis    HPI: Eveline Sauve is a 51 y.o. female who is status post ESWL who presents today for follow up.  Underwent ESWL on 08/17/2022 for 1.5 cm left proximal stone with Dr. Bernardo Heater.  Their postprocedural course was as expected and uneventful.   They have not passed fragments.     KUB (09/05/2022) 1.5 cm collection of fragments in the left UVJ  UA >30 WBC's, 3-10 RBC's and many bacteria   She is having urinary urgency, frequency and left flank pain.  Patient denies any modifying or aggravating factors.  Patient denies any gross hematuria, dysuria or suprapubic.  Patient denies any fevers, chills, nausea or vomiting.    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 on daily antibix treatment    Surgical History: Past Surgical History:  Procedure Laterality Date   BLADDER STONE REMOVAL  2018   CERVICAL CONE BIOPSY  2018   CHOLECYSTECTOMY     EXTRACORPOREAL SHOCK WAVE LITHOTRIPSY Left 08/17/2022   Procedure: EXTRACORPOREAL SHOCK WAVE LITHOTRIPSY (ESWL);  Surgeon: Abbie Sons, MD;  Location: [facility];  Service: Urology;  Laterality: Left;   GASTRIC ROUX-EN-Y  2013   KYPHOPLASTY N/A 12/07/2020   Procedure: L2 Kyphoplasty;  Surgeon: Hessie Knows, MD;  Location: [facility];  Service: Orthopedics;  Laterality: N/A;   ROTATOR CUFF REPAIR Left 2010   SHOULDER ARTHROSCOPY WITH SUBACROMIAL DECOMPRESSION AND OPEN ROTATOR C Left 10/19/2020   Procedure: LEFT SHOULDER ARTHROSCOPY WITH DEBRIDEMENT DECOMPRESSION AND POSSIBLE BICEP TENODESIS;  Surgeon: Corky Mull, MD;  Location: [facility];  Service: Orthopedics;  Laterality: Left;   SHOULDER ARTHROSCOPY WITH SUBACROMIAL DECOMPRESSION AND OPEN ROTATOR C Left 12/08/2021   Procedure: Left shoulder arthroscopy with debridement, decompression, and rotator cuff repair;  Surgeon: Corky Mull, MD;  Location: [facility];  Service: Orthopedics;  Laterality: Left;   TONSILLECTOMY     as a child   transforaminal  epidural steroid injection Left 10/01/2020   C6-7    Home Medications:  Current Outpatient Medications on File Prior to Visit  Medication Sig Dispense Refill   acetaminophen (TYLENOL) 325 MG tablet Take 2 tablets (650 mg total) by mouth every 6 (six) hours as needed for mild pain (or Fever >/= 101).     alendronate (FOSAMAX) 70 MG tablet Take 70 mg by mouth once a week.     atorvastatin (LIPITOR) 20 MG tablet Take 20 mg by mouth daily.     buPROPion (WELLBUTRIN XL) 150 MG 24 hr tablet Take 150 mg by  mouth daily.      busPIRone (BUSPAR) 15 MG tablet Take 15 mg by mouth 2 (two) times daily.     cyclobenzaprine (FLEXERIL) 10 MG tablet Take 10 mg by mouth 3 (three) times daily as needed for muscle spasms.     DULoxetine (CYMBALTA) 60 MG capsule Take 60 mg by mouth daily.     EPINEPHrine 0.3 mg/0.3 mL IJ SOAJ injection Inject 0.3 mg into the muscle once as needed (if patient exhibits significant signs and symptoms of allergic reaction.). 1 each 0   fluticasone (FLONASE) 50 MCG/ACT nasal spray Place 1 spray into both nostrils daily as needed for rhinitis or allergies.     gabapentin (NEURONTIN) 600 MG tablet Take 600 mg by mouth daily.     hydrOXYzine (VISTARIL) 25 MG capsule Take 25 mg by mouth 3 (three) times daily.     lidocaine (LIDODERM) 5 % APPLY 1 TO 2 PATCHES TOPICALLY TO THE SKIN EVERY DAY. MAY WEAR UP TO 12 HOURS     losartan (COZAAR) 50 MG tablet Take 50 mg by mouth daily.     omeprazole (PRILOSEC) 20 MG capsule Take 20 mg by mouth daily.     ondansetron (ZOFRAN-ODT) 4 MG disintegrating tablet Take 1 tablet (4 mg total) by mouth every 8 (eight) hours as needed. 20 tablet 0   oxyCODONE (ROXICODONE) 5 MG immediate release tablet Take 1 tablet (5 mg total) by mouth every 8 (eight) hours as needed. 20 tablet 0   propranolol (INDERAL) 40 MG tablet Take 40 mg by mouth at bedtime.     tamsulosin (FLOMAX) 0.4 MG CAPS capsule Take 1 capsule (0.4 mg total) by mouth daily after breakfast. 14 capsule 0   tiZANidine (ZANAFLEX) 4 MG tablet Take 4 mg by mouth 3 (three) times daily.     traMADol (ULTRAM) 50 MG tablet Take 50 mg by mouth 2 (two) times daily as needed.     trimethoprim (TRIMPEX) 100 MG tablet TAKE 1 TABLET(100 MG) BY MOUTH DAILY 30 tablet 11   zolpidem (AMBIEN) 10 MG tablet Take 10 mg by mouth daily.     Current Facility-Administered Medications on File Prior to Visit  Medication Dose Route Frequency Provider Last Rate Last Admin   ciprofloxacin (CIPRO) tablet 500 mg  500 mg Oral Once  Hollice Espy, MD        Allergies:  Allergies  Allergen Reactions   Cefuroxime Shortness Of Breath    1.) Tolerated 1st generation cephalosporin (CEPHALEXIN) courses on 10/17/2021 and 11/11/2021 without documented ADRs. 2.) Tolerates Augmentin per husband Joe   Sulfa Antibiotics Rash    Family History: Family History  Problem Relation Age of Onset   Hypertension Mother     Social History:  reports that she has never smoked. She has never used smokeless tobacco. She reports that she does not currently use alcohol. She reports that she does not  use drugs.  ROS: Pertinent ROS in HPI  Physical Exam: BP 122/84   Pulse 76   Ht '5\' 4"'$  (1.626 m)   Wt 152 lb (68.9 kg)   LMP  (LMP Unknown)   BMI 26.09 kg/m   Constitutional:  Well nourished. Alert and oriented, No acute distress. HEENT: [place] AT, mask in place.   Trachea midline. Cardiovascular: No clubbing, cyanosis, or edema. Respiratory: Normal respiratory effort, no increased work of breathing. Neurologic: Grossly intact, no focal deficits, moving all 4 extremities. Psychiatric: Normal mood and affect.  Laboratory Data: Lab Results  Component Value Date   WBC 5.5 08/09/2022   HGB 10.9 (L) 08/09/2022   HCT 33.4 (L) 08/09/2022   MCV 87.9 08/09/2022   PLT 274 08/09/2022    Lab Results  Component Value Date   CREATININE 1.25 (H) 08/09/2022    Urinalysis See Epic and HPI I have reviewed the labs.   Pertinent Imaging: KUB shows stone fragments have migrated into the left UVJ I have independently reviewed the films.  Radiologist interpretation pending  Assessment & Plan:    1. Left proximal stone  -left UVJ stone -will schedule retreat left ESWL -Explained to the patient that with ESWL, shock waves are focused on the stone using X-rays to pinpoint the stone.  The shock waves are fired repeatedly which usually causes the stone to break into small pieces which pass out in the urine over the next few weeks.  They will  go home that day and may be able to resume normal activities in three days.  They should be given a strainer and they need to collect any fragments/sediments that they pass for analysis.   Risks involved with the procedure consist of bruising of the skin and kidney, possible long-term kidney damage, development of HTN, damage to the bowel, spleen, liver, pancreas, female organs or lungs, hematuria, hematuria serious enough to require transfusion or surgical repair, formation of a hematoma, infection or sepsis.  If the stone is too large or dense, it may not break apart or break apart in large pieces and cause "Steinstrasse."  If this happens, it would result in the need for another procedure (likely URS/LL/stent placement).  IV sedation is typically used, but in rare instances general anesthesia is used with the risk of irregular heart beat, irregular BP, stroke, MI, CVA, paralysis, coma and/or death.   2. Suspected UTI -UA -Urine culture -Keflex 500 mg twice daily for seven days  -start today   Return for left ESWL .  These notes generated with voice recognition software. I apologize for typographical errors.  [place], [place] [address]  [place], [place] [address] [phone]

## 2022-09-05 ENCOUNTER — Ambulatory Visit (INDEPENDENT_AMBULATORY_CARE_PROVIDER_SITE_OTHER): Payer: Federal, State, Local not specified - PPO | Admitting: Urology

## 2022-09-05 ENCOUNTER — Other Ambulatory Visit: Payer: Self-pay | Admitting: Urology

## 2022-09-05 ENCOUNTER — Encounter: Payer: Self-pay | Admitting: Urology

## 2022-09-05 ENCOUNTER — Ambulatory Visit
Admission: RE | Admit: 2022-09-05 | Discharge: 2022-09-05 | Disposition: A | Discharge status: Home / Self Care | Payer: Federal, State, Local not specified - PPO | Source: Ambulatory Visit | Attending: Urology | Admitting: Urology

## 2022-09-05 ENCOUNTER — Ambulatory Visit
Admission: RE | Admit: 2022-09-05 | Discharge: 2022-09-05 | Disposition: A | Discharge status: Home / Self Care | Payer: Federal, State, Local not specified - PPO | Attending: Urology | Admitting: Urology

## 2022-09-05 VITALS — BP 122/84 | HR 76 | Ht 64.0 in | Wt 152.0 lb

## 2022-09-05 DIAGNOSIS — N201 Calculus of ureter: Secondary | ICD-10-CM

## 2022-09-05 DIAGNOSIS — R3989 Other symptoms and signs involving the genitourinary system: Secondary | ICD-10-CM

## 2022-09-05 DIAGNOSIS — R3915 Urgency of urination: Secondary | ICD-10-CM | POA: Diagnosis not present

## 2022-09-05 DIAGNOSIS — R109 Unspecified abdominal pain: Secondary | ICD-10-CM

## 2022-09-05 DIAGNOSIS — R350 Frequency of micturition: Secondary | ICD-10-CM

## 2022-09-05 LAB — URINALYSIS, COMPLETE
Bilirubin, UA: NEGATIVE
Glucose, UA: NEGATIVE
Ketones, UA: NEGATIVE
Nitrite, UA: NEGATIVE
Protein,UA: NEGATIVE
Specific Gravity, UA: 1.02 (ref 1.005–1.030)
Urobilinogen, Ur: 0.2 mg/dL (ref 0.2–1.0)
pH, UA: 5.5 (ref 5.0–7.5)

## 2022-09-05 LAB — MICROSCOPIC EXAMINATION: WBC, UA: >30 /hpf — AB (ref 0–5)

## 2022-09-05 MED ORDER — CEPHALEXIN 500 MG PO CAPS: 500.0000 mg | ORAL_CAPSULE | Freq: Two times a day (BID) | ORAL | 0 refills | Status: DC

## 2022-09-05 NOTE — Progress Notes (Signed)
ESWL ORDER FORM  Expected date of procedure: 09/07/2022  Surgeon: John Giovanni, MD  Post op standing: 2-4wk follow up w/KUB prior  Anticoagulation/Aspirin/NSAID standing order: Hold all 72 hours prior  Anesthesia standing order: MAC  VTE standing: SCD's  Dx: Left Ureteral Stone  Procedure: left Extracorporeal shock wave lithotripsy  CPT : 91660  Standing Order Set:   *NPO after mn, KUB  *NS 114m/hr, Keflex 5040mPO, Benadryl 2534mO, Valium 1m46m, Zofran 4mg 34m   Medications if other than standing orders:   NONE

## 2022-09-07 ENCOUNTER — Ambulatory Visit: Payer: Federal, State, Local not specified - PPO | Admitting: Urology

## 2022-09-07 ENCOUNTER — Other Ambulatory Visit: Payer: Federal, State, Local not specified - PPO

## 2022-09-07 ENCOUNTER — Encounter
Admission: RE | Disposition: A | Discharge status: Home / Self Care | Payer: Self-pay | Source: Home / Self Care | Attending: Urology

## 2022-09-07 ENCOUNTER — Ambulatory Visit
Admission: RE | Admit: 2022-09-07 | Discharge: 2022-09-07 | Disposition: A | Discharge status: Home / Self Care | Payer: Federal, State, Local not specified - PPO | Attending: Urology | Admitting: Urology

## 2022-09-07 ENCOUNTER — Encounter: Payer: Self-pay | Admitting: Urology

## 2022-09-07 ENCOUNTER — Ambulatory Visit: Payer: Federal, State, Local not specified - PPO

## 2022-09-07 ENCOUNTER — Other Ambulatory Visit: Payer: Self-pay

## 2022-09-07 DIAGNOSIS — N200 Calculus of kidney: Secondary | ICD-10-CM | POA: Diagnosis present

## 2022-09-07 DIAGNOSIS — I10 Essential (primary) hypertension: Secondary | ICD-10-CM | POA: Diagnosis not present

## 2022-09-07 DIAGNOSIS — Z01818 Encounter for other preprocedural examination: Secondary | ICD-10-CM

## 2022-09-07 DIAGNOSIS — N201 Calculus of ureter: Secondary | ICD-10-CM

## 2022-09-07 DIAGNOSIS — Z87440 Personal history of urinary (tract) infections: Secondary | ICD-10-CM | POA: Insufficient documentation

## 2022-09-07 DIAGNOSIS — G4730 Sleep apnea, unspecified: Secondary | ICD-10-CM | POA: Diagnosis not present

## 2022-09-07 HISTORY — PX: EXTRACORPOREAL SHOCK WAVE LITHOTRIPSY: SHX1557

## 2022-09-07 LAB — POCT PREGNANCY, URINE: Preg Test, Ur: NEGATIVE

## 2022-09-07 SURGERY — LITHOTRIPSY, ESWL
Anesthesia: Moderate Sedation | Laterality: Left

## 2022-09-07 MED ORDER — CEPHALEXIN 500 MG PO CAPS: 500.0000 mg | ORAL_CAPSULE | Freq: Once | ORAL | Status: AC

## 2022-09-07 MED ORDER — DIPHENHYDRAMINE HCL 25 MG PO CAPS: 25.0000 mg | ORAL_CAPSULE | ORAL | Status: AC

## 2022-09-07 MED ORDER — TAMSULOSIN HCL 0.4 MG PO CAPS: 0.4000 mg | ORAL_CAPSULE | Freq: Every day | ORAL | 0 refills | Status: DC

## 2022-09-07 MED ORDER — CEPHALEXIN 500 MG PO CAPS
ORAL_CAPSULE | ORAL | Status: AC
  Administered 2022-09-07: 500 mg via ORAL
  Filled 2022-09-07: qty 1

## 2022-09-07 MED ORDER — SODIUM CHLORIDE 0.9 % IV SOLN: INTRAVENOUS | Status: DC

## 2022-09-07 MED ORDER — OXYCODONE HCL 5 MG PO TABS: 5.0000 mg | ORAL_TABLET | Freq: Three times a day (TID) | ORAL | 0 refills | Status: DC | PRN

## 2022-09-07 MED ORDER — DIPHENHYDRAMINE HCL 25 MG PO CAPS
ORAL_CAPSULE | ORAL | Status: AC
  Administered 2022-09-07: 25 mg via ORAL
  Filled 2022-09-07: qty 1

## 2022-09-07 MED ORDER — ONDANSETRON HCL 4 MG/2ML IJ SOLN: 4.0000 mg | Freq: Once | INTRAMUSCULAR | Status: AC

## 2022-09-07 MED ORDER — ONDANSETRON HCL 4 MG/2ML IJ SOLN
INTRAMUSCULAR | Status: AC
  Administered 2022-09-07: 4 mg via INTRAVENOUS
  Filled 2022-09-07: qty 2

## 2022-09-07 MED ORDER — DIAZEPAM 5 MG PO TABS: 10.0000 mg | ORAL_TABLET | ORAL | Status: AC

## 2022-09-07 MED ORDER — DIAZEPAM 5 MG PO TABS
ORAL_TABLET | ORAL | Status: AC
  Administered 2022-09-07: 10 mg via ORAL
  Filled 2022-09-07: qty 2

## 2022-09-07 NOTE — Discharge Instructions (Addendum)
AMBULATORY SURGERY  DISCHARGE INSTRUCTIONS   The drugs that you were given will stay in your system until tomorrow so for the next 24 hours you should not:  Drive an automobile Make any legal decisions Drink any alcoholic beverage   You may resume regular meals tomorrow.  Today it is better to start with liquids and gradually work up to solid foods.  You may eat anything you prefer, but it is better to start with liquids, then soup and crackers, and gradually work up to solid foods.   Please notify your doctor immediately if you have any unusual bleeding, trouble breathing, redness and pain at the surgery site, drainage, fever, or pain not relieved by medication.    Additional Instructions:  Extracorporeal shock wave lithotripsy Postoperative visit- If you have not previously scheduled your follow up appointment, someone from [facility] office  will be contacting you to schedule.  As per the [facility] discharge instructions A prescription for pain medication was sent to your pharmacy A prescription for tamsulosin which will help you pass stone fragments was also sent to your pharmacy Call [facility] at [phone] for pain not controlled with oral medications or fever greater than 101 degrees Our office will contact you to schedule a follow-up appointment with x-ray      Please contact your physician with any problems or Same Day Surgery at [phone], Monday through Friday 6 am to 4 pm, or [place] at [facility] number at [phone].

## 2022-09-07 NOTE — Interval H&P Note (Signed)
History and Physical Interval Note:  09/07/2022 9:04 AM  Natasha Estrada  has presented today for surgery, with the diagnosis of Left Ureteral Stone.  The various methods of treatment have been discussed with the patient and family. After consideration of risks, benefits and other options for treatment, the patient has consented to  Procedure(s): EXTRACORPOREAL SHOCK WAVE LITHOTRIPSY (ESWL) (Left) as a surgical intervention.  The patient's history has been reviewed, patient examined, no change in status, stable for surgery.  I have reviewed the patient's chart and labs.  Questions were answered to the patient's satisfaction.     [place]

## 2022-09-08 ENCOUNTER — Encounter: Payer: Self-pay | Admitting: Urology

## 2022-09-08 ENCOUNTER — Telehealth: Payer: Self-pay | Admitting: Family Medicine

## 2022-09-08 LAB — CULTURE, URINE COMPREHENSIVE

## 2022-09-08 NOTE — Telephone Encounter (Signed)
Patient notified and voiced understanding. Patient states she is doing well post ESWL and has been passing fragments, she is keeping them.

## 2022-09-08 NOTE — Telephone Encounter (Signed)
-----   Message from [name], PA-C sent at 09/08/2022  9:42 AM EDT ----- Please let Natasha Estrada know that her urine culture was negative for infection and that it just grew out normal bacteria.  How is she doing after the ESWL?

## 2022-09-12 ENCOUNTER — Ambulatory Visit
Admission: RE | Admit: 2022-09-12 | Discharge: 2022-09-12 | Disposition: A | Discharge status: Home / Self Care | Payer: Federal, State, Local not specified - PPO | Source: Ambulatory Visit | Attending: Internal Medicine | Admitting: Internal Medicine

## 2022-09-12 DIAGNOSIS — R0602 Shortness of breath: Secondary | ICD-10-CM | POA: Insufficient documentation

## 2022-09-13 ENCOUNTER — Other Ambulatory Visit: Payer: Federal, State, Local not specified - PPO

## 2022-09-14 LAB — CALCULI, WITH PHOTOGRAPH (CLINICAL LAB)
Calcium Oxalate Dihydrate: 30 %
Calcium Oxalate Monohydrate: 70 %
Weight Calculi: 5 mg

## 2022-09-15 ENCOUNTER — Emergency Department: Payer: Federal, State, Local not specified - PPO

## 2022-09-15 ENCOUNTER — Emergency Department
Admission: EM | Admit: 2022-09-15 | Discharge: 2022-09-15 | Disposition: A | Discharge status: Home / Self Care | Payer: Federal, State, Local not specified - PPO | Attending: Emergency Medicine | Admitting: Emergency Medicine

## 2022-09-15 ENCOUNTER — Other Ambulatory Visit: Payer: Self-pay

## 2022-09-15 ENCOUNTER — Encounter: Payer: Self-pay | Admitting: Intensive Care

## 2022-09-15 DIAGNOSIS — S59912A Unspecified injury of left forearm, initial encounter: Secondary | ICD-10-CM | POA: Diagnosis present

## 2022-09-15 DIAGNOSIS — M25532 Pain in left wrist: Secondary | ICD-10-CM | POA: Diagnosis not present

## 2022-09-15 DIAGNOSIS — S52572A Other intraarticular fracture of lower end of left radius, initial encounter for closed fracture: Secondary | ICD-10-CM

## 2022-09-15 DIAGNOSIS — W010XXA Fall on same level from slipping, tripping and stumbling without subsequent striking against object, initial encounter: Secondary | ICD-10-CM | POA: Diagnosis not present

## 2022-09-15 MED ORDER — HYDROCODONE-ACETAMINOPHEN 5-325 MG PO TABS
1.0000 | ORAL_TABLET | Freq: Once | ORAL | Status: AC
  Administered 2022-09-15: 1 via ORAL
  Filled 2022-09-15: qty 1

## 2022-09-15 NOTE — ED Triage Notes (Signed)
Patient arrived by POV for fall. Patient reports mechanical fall due to back issues. Denies LOC

## 2022-09-15 NOTE — ED Provider Notes (Signed)
[facility] Provider Note    Event Date/Time   First MD Initiated Contact with Patient 09/15/22 1854     (approximate)   History   Fall   HPI  Natasha Estrada is a 51 y.o. female who presents to the emergency department following a fall.  Patient states that she got tripped up on her feet and fell forward landing on her left wrist.  Complaining of pain to her left wrist.  Endorses prior broken bones in the past but denies a prior broken wrist on the left hand.  Does endorse prior rotator cuff surgeries.  Denies preceding chest pain or shortness of breath.  Complaining of pain to her left wrist.  Mild pain to her back.  Denies any numbness.     Physical Exam   Triage Vital Signs: ED Triage Vitals  Enc Vitals Group     BP 09/15/22 1721 (!) 131/94     Pulse Rate 09/15/22 1721 64     Resp 09/15/22 1721 16     Temp 09/15/22 1721 98.3 F (36.8 C)     Temp Source 09/15/22 1721 Oral     SpO2 09/15/22 1721 96 %     Weight 09/15/22 1842 150 lb (68 kg)     Height 09/15/22 1842 '5\' 4"'$  (1.626 m)     Head Circumference --      Peak Flow --      Pain Score 09/15/22 1842 9     Pain Loc --      Pain Edu? --      Excl. in [place]? --     Most recent vital signs: Vitals:   09/15/22 1721  BP: (!) 131/94  Pulse: 64  Resp: 16  Temp: 98.3 F (36.8 C)  SpO2: 96%    Physical Exam HENT:     Head: Atraumatic.     Right Ear: External ear normal.     Left Ear: External ear normal.  Eyes:     Extraocular Movements: Extraocular movements intact.     Pupils: Pupils are equal, round, and reactive to light.  Neck:     Comments: No midline cervical spine tenderness Cardiovascular:     Rate and Rhythm: Normal rate.  Pulmonary:     Effort: No respiratory distress.  Abdominal:     General: There is no distension.  Musculoskeletal:        General: Tenderness: No midline thoracic or lumbar tenderness to palpation.     Cervical back: No tenderness.     Comments:  swelling to the left wrist with overlying ecchymosis.  No open wounds.  +2 radial pulses. Sensation intact throughout the forearm and hand. Good capillary refill.   Neurological:     General: No focal deficit present.     Mental Status: She is alert.         IMPRESSION / MDM / ASSESSMENT AND PLAN / ED COURSE  I reviewed the triage vital signs and the nursing notes. 51 year old presents to the emergency department following a nonsyncopal fall.  Not on anticoagulation.  ABCs intact.  GCS 15.  Patient with no prodromal symptoms have a low suspicion for ACS, dysrhythmia, infectious process or dehydration as a cause of her fall.  Based on Nexus criteria have a low suspicion for cervical spine fracture.  No midline tenderness.  CT scans and x-ray imaging independently reviewed.  Distal radius fracture.  On reevaluation patient also with tenderness of her elbow.  Added on an  x-ray of her left elbow.  Patient was given Percocet for pain control.     Patient with no open wounds.  Neurovascularly intact.  X-ray of the elbow was independently reviewed with no findings of fracture or dislocation.  Splint applied and reevaluation continued to have good capillary refill.  Patient states that she will follow-up with her orthopedic doctor.  She has Percocet pain control at home.   ED Results / Procedures / Treatments   Labs (all labs ordered are listed, but only abnormal results are displayed) Labs interpreted as -  Labs Reviewed - No data to display   EKG     RADIOLOGY My interpretation of imaging:  Distal radial fracture intra-articular.  No significant dislocation noted.    PROCEDURES:  Critical Care performed: No  .Splint Application  Date/Time: 09/15/2022 7:25 PM  Performed by: Nathaniel Man, MD Authorized by: Nathaniel Man, MD   Consent:    Consent obtained:  Verbal   Consent given by:  Patient   Risks, benefits, and alternatives were discussed: yes     Risks  discussed:  Discoloration, numbness, pain and swelling   Alternatives discussed:  No treatment and delayed treatment Universal protocol:    Procedure explained and questions answered to patient or proxy's satisfaction: yes     Relevant documents present and verified: yes     Test results available: yes     Imaging studies available: yes     Required blood products, implants, devices, and special equipment available: yes     Site/side marked: yes     Immediately prior to procedure a time out was called: yes     Patient identity confirmed:  Verbally with patient Pre-procedure details:    Distal neurologic exam:  Normal   Distal perfusion: distal pulses strong   Procedure details:    Location:  Wrist   Wrist location:  L wrist   Cast type:  Short arm   Supplies:  Fiberglass   Attestation: Splint applied and adjusted personally by me   Post-procedure details:    Distal neurologic exam:  Normal   Distal perfusion: distal pulses strong     Procedure completion:  Tolerated   Patient's presentation is most consistent with acute presentation with potential threat to life or bodily function.   MEDICATIONS ORDERED IN ED: Medications  HYDROcodone-acetaminophen (NORCO/VICODIN) 5-325 MG per tablet 1 tablet (1 tablet Oral Given 09/15/22 1847)    FINAL CLINICAL IMPRESSION(S) / ED DIAGNOSES   Final diagnoses:  Other closed intra-articular fracture of distal end of left radius, initial encounter     Rx / DC Orders   ED Discharge Orders     None        Note:  This document was prepared using Dragon voice recognition software and may include unintentional dictation errors.   Nathaniel Man, MD 09/15/22 2008

## 2022-09-15 NOTE — ED Provider Triage Note (Signed)
  Emergency Medicine Provider Triage Evaluation Note  Natasha Estrada , a 51 y.o.female,  was evaluated in triage.  Pt complains of injuries from fall.  Patient states that she lost her balance and fell on her left side onto concrete.  Currently nursing pain in her left wrist, left side of her chest, lumbar region, and thoracic region.   Review of Systems  Positive: Chest pain, wrist pain, back pain Negative: Denies fever, chest pain, vomiting  Physical Exam  There were no vitals filed for this visit. Gen:   Awake, no distress   Resp:  Normal effort  MSK:   Moves extremities without difficulty  Other:  Left upper extremity held in a sling at this time.  She is guarding her left wrist.  Medical Decision Making  Given the patient's initial medical screening exam, the following diagnostic evaluation has been ordered. The patient will be placed in the appropriate treatment space, once one is available, to complete the evaluation and treatment. I have discussed the plan of care with the patient and I have advised the patient that an ED physician or mid-level practitioner will reevaluate their condition after the test results have been received, as the results may give them additional insight into the type of treatment they may need.    Diagnostics: Wrist x-ray, lumbar x-ray, thoracic x-ray, chest x-ray  Treatments: none immediately   Teodoro Spray, PA 09/15/22 1716

## 2022-09-18 ENCOUNTER — Other Ambulatory Visit: Payer: Self-pay | Admitting: Urology

## 2022-09-20 ENCOUNTER — Other Ambulatory Visit: Payer: Self-pay | Admitting: Orthopedic Surgery

## 2022-09-21 ENCOUNTER — Encounter: Payer: Self-pay | Admitting: Orthopedic Surgery

## 2022-09-21 ENCOUNTER — Ambulatory Visit: Payer: Federal, State, Local not specified - PPO | Admitting: Urology
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
## 2022-09-22 ENCOUNTER — Other Ambulatory Visit: Payer: Self-pay

## 2022-09-22 ENCOUNTER — Encounter: Payer: Self-pay | Admitting: Orthopedic Surgery

## 2022-09-22 ENCOUNTER — Encounter
Admission: RE | Disposition: A | Discharge status: Home / Self Care | Payer: Self-pay | Source: Ambulatory Visit | Attending: Orthopedic Surgery

## 2022-09-22 ENCOUNTER — Ambulatory Visit: Payer: Federal, State, Local not specified - PPO | Admitting: Anesthesiology

## 2022-09-22 ENCOUNTER — Ambulatory Visit
Admission: RE | Admit: 2022-09-22 | Discharge: 2022-09-22 | Disposition: A | Discharge status: Home / Self Care | Payer: Federal, State, Local not specified - PPO | Source: Ambulatory Visit | Attending: Orthopedic Surgery | Admitting: Orthopedic Surgery

## 2022-09-22 DIAGNOSIS — M545 Low back pain, unspecified: Secondary | ICD-10-CM | POA: Diagnosis not present

## 2022-09-22 DIAGNOSIS — Z79899 Other long term (current) drug therapy: Secondary | ICD-10-CM | POA: Diagnosis not present

## 2022-09-22 DIAGNOSIS — Z8261 Family history of arthritis: Secondary | ICD-10-CM | POA: Diagnosis not present

## 2022-09-22 DIAGNOSIS — Z8249 Family history of ischemic heart disease and other diseases of the circulatory system: Secondary | ICD-10-CM | POA: Diagnosis not present

## 2022-09-22 DIAGNOSIS — W19XXXA Unspecified fall, initial encounter: Secondary | ICD-10-CM | POA: Diagnosis not present

## 2022-09-22 DIAGNOSIS — F32A Depression, unspecified: Secondary | ICD-10-CM | POA: Diagnosis not present

## 2022-09-22 DIAGNOSIS — G8929 Other chronic pain: Secondary | ICD-10-CM | POA: Diagnosis not present

## 2022-09-22 DIAGNOSIS — S52542A Smith's fracture of left radius, initial encounter for closed fracture: Secondary | ICD-10-CM | POA: Diagnosis present

## 2022-09-22 DIAGNOSIS — I10 Essential (primary) hypertension: Secondary | ICD-10-CM | POA: Diagnosis not present

## 2022-09-22 DIAGNOSIS — F419 Anxiety disorder, unspecified: Secondary | ICD-10-CM | POA: Insufficient documentation

## 2022-09-22 DIAGNOSIS — Y92008 Other place in unspecified non-institutional (private) residence as the place of occurrence of the external cause: Secondary | ICD-10-CM | POA: Insufficient documentation

## 2022-09-22 DIAGNOSIS — Z9884 Bariatric surgery status: Secondary | ICD-10-CM | POA: Insufficient documentation

## 2022-09-22 DIAGNOSIS — G4730 Sleep apnea, unspecified: Secondary | ICD-10-CM | POA: Diagnosis not present

## 2022-09-22 DIAGNOSIS — K219 Gastro-esophageal reflux disease without esophagitis: Secondary | ICD-10-CM | POA: Insufficient documentation

## 2022-09-22 DIAGNOSIS — M1990 Unspecified osteoarthritis, unspecified site: Secondary | ICD-10-CM | POA: Diagnosis not present

## 2022-09-22 HISTORY — DX: Cardiac murmur, unspecified: R01.1

## 2022-09-22 HISTORY — PX: OPEN REDUCTION INTERNAL FIXATION (ORIF) DISTAL RADIAL FRACTURE: SHX5989

## 2022-09-22 SURGERY — OPEN REDUCTION INTERNAL FIXATION (ORIF) DISTAL RADIUS FRACTURE
Anesthesia: General | Site: Hand | Laterality: Left

## 2022-09-22 MED ORDER — FENTANYL CITRATE (PF) 100 MCG/2ML IJ SOLN
INTRAMUSCULAR | Status: DC | PRN
  Administered 2022-09-22: 25 ug via INTRAVENOUS
  Administered 2022-09-22: 50 ug via INTRAVENOUS
  Administered 2022-09-22: 25 ug via INTRAVENOUS

## 2022-09-22 MED ORDER — ACETAMINOPHEN 325 MG PO TABS: 325.0000 mg | ORAL_TABLET | Freq: Four times a day (QID) | ORAL | Status: DC | PRN

## 2022-09-22 MED ORDER — HYDROCODONE-ACETAMINOPHEN 5-325 MG PO TABS: 1.0000 | ORAL_TABLET | ORAL | Status: DC | PRN

## 2022-09-22 MED ORDER — EPHEDRINE SULFATE (PRESSORS) 50 MG/ML IJ SOLN
INTRAMUSCULAR | Status: DC | PRN
  Administered 2022-09-22: 15 mg via INTRAVENOUS
  Administered 2022-09-22: 5 mg via INTRAVENOUS

## 2022-09-22 MED ORDER — OXYCODONE HCL 5 MG/5ML PO SOLN: 5.0000 mg | Freq: Once | ORAL | Status: AC | PRN

## 2022-09-22 MED ORDER — ONDANSETRON HCL 4 MG/2ML IJ SOLN
INTRAMUSCULAR | Status: DC | PRN
  Administered 2022-09-22: 4 mg via INTRAVENOUS

## 2022-09-22 MED ORDER — GLYCOPYRROLATE 0.2 MG/ML IJ SOLN
INTRAMUSCULAR | Status: DC | PRN
  Administered 2022-09-22: .2 mg via INTRAVENOUS

## 2022-09-22 MED ORDER — OXYCODONE HCL 5 MG PO TABS: 5.0000 mg | ORAL_TABLET | Freq: Three times a day (TID) | ORAL | 0 refills | Status: AC | PRN

## 2022-09-22 MED ORDER — ACETAMINOPHEN 500 MG PO TABS
1000.0000 mg | ORAL_TABLET | Freq: Once | ORAL | Status: AC
  Administered 2022-09-22: 1000 mg via ORAL

## 2022-09-22 MED ORDER — PROPOFOL 10 MG/ML IV BOLUS
INTRAVENOUS | Status: DC | PRN
  Administered 2022-09-22: 100 mg via INTRAVENOUS

## 2022-09-22 MED ORDER — MIDAZOLAM HCL 5 MG/5ML IJ SOLN
INTRAMUSCULAR | Status: DC | PRN
  Administered 2022-09-22: 2 mg via INTRAVENOUS

## 2022-09-22 MED ORDER — DROPERIDOL 2.5 MG/ML IJ SOLN: 0.6250 mg | Freq: Once | INTRAMUSCULAR | Status: DC | PRN

## 2022-09-22 MED ORDER — HYDROCODONE-ACETAMINOPHEN 7.5-325 MG PO TABS: 1.0000 | ORAL_TABLET | ORAL | Status: DC | PRN

## 2022-09-22 MED ORDER — OXYCODONE HCL 5 MG PO TABS
5.0000 mg | ORAL_TABLET | Freq: Once | ORAL | Status: AC | PRN
  Administered 2022-09-22: 5 mg via ORAL

## 2022-09-22 MED ORDER — FENTANYL CITRATE PF 50 MCG/ML IJ SOSY
25.0000 ug | PREFILLED_SYRINGE | INTRAMUSCULAR | Status: DC | PRN
  Administered 2022-09-22: 50 ug via INTRAVENOUS

## 2022-09-22 MED ORDER — LIDOCAINE HCL (CARDIAC) PF 100 MG/5ML IV SOSY
PREFILLED_SYRINGE | INTRAVENOUS | Status: DC | PRN
  Administered 2022-09-22: 100 mg via INTRATRACHEAL

## 2022-09-22 MED ORDER — VANCOMYCIN HCL IN DEXTROSE 1-5 GM/200ML-% IV SOLN
1000.0000 mg | INTRAVENOUS | Status: AC
  Administered 2022-09-22: 1000 mg via INTRAVENOUS

## 2022-09-22 MED ORDER — ACETAMINOPHEN 10 MG/ML IV SOLN: 1000.0000 mg | Freq: Once | INTRAVENOUS | Status: DC | PRN

## 2022-09-22 MED ORDER — DEXAMETHASONE SODIUM PHOSPHATE 4 MG/ML IJ SOLN
INTRAMUSCULAR | Status: DC | PRN
  Administered 2022-09-22: 4 mg via INTRAVENOUS

## 2022-09-22 MED ORDER — ONDANSETRON HCL 4 MG/2ML IJ SOLN: 4.0000 mg | Freq: Four times a day (QID) | INTRAMUSCULAR | Status: DC | PRN

## 2022-09-22 MED ORDER — MORPHINE SULFATE (PF) 2 MG/ML IV SOLN: 0.5000 mg | INTRAVENOUS | Status: DC | PRN

## 2022-09-22 MED ORDER — BUPIVACAINE HCL (PF) 0.5 % IJ SOLN
INTRAMUSCULAR | Status: DC | PRN
  Administered 2022-09-22: 10 mL

## 2022-09-22 MED ORDER — 0.9 % SODIUM CHLORIDE (POUR BTL) OPTIME
TOPICAL | Status: DC | PRN
  Administered 2022-09-22: 500 mL

## 2022-09-22 MED ORDER — SODIUM CHLORIDE 0.9 % IV SOLN: INTRAVENOUS | Status: DC

## 2022-09-22 MED ORDER — METOPROLOL TARTRATE 5 MG/5ML IV SOLN
INTRAVENOUS | Status: DC | PRN
  Administered 2022-09-22 (×2): 1 mg via INTRAVENOUS

## 2022-09-22 MED ORDER — ONDANSETRON HCL 4 MG PO TABS: 4.0000 mg | ORAL_TABLET | Freq: Four times a day (QID) | ORAL | Status: DC | PRN

## 2022-09-22 MED ORDER — PROMETHAZINE HCL 25 MG/ML IJ SOLN: 6.2500 mg | INTRAMUSCULAR | Status: DC | PRN

## 2022-09-22 MED ORDER — LACTATED RINGERS IV SOLN: INTRAVENOUS | Status: DC

## 2022-09-22 SURGICAL SUPPLY — 40 items
APL PRP STRL LF DISP 70% ISPRP (MISCELLANEOUS) ×1
BIT DRILL 2 FAST STEP (BIT) IMPLANT
BIT DRILL 2.5X4 QC (BIT) IMPLANT
BNDG ELASTIC 4X5.8 VLCR STR LF (GAUZE/BANDAGES/DRESSINGS) ×1 IMPLANT
CHLORAPREP W/TINT 26 (MISCELLANEOUS) ×1 IMPLANT
CUFF TOURN SGL QUICK 18X4 (TOURNIQUET CUFF) IMPLANT
DRAPE FLUOR MINI C-ARM 54X84 (DRAPES) ×1 IMPLANT
ELECT REM PT RETURN 9FT ADLT (ELECTROSURGICAL) ×1
ELECTRODE REM PT RTRN 9FT ADLT (ELECTROSURGICAL) ×1 IMPLANT
GAUZE SPONGE 4X4 12PLY STRL (GAUZE/BANDAGES/DRESSINGS) ×1 IMPLANT
GAUZE XEROFORM 1X8 LF (GAUZE/BANDAGES/DRESSINGS) ×2 IMPLANT
GLOVE SURG SYN 9.0  PF PI (GLOVE) ×1
GLOVE SURG SYN 9.0 PF PI (GLOVE) ×1 IMPLANT
GOWN STRL REUS W/ TWL LRG LVL3 (GOWN DISPOSABLE) ×1 IMPLANT
GOWN STRL REUS W/TWL LRG LVL3 (GOWN DISPOSABLE) ×1
K-WIRE 1.6 (WIRE) ×1
K-WIRE FX5X1.6XNS BN SS (WIRE) ×1
KIT TURNOVER KIT A (KITS) ×1 IMPLANT
KWIRE FX5X1.6XNS BN SS (WIRE) IMPLANT
MANIFOLD NEPTUNE II (INSTRUMENTS) ×1 IMPLANT
NDL FILTER BLUNT 18X1 1/2 (NEEDLE) ×1 IMPLANT
NEEDLE FILTER BLUNT 18X1 1/2 (NEEDLE) ×1 IMPLANT
NS IRRIG 500ML POUR BTL (IV SOLUTION) ×1 IMPLANT
PACK EXTREMITY ARMC (MISCELLANEOUS) ×1 IMPLANT
PAD CAST 4YDX4 CTTN HI CHSV (CAST SUPPLIES) ×2 IMPLANT
PADDING CAST COTTON 4X4 STRL (CAST SUPPLIES) ×2
PEG SUBCHONDRAL SMOOTH 2.0X12 (Screw) IMPLANT
PEG SUBCHONDRAL SMOOTH 2.0X14 (Peg) IMPLANT
PEG SUBCHONDRAL SMOOTH 2.0X16 (Peg) IMPLANT
PEG SUBCHONDRAL SMOOTH 2.0X18 (Peg) IMPLANT
PLATE SHORT 21.6X48.9 NRRW LT (Plate) IMPLANT
SCALPEL PROTECTED #15 DISP (BLADE) ×2 IMPLANT
SCREW CORT 3.5X10 LNG (Screw) IMPLANT
SPLINT CAST 1 STEP 3X12 (MISCELLANEOUS) ×1 IMPLANT
SUT ETHILON 4-0 (SUTURE) ×1
SUT ETHILON 4-0 FS2 18XMFL BLK (SUTURE) ×1
SUT VICRYL 3-0 27IN (SUTURE) ×1 IMPLANT
SUTURE ETHLN 4-0 FS2 18XMF BLK (SUTURE) ×1 IMPLANT
SYR 3ML LL SCALE MARK (SYRINGE) ×1 IMPLANT
WATER STERILE IRR 500ML POUR (IV SOLUTION) ×1 IMPLANT

## 2022-09-22 NOTE — Anesthesia Postprocedure Evaluation (Signed)
Anesthesia Post Note  Patient: Natasha Estrada  Procedure(s) Performed: OPEN REDUCTION INTERNAL FIXATION (ORIF) DISTAL RADIUS FRACTURE (Left: Hand)  Patient location during evaluation: PACU Anesthesia Type: General Level of consciousness: awake and alert Pain management: pain level controlled Vital Signs Assessment: post-procedure vital signs reviewed and stable Respiratory status: spontaneous breathing, nonlabored ventilation and respiratory function stable Cardiovascular status: blood pressure returned to baseline and stable Postop Assessment: no apparent nausea or vomiting Anesthetic complications: no   No notable events documented.   Last Vitals:  Vitals:   09/22/22 1445 09/22/22 1500  BP: 109/77 113/75  Pulse: (!) 105 98  Resp: 16 17  Temp:  36.4 C  SpO2: 96% 96%    Last Pain:  Vitals:   09/22/22 1500  TempSrc:   PainSc: Asleep                 [place] Ouch

## 2022-09-22 NOTE — Transfer of Care (Addendum)
Immediate Anesthesia Transfer of Care Note  Patient: Natasha Estrada  Procedure(s) Performed: OPEN REDUCTION INTERNAL FIXATION (ORIF) DISTAL RADIUS FRACTURE (Left: Hand)  Patient Location: PACU  Anesthesia Type: General  Level of Consciousness: awake, alert  and patient cooperative  Airway and Oxygen Therapy: Patient Spontanous Breathing and Patient connected to supplemental oxygen  Post-op Assessment: Post-op Vital signs reviewed, Patient's Cardiovascular Status Stable, Respiratory Function Stable, Patent Airway and No signs of Nausea or vomiting  Post-op Vital Signs: Reviewed and stable. Pt discussing nerve block with attending for pain level at thsi time.  Complications: No notable events documented.

## 2022-09-22 NOTE — Anesthesia Preprocedure Evaluation (Addendum)
Anesthesia Evaluation  Patient identified by MRN, date of birth, ID band Patient awake    Reviewed: Allergy & Precautions, NPO status , Patient's Chart, lab work & pertinent test results  History of Anesthesia Complications Negative for: history of anesthetic complications  Airway Mallampati: III   Neck ROM: Full    Dental  (+)    Pulmonary sleep apnea ,    Pulmonary exam normal breath sounds clear to auscultation       Cardiovascular hypertension, Pt. on medications + DOE  Normal cardiovascular exam+ dysrhythmias (palpitations)  Rhythm:Regular Rate:Normal  ECG 10/18/21: NSR, low voltage  Stress ECHO 09/2022: INTERPRETATION  Normal Stress Echocardiogram  NORMAL RIGHT VENTRICULAR SYSTOLIC FUNCTION  TRIVIAL REGURGITATION NOTED (See above)  NO VALVULAR STENOSIS NOTED    Neuro/Psych PSYCHIATRIC DISORDERS Anxiety Depression Chronic lower back pain    GI/Hepatic GERD  Controlled and Medicated,S/p roux-en-Y gastric bypass   Endo/Other  negative endocrine ROS  Renal/GU Renal disease (nephrolithiasis)     Musculoskeletal  (+) Arthritis ,   Abdominal   Peds  Hematology negative hematology ROS (+)   Anesthesia Other Findings TMJ  Reproductive/Obstetrics Uterine fibroids                            Anesthesia Physical  Anesthesia Plan  ASA: 2  Anesthesia Plan: General   Post-op Pain Management: Tylenol PO (pre-op) and Toradol IV (intra-op)   Induction: Intravenous  PONV Risk Score and Plan: 3 and Ondansetron, Dexamethasone and Treatment may vary due to age or medical condition  Airway Management Planned: LMA  Additional Equipment:   Intra-op Plan:   Post-operative Plan: Extubation in OR  Informed Consent: I have reviewed the patients History and Physical, chart, labs and discussed the procedure including the risks, benefits and alternatives for the proposed anesthesia with the  patient or authorized representative who has indicated his/her understanding and acceptance.     Dental advisory given  Plan Discussed with: CRNA  Anesthesia Plan Comments: (Plan for preoperative interscalene nerve block and GETA.  Patient consented for risks of anesthesia including but not limited to:  - adverse reactions to medications - damage to eyes, teeth, lips or other oral mucosa - nerve damage due to positioning  - sore throat or hoarseness - damage to heart, brain, nerves, lungs, other parts of body or loss of life  Informed patient about role of CRNA in peri- and intra-operative care.  Patient voiced understanding.)       Anesthesia Quick Evaluation
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
## 2022-09-22 NOTE — Op Note (Signed)
09/22/2022  2:36 PM  PATIENT:  Natasha Estrada  51 y.o. female  PRE-OPERATIVE DIAGNOSIS:  Closed Smith's fracture of left radius with routine healing, subsequent encounter S52.542D  POST-OPERATIVE DIAGNOSIS:  Closed Smith's fracture of left radius with routine healing,   PROCEDURE:  Procedure(s) with comments: OPEN REDUCTION INTERNAL FIXATION (ORIF) DISTAL RADIUS FRACTURE (Left) - sleep apnea  SURGEON: Laurene Footman, MD  ASSISTANTS: None  ANESTHESIA:   general  EBL:  No intake/output data recorded.  BLOOD ADMINISTERED:none  DRAINS: none   LOCAL MEDICATIONS USED:  BUPIVICAINE   SPECIMEN:  No Specimen  DISPOSITION OF SPECIMEN:  N/A  COUNTS:  YES  TOURNIQUET:   Total Tourniquet Time Documented: Upper Arm (Left) - 19 minutes Total: Upper Arm (Left) - 19 minutes   IMPLANTS: [employer] innovations [employer] DVR short narrow plate with multiple smooth pegs and screws  DICTATION: .Dragon Dictation  patient was brought to the operating room and after adequate anesthesia was obtained the left arm was prepped and draped in the usual sterile fashion.  Tourniquet was applied to the upper arm and after appropriate patient identification and timeout procedures were completed tourniquet was raised to 250 mmHg.  Fingertrap traction was applied to the index and middle fingers with 7 and half pounds of traction off the end of the table.  Volar approach was made centered over the FCR tendon.  Tendon sheath incised and the tendon retracted radially protect the radial artery and associated veins.  The deep fascia was incised and the pronator was elevated off the distal and proximal fragments with exposure of the fracture site.  A hemostat was used to push the distal fragment and get the fragments to get adequate alignment with a short narrow DVR plate applied with proximal first technique using a K wire to hold the plate in position position of the plate was checked in both AP and lateral  projections.  When acceptable position was obtained the screws were placed drilling measuring and placing the screws as a volar buttress.  Distal peg holes were mostly filled 1 left open as it did not get a good bite and no penetration into the joint.  This gave an essentially anatomic alignment in both AP and lateral projections.  Traction was removed and under fluoroscopic views there is no motion of the fracture.  The wound was then irrigated with a tourniquet let down followed by infiltration of 10 cc half percent bupivacaine.  The wound was closed with 3-0 Vicryl subcutaneously and 4-0 nylon for the skin Xeroform 4 x 4 web roll and volar splint was applied followed by an Ace wrap.    PLAN OF CARE: Discharge to home after PACU  PATIENT DISPOSITION:  PACU - hemodynamically stable.

## 2022-09-22 NOTE — Anesthesia Procedure Notes (Signed)
Procedure Name: LMA Insertion Date/Time: 09/22/2022 1:59 PM  Performed by: Patience Musca., CRNAPre-anesthesia Checklist: Patient identified, Emergency Drugs available, Suction available, Patient being monitored and Timeout performed Patient Re-evaluated:Patient Re-evaluated prior to induction Oxygen Delivery Method: Circle system utilized Preoxygenation: Pre-oxygenation with 100% oxygen Induction Type: IV induction LMA: LMA inserted LMA Size: 4.0 Number of attempts: 1 Tube secured with: Tape Dental Injury: Teeth and Oropharynx as per pre-operative assessment

## 2022-09-22 NOTE — Discharge Instructions (Signed)
Keep arm elevated is much as possible Ice to the back of the wrist may help for pain today and tomorrow Work on finger motion is much as you can Call office if you are having problems [phone] Keep splint clean and dry Pain medicine as directed

## 2022-09-25 ENCOUNTER — Telehealth (INDEPENDENT_AMBULATORY_CARE_PROVIDER_SITE_OTHER): Payer: Federal, State, Local not specified - PPO | Admitting: Psychiatry

## 2022-09-25 ENCOUNTER — Encounter: Payer: Self-pay | Admitting: Psychiatry

## 2022-09-25 DIAGNOSIS — F411 Generalized anxiety disorder: Secondary | ICD-10-CM

## 2022-09-25 DIAGNOSIS — F4310 Post-traumatic stress disorder, unspecified: Secondary | ICD-10-CM | POA: Diagnosis not present

## 2022-09-25 DIAGNOSIS — F331 Major depressive disorder, recurrent, moderate: Secondary | ICD-10-CM

## 2022-09-25 MED ORDER — BUPROPION HCL ER (XL) 300 MG PO TB24: 300.0000 mg | ORAL_TABLET | Freq: Every day | ORAL | 0 refills | Status: DC

## 2022-09-25 MED ORDER — PROPRANOLOL HCL 20 MG PO TABS: 20.0000 mg | ORAL_TABLET | Freq: Every day | ORAL | 0 refills | Status: DC

## 2022-09-25 NOTE — Patient Instructions (Signed)
Continue  duloxetine 60 mg daily Continue bupropion 300 mg daily Continue buspirone 15 mg twice a day Decrease propranolol 20 mg daily Next appointment: 12/18 at 9:30

## 2022-09-26 ENCOUNTER — Encounter: Payer: Self-pay | Admitting: Orthopedic Surgery

## 2022-09-27 ENCOUNTER — Encounter: Payer: Self-pay | Admitting: Orthopedic Surgery

## 2022-10-05 ENCOUNTER — Telehealth: Payer: Self-pay

## 2022-10-05 NOTE — Telephone Encounter (Signed)
Patient left voicemail wanting someone to call her back return call to patient no answer and voicemail has not been set up unable to leave a message
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
## 2022-11-20 ENCOUNTER — Ambulatory Visit: Payer: Federal, State, Local not specified - PPO | Admitting: Psychiatry
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
## 2022-12-27 ENCOUNTER — Encounter: Payer: Self-pay | Admitting: Psychiatry

## 2022-12-27 ENCOUNTER — Telehealth (INDEPENDENT_AMBULATORY_CARE_PROVIDER_SITE_OTHER): Payer: Federal, State, Local not specified - PPO | Admitting: Psychiatry

## 2022-12-27 DIAGNOSIS — F4310 Post-traumatic stress disorder, unspecified: Secondary | ICD-10-CM

## 2022-12-27 DIAGNOSIS — R41840 Attention and concentration deficit: Secondary | ICD-10-CM

## 2022-12-27 DIAGNOSIS — F331 Major depressive disorder, recurrent, moderate: Secondary | ICD-10-CM

## 2022-12-27 DIAGNOSIS — F411 Generalized anxiety disorder: Secondary | ICD-10-CM

## 2022-12-27 MED ORDER — DULOXETINE HCL 30 MG PO CPEP: 30.0000 mg | ORAL_CAPSULE | Freq: Every day | ORAL | 2 refills | Status: DC

## 2022-12-27 NOTE — Patient Instructions (Signed)
Increase duloxetine 90 mg daily Continue bupropion 300 mg daily Continue buspirone 15 mg twice a day Discontinue propranolol  Next appointment: 3/25 at 10:30

## 2023-01-04 ENCOUNTER — Other Ambulatory Visit: Payer: Self-pay | Admitting: Obstetrics and Gynecology

## 2023-01-04 DIAGNOSIS — Z1231 Encounter for screening mammogram for malignant neoplasm of breast: Secondary | ICD-10-CM

## 2023-02-20 ENCOUNTER — Ambulatory Visit: Payer: Federal, State, Local not specified - PPO | Admitting: Psychiatry

## 2023-02-22 ENCOUNTER — Ambulatory Visit: Admitting: Physical Therapy
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
## 2023-02-26 ENCOUNTER — Encounter: Payer: Self-pay | Admitting: Psychiatry

## 2023-02-26 ENCOUNTER — Ambulatory Visit (INDEPENDENT_AMBULATORY_CARE_PROVIDER_SITE_OTHER): Payer: Federal, State, Local not specified - PPO | Admitting: Psychiatry

## 2023-02-26 VITALS — BP 112/77 | HR 87 | Temp 97.9°F | Ht 64.0 in | Wt 161.8 lb

## 2023-02-26 DIAGNOSIS — F4310 Post-traumatic stress disorder, unspecified: Secondary | ICD-10-CM

## 2023-02-26 DIAGNOSIS — F411 Generalized anxiety disorder: Secondary | ICD-10-CM | POA: Diagnosis not present

## 2023-02-26 DIAGNOSIS — F331 Major depressive disorder, recurrent, moderate: Secondary | ICD-10-CM

## 2023-02-26 NOTE — Patient Instructions (Signed)
Continue duloxetine 90 mg daily Continue bupropion 300 mg daily- Continue buspirone 15 mg twice a day Hold propranolol  Next appointment: 5/13 at 3 PM

## 2023-03-18 NOTE — Progress Notes (Unsigned)
Referring Physician:  Denton Lank, FNP [address] [place],  [place] [address]  Primary Physician:  Carren Rang, PA-C  History of Present Illness: 03/20/2023 [name] has a history of GERD, HTN, OSA, IBS, CKD.   History of gastric bypass surgery in 2004. History of kyphoplasty at L2.   Has seen Dr. 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   She sees [facility] for her neck and thoracic spine- this is under WC. Per pateint ,she was seen by Dr. Waymon Budge at [facility] and no surgery was recommended for her lower back.   Lower back is not covered under WC- she is trying to get this included.   She sees outside pain management as well for her neck and left shoulder.   She has constant LBP with intermittent left posterior/lateral leg pain to her foot x years. Minimal right leg pain. Pain is worse with standing and walking. Pain is sharp and stabbing. She has tingling and numbness in left leg. No weakness in her left leg. Last lumbar ESI did not help at all.   Bowel/Bladder Dysfunction: none  Conservative measures:  Physical therapy: has not participated in for her back  Multimodal medical therapy including regular antiinflammatories: flexeril, cymbalta, neurontin, motrin.  Injections:  11/22/2022: Left L5-S1 transforaminal ESI (no relief) 05/19/2022: Left L5-S1 transforaminal ESI (90% relief) 10/01/2020: Left C6-7 transforaminal ESI 06/24/2020: Left C4-5 facet joint injection  03/12/2020: Left C5-6 transforaminal ESI (mild temporary relief)   Past Surgery:  Kyphoplasty at L2  Natasha Estrada has some chronic balance issues.   The symptoms are causing a significant impact on the patient's life.   Review of Systems:  A 10 point review of systems is negative, except for the pertinent positives and negatives detailed in the HPI.  Past Medical History: Past Medical  History:  Diagnosis Date   Anxiety    Arthritis    Chronic low back pain with left-sided sciatica    Chronic pain syndrome    Depression    Fibroid    GERD (gastroesophageal reflux disease)    Heart murmur    Echo 09/14/22   History of kidney stones    Hypertension    IBS (irritable bowel syndrome)    with constipation   Insomnia    Kidney lesion, native, left 09/2020   following renal mass protocol on 10/14/20   Kidney stone    Lumbar radiculopathy    Radiculitis, cervical    c7-c8   Sleep apnea    can't wear CPAP - anxiety   Syrinx of spinal cord 2021   Urinary tract infection 10/2020   recurrent. on daily antibix treatment    Past Surgical History: Past Surgical History:  Procedure Laterality Date   BLADDER STONE REMOVAL  2018   CERVICAL CONE BIOPSY  2018   CHOLECYSTECTOMY     EXTRACORPOREAL SHOCK WAVE LITHOTRIPSY Left 08/17/2022   Procedure: EXTRACORPOREAL SHOCK WAVE LITHOTRIPSY (ESWL);  Surgeon: Riki Altes, MD;  Location: [facility];  Service: Urology;  Laterality: Left;   EXTRACORPOREAL SHOCK WAVE LITHOTRIPSY Left 09/07/2022   Procedure: EXTRACORPOREAL SHOCK WAVE LITHOTRIPSY (ESWL);  Surgeon: Riki Altes, MD;  Location: [facility];  Service: Urology;  Laterality: Left;   GASTRIC ROUX-EN-Y  2013   KYPHOPLASTY N/A 12/07/2020   Procedure: L2 Kyphoplasty;  Surgeon: Kennedy Bucker, MD;  Location: [facility];  Service: Orthopedics;  Laterality: N/A;  OPEN REDUCTION INTERNAL FIXATION (ORIF) DISTAL RADIAL FRACTURE Left 09/22/2022   Procedure: OPEN REDUCTION INTERNAL FIXATION (ORIF) DISTAL RADIUS FRACTURE;  Surgeon: Kennedy Bucker, MD;  Location: [facility];  Service: Orthopedics;  Laterality: Left;  sleep apnea   ROTATOR CUFF REPAIR Left 2010   SHOULDER ARTHROSCOPY WITH SUBACROMIAL DECOMPRESSION AND OPEN ROTATOR C Left 10/19/2020   Procedure: LEFT SHOULDER ARTHROSCOPY WITH DEBRIDEMENT DECOMPRESSION AND POSSIBLE BICEP TENODESIS;  Surgeon: Christena Flake, MD;   Location: [facility];  Service: Orthopedics;  Laterality: Left;   SHOULDER ARTHROSCOPY WITH SUBACROMIAL DECOMPRESSION AND OPEN ROTATOR C Left 12/08/2021   Procedure: Left shoulder arthroscopy with debridement, decompression, and rotator cuff repair;  Surgeon: Christena Flake, MD;  Location: [facility];  Service: Orthopedics;  Laterality: Left;   TONSILLECTOMY     as a child   transforaminal epidural steroid injection Left 10/01/2020   C6-7    Allergies: Allergies as of 03/20/2023 - Review Complete 03/20/2023  Allergen Reaction Noted   Cefuroxime Shortness Of Breath 12/16/2018   Sulfa antibiotics Rash 12/16/2018    Medications: Outpatient Encounter Medications as of 03/20/2023  Medication Sig   acetaminophen (TYLENOL) 325 MG tablet Take 2 tablets (650 mg total) by mouth every 6 (six) hours as needed for mild pain (or Fever >/= 101).   alendronate (FOSAMAX) 70 MG tablet Take 70 mg by mouth once a week.   atorvastatin (LIPITOR) 20 MG tablet Take 20 mg by mouth daily.   busPIRone (BUSPAR) 15 MG tablet Take 15 mg by mouth 2 (two) times daily.   Cholecalciferol (VITAMIN D3 PO) Take by mouth daily.   DULoxetine (CYMBALTA) 30 MG capsule Take 1 capsule (30 mg total) by mouth daily. Total of 90 mg daily. Take along with 60 mg cap   DULoxetine (CYMBALTA) 60 MG capsule Take 60 mg by mouth daily.   EPINEPHrine 0.3 mg/0.3 mL IJ SOAJ injection Inject 0.3 mg into the muscle once as needed (if patient exhibits significant signs and symptoms of allergic reaction.).   gabapentin (NEURONTIN) 600 MG tablet Take 600 mg by mouth daily.   hydrOXYzine (VISTARIL) 25 MG capsule Take 25 mg by mouth 3 (three) times daily.   losartan (COZAAR) 50 MG tablet Take 50 mg by mouth daily.   omeprazole (PRILOSEC) 20 MG capsule Take 20 mg by mouth daily.   oxyCODONE (ROXICODONE) 5 MG immediate release tablet Take 1 tablet (5 mg total) by mouth every 8 (eight) hours as needed.   tiZANidine (ZANAFLEX) 4 MG tablet Take 4 mg by mouth 3  (three) times daily.   trimethoprim (TRIMPEX) 100 MG tablet TAKE 1 TABLET(100 MG) BY MOUTH DAILY   VITAMIN A PO Take by mouth daily.   VITAMIN E PO Take by mouth daily.   zolpidem (AMBIEN) 10 MG tablet Take 10 mg by mouth daily.   buPROPion (WELLBUTRIN XL) 300 MG 24 hr tablet Take 1 tablet (300 mg total) by mouth daily.   No facility-administered encounter medications on file as of 03/20/2023.    Social History: Social History   Tobacco Use   Smoking status: Never   Smokeless tobacco: Never  Vaping Use   Vaping Use: Never used  Substance Use Topics   Alcohol use: Not Currently    Comment: rare   Drug use: Never    Family Medical History: Family History  Problem Relation Age of Onset   Hypertension Mother     Physical Examination: There were no vitals filed for this visit.  General: Patient  is well developed, well nourished, calm, collected, and in no apparent distress. Attention to examination is appropriate.  Respiratory: Patient is breathing without any difficulty.   NEUROLOGICAL:     Awake, alert, oriented to person, place, and time.  Speech is clear and fluent. Fund of knowledge is appropriate.   Cranial Nerves: Pupils equal round and reactive to light.  Facial tone is symmetric.    Mild posterior lumbar tenderness.   No abnormal lesions on exposed skin.   Strength: Side Biceps Triceps Deltoid Interossei Grip Wrist Ext. Wrist Flex.  R L Side Iliopsoas Quads Hamstring PF DF EHL  R L Reflexes are 2+ and symmetric at the biceps, triceps, brachioradialis, patella and achilles.   Hoffman's is absent.  Clonus is not present.   Bilateral upper and lower extremity sensation is intact to light touch.     Gait is normal.    Medical Decision Making  Imaging: Thoracic and Lumbar xrays dated 09/15/22:  EXAM: LUMBAR SPINE - COMPLETE 4+ VIEW; THORACIC SPINE 2 VIEWS   COMPARISON:  MRI thoracic spine  06/18/2022, MRI lumbar spine 05/08/2022   FINDINGS: Thoracic spine:   There are 12 rib-bearing thoracic type vertebral bodies. Minimal levocurvature centered at T7-8. No sagittal spondylolisthesis. Mild anterior T7-8 through T9 vertebral body height loss is unchanged from 06/18/2021 and chronic. Mild-to-moderate multilevel disc space narrowing of the midthoracic spine. Surgical clips overlie the upper abdomen.   Lumbar spine:   There is mild-to-moderate anterior L2 vertebral body height loss with associated augmentation cement seen superiorly, unchanged from prior. Similar retrolisthesis of L2 on L3, unchanged. Mild-to-moderate T10-11, mild T11-12, mild posterior L2-3 disc space narrowing.   IMPRESSION: 1. Mild-to-moderate anterior L2 vertebral body height loss with associated vertebroplasty cement, unchanged from prior. 2. Mild anterior T7-8 through T9 vertebral body height loss is unchanged from 06/18/2021 and chronic. 3. Mild multilevel degenerative disc changes.     Electronically Signed   By: Neita Garnet M.D.   On: 09/15/2022 18:26    Lumbar spine MRI dated 05/08/22:  FINDINGS: Segmentation:  Standard.   Alignment:  Physiologic.   Vertebrae: No acute fracture, evidence of discitis, or bone lesion. Chronic L2 compression deformity status post cement augmentation.   Conus medullaris and cauda equina: Conus extends to the L2 level. Conus and cauda equina appear normal.   Paraspinal and other soft tissues: Negative.   Disc levels:   T12-L1:  Negative.   L1-L2:  Unchanged minimal disc bulging.  No stenosis.   L2-L3: Progressive mild disc bulging. New borderline mild bilateral neuroforaminal stenosis. No spinal canal stenosis.   L3-L4: Unchanged minimal disc bulging and mild bilateral facet arthropathy. No stenosis.   L4-L5: Unchanged minimal disc bulging with new superimposed small left foraminal/extraforaminal disc protrusion displacing the exiting left  L4 nerve root. No stenosis.   L5-S1: Negative disc. Unchanged mild bilateral facet arthropathy. No stenosis.   IMPRESSION: 1. New small left foraminal/extraforaminal disc protrusion at L4-L5 displacing the exiting left L4 nerve root. 2. Progressive mild spondylosis at L2-L3 without high-grade stenosis. 3. Chronic L2 compression deformity status post cement augmentation.     Electronically Signed   By: Obie Dredge M.D.   On: 05/09/2022 16:23   I have personally reviewed the images and agree with the above interpretation.  Assessment and Plan:  Natasha Estrada is a pleasant 52 y.o. female has constant LBP with intermittent left posterior/lateral leg pain to her foot x years. Minimal right leg pain. Pain is worse with standing and walking.  She has tingling and numbness in left leg.   History of L2 kyphoplasty.   She has known lumbar spondylosis with left foraminal disc at L4-L5 that may be contributing to her leg pain.   Treatment options discussed with patient and following plan made:   - Get updated MRI of lumbar to further evaluate lumbar radiculopathy. No improvement time, ESIs, or medications.  - Discussed PT for lumbar spine. She wants to hold off for now. We reviewed she would need to have PT prior to any surgery discussions.  - Will review MRI with Dr. Myer Haff and set up phone visit to review with her.  Of note, she has some chronic balance issues- no signs of myelopathy on exam.   I spent a total of 35 minutes in face-to-face and non-face-to-face activities related to this patient's care today including review of outside records, review of imaging, review of symptoms, physical exam, discussion of differential diagnosis, discussion of treatment options, and documentation.   Thank you for involving me in the care of this patient.   Drake Leach PA-C Dept. of Neurosurgery

## 2023-03-19 NOTE — Progress Notes (Unsigned)
03/20/2023 12:43 PM   Natasha Estrada [birth year] [id number]  Referring provider: Carren Rang, PA-C [address] [place],  [place] [address]  Urological history: 1. Complex cyst -MRI (03/2022) -  9 mm mildly complex cyst in the mid right kidney, Bosniak 2.  2.  Nephrolithiasis -CT renal stone study (08/2022) -  a 1.5 cm stone in the proximal left ureter with mild left hydronephrosis. There is no hydronephrosis or nephrolithiasis on the right. Similar appearance of a 1.5 cm linear cortical calcification of the inferior right kidney -s/p ESWL (08/17/2022)   3. rUTI's - risk factors of age, constipation and incontinence - cysto 05/2020 was NED  - CT renal stone study (08/2022) - nephrolithiasis -Documented urine cultures over the last year  September 05, 2022-no growth  08/14/2022 no growth  03/28/2022 MUF  11/11/2021 E.coli  10/15/2021 E.coli  No chief complaint on file.   HPI: Natasha Estrada is a 52 y.o. female who today for follow up.  UA ***  KUB ***  PMH: Past Medical History:  Diagnosis Date   Anxiety    Arthritis    Chronic low back pain with left-sided sciatica    Chronic pain syndrome    Depression    Fibroid    GERD (gastroesophageal reflux disease)    Heart murmur    Echo 09/14/22   History of kidney stones    Hypertension    IBS (irritable bowel syndrome)    with constipation   Insomnia    Kidney lesion, native, left 09/2020   following renal mass protocol on 10/14/20   Kidney stone    Lumbar radiculopathy    Radiculitis, cervical    c7-c8   Sleep apnea    can't wear CPAP - anxiety   Syrinx of spinal cord (HCC) 2021   Urinary tract infection 10/2020   recurrent. on daily antibix treatment    Surgical History: Past Surgical History:  Procedure Laterality Date   BLADDER STONE REMOVAL  2018   CERVICAL CONE BIOPSY  2018   CHOLECYSTECTOMY     EXTRACORPOREAL SHOCK WAVE LITHOTRIPSY Left 08/17/2022   Procedure:  EXTRACORPOREAL SHOCK WAVE LITHOTRIPSY (ESWL);  Surgeon: Riki Altes, MD;  Location: [facility];  Service: Urology;  Laterality: Left;   EXTRACORPOREAL SHOCK WAVE LITHOTRIPSY Left 09/07/2022   Procedure: EXTRACORPOREAL SHOCK WAVE LITHOTRIPSY (ESWL);  Surgeon: Riki Altes, MD;  Location: [facility];  Service: Urology;  Laterality: Left;   GASTRIC ROUX-EN-Y  2013   KYPHOPLASTY N/A 12/07/2020   Procedure: L2 Kyphoplasty;  Surgeon: Kennedy Bucker, MD;  Location: [facility];  Service: Orthopedics;  Laterality: N/A;   OPEN REDUCTION INTERNAL FIXATION (ORIF) DISTAL RADIAL FRACTURE Left 09/22/2022   Procedure: OPEN REDUCTION INTERNAL FIXATION (ORIF) DISTAL RADIUS FRACTURE;  Surgeon: Kennedy Bucker, MD;  Location: [facility];  Service: Orthopedics;  Laterality: Left;  sleep apnea   ROTATOR CUFF REPAIR Left 2010   SHOULDER ARTHROSCOPY WITH SUBACROMIAL DECOMPRESSION AND OPEN ROTATOR C Left 10/19/2020   Procedure: LEFT SHOULDER ARTHROSCOPY WITH DEBRIDEMENT DECOMPRESSION AND POSSIBLE BICEP TENODESIS;  Surgeon: Christena Flake, MD;  Location: [facility];  Service: Orthopedics;  Laterality: Left;   SHOULDER ARTHROSCOPY WITH SUBACROMIAL DECOMPRESSION AND OPEN ROTATOR C Left 12/08/2021   Procedure: Left shoulder arthroscopy with debridement, decompression, and rotator cuff repair;  Surgeon: Christena Flake, MD;  Location: [facility];  Service: Orthopedics;  Laterality: Left;   TONSILLECTOMY     as a child   transforaminal epidural steroid injection Left  10/01/2020   C6-7    Home Medications:  Current Outpatient Medications on File Prior to Visit  Medication Sig Dispense Refill   acetaminophen (TYLENOL) 325 MG tablet Take 2 tablets (650 mg total) by mouth every 6 (six) hours as needed for mild pain (or Fever >/= 101).     alendronate (FOSAMAX) 70 MG tablet Take 70 mg by mouth once a week.     atorvastatin (LIPITOR) 20 MG tablet Take 20 mg by mouth daily.     buPROPion (WELLBUTRIN XL) 300 MG 24 hr tablet Take 1  tablet (300 mg total) by mouth daily. 90 tablet 0   busPIRone (BUSPAR) 15 MG tablet Take 15 mg by mouth 2 (two) times daily.     Cholecalciferol (VITAMIN D3 PO) Take by mouth daily.     DULoxetine (CYMBALTA) 30 MG capsule Take 1 capsule (30 mg total) by mouth daily. Total of 90 mg daily. Take along with 60 mg cap 30 capsule 2   DULoxetine (CYMBALTA) 60 MG capsule Take 60 mg by mouth daily.     EPINEPHrine 0.3 mg/0.3 mL IJ SOAJ injection Inject 0.3 mg into the muscle once as needed (if patient exhibits significant signs and symptoms of allergic reaction.). 1 each 0   gabapentin (NEURONTIN) 600 MG tablet Take 600 mg by mouth daily.     hydrOXYzine (VISTARIL) 25 MG capsule Take 25 mg by mouth 3 (three) times daily.     losartan (COZAAR) 50 MG tablet Take 50 mg by mouth daily.     omeprazole (PRILOSEC) 20 MG capsule Take 20 mg by mouth daily.     oxyCODONE (ROXICODONE) 5 MG immediate release tablet Take 1 tablet (5 mg total) by mouth every 8 (eight) hours as needed. 30 tablet 0   tiZANidine (ZANAFLEX) 4 MG tablet Take 4 mg by mouth 3 (three) times daily.     trimethoprim (TRIMPEX) 100 MG tablet TAKE 1 TABLET(100 MG) BY MOUTH DAILY 30 tablet 11   VITAMIN A PO Take by mouth daily.     VITAMIN E PO Take by mouth daily.     zolpidem (AMBIEN) 10 MG tablet Take 10 mg by mouth daily.     No current facility-administered medications on file prior to visit.    Allergies:  Allergies  Allergen Reactions   Cefuroxime Shortness Of Breath    1.) Tolerated 1st generation cephalosporin (CEPHALEXIN) courses on 10/17/2021 and 11/11/2021 without documented ADRs. 2.) Tolerates Augmentin per husband Joe   Sulfa Antibiotics Rash    Family History: Family History  Problem Relation Age of Onset   Hypertension Mother     Social History:  reports that she has never smoked. She has never used smokeless tobacco. She reports that she does not currently use alcohol. She reports that she does not use  drugs.  ROS: Pertinent ROS in HPI  Physical Exam: LMP  (LMP Unknown)   Constitutional:  Well nourished. Alert and oriented, No acute distress. HEENT: [place] AT, moist mucus membranes.  Trachea midline, no masses. Cardiovascular: No clubbing, cyanosis, or edema. Respiratory: Normal respiratory effort, no increased work of breathing. GU: No CVA tenderness.  No bladder fullness or masses. Vulvovaginal atrophy w/ pallor, loss of rugae, introital retraction, excoriations.  Vulvar thinning, fusion of labia, clitoral hood retraction, prominent urethral meatus.   *** external genitalia, *** pubic hair distribution, no lesions.  Normal urethral meatus, no lesions, no prolapse, no discharge.   No urethral masses, tenderness and/or tenderness. No bladder fullness, tenderness or  masses. *** vagina mucosa, *** estrogen effect, no discharge, no lesions, *** pelvic support, *** cystocele and *** rectocele noted.  No cervical motion tenderness.  Uterus is freely mobile and non-fixed.  No adnexal/parametria masses or tenderness noted.  Anus and perineum are without rashes or lesions.   ***  Neurologic: Grossly intact, no focal deficits, moving all 4 extremities. Psychiatric: Normal mood and affect.    Laboratory Data: Serum creatinine on November 15, 2022 was 1.1, EGFR of 60 Hemoglobin A1c on November 15, 2022 was 5.6 Urinalysis See Epic and HPI I have reviewed the labs.   Pertinent Imaging: KUB shows stone fragments have migrated into the left UVJ I have independently reviewed the films.  Radiologist interpretation pending  Assessment & Plan:    1.  Nephrolithiasis -KUB ***  2.  Microscopic hematuria -UA -Urine culture  3.  Incontinence -***   No follow-ups on file.  These notes generated with voice recognition software. I apologize for typographical errors.  Cloretta Ned  [facility] [address]  [address] [place], [place] [address] [phone]

## 2023-03-20 ENCOUNTER — Encounter: Payer: Self-pay | Admitting: Orthopedic Surgery

## 2023-03-20 ENCOUNTER — Ambulatory Visit: Payer: Federal, State, Local not specified - PPO | Admitting: Orthopedic Surgery

## 2023-03-20 ENCOUNTER — Ambulatory Visit
Admission: RE | Admit: 2023-03-20 | Discharge: 2023-03-20 | Disposition: A | Discharge status: Home / Self Care | Payer: Federal, State, Local not specified - PPO | Source: Ambulatory Visit | Attending: Urology | Admitting: Urology

## 2023-03-20 ENCOUNTER — Encounter: Payer: Self-pay | Admitting: Urology

## 2023-03-20 ENCOUNTER — Other Ambulatory Visit: Payer: Self-pay | Admitting: Family Medicine

## 2023-03-20 ENCOUNTER — Ambulatory Visit
Admission: RE | Admit: 2023-03-20 | Discharge: 2023-03-20 | Disposition: A | Discharge status: Home / Self Care | Payer: Federal, State, Local not specified - PPO | Attending: Urology | Admitting: Urology

## 2023-03-20 ENCOUNTER — Ambulatory Visit (INDEPENDENT_AMBULATORY_CARE_PROVIDER_SITE_OTHER): Payer: Federal, State, Local not specified - PPO | Admitting: Urology

## 2023-03-20 VITALS — BP 132/88 | HR 85 | Ht 64.0 in | Wt 158.0 lb

## 2023-03-20 VITALS — BP 130/78 | Ht 64.0 in | Wt 158.8 lb

## 2023-03-20 DIAGNOSIS — N3946 Mixed incontinence: Secondary | ICD-10-CM | POA: Diagnosis not present

## 2023-03-20 DIAGNOSIS — K5900 Constipation, unspecified: Secondary | ICD-10-CM

## 2023-03-20 DIAGNOSIS — N201 Calculus of ureter: Secondary | ICD-10-CM | POA: Insufficient documentation

## 2023-03-20 DIAGNOSIS — M47816 Spondylosis without myelopathy or radiculopathy, lumbar region: Secondary | ICD-10-CM

## 2023-03-20 DIAGNOSIS — N200 Calculus of kidney: Secondary | ICD-10-CM

## 2023-03-20 DIAGNOSIS — M4726 Other spondylosis with radiculopathy, lumbar region: Secondary | ICD-10-CM | POA: Diagnosis not present

## 2023-03-20 DIAGNOSIS — Z87442 Personal history of urinary calculi: Secondary | ICD-10-CM

## 2023-03-20 DIAGNOSIS — Z9889 Other specified postprocedural states: Secondary | ICD-10-CM | POA: Diagnosis not present

## 2023-03-20 DIAGNOSIS — R102 Pelvic and perineal pain: Secondary | ICD-10-CM | POA: Diagnosis not present

## 2023-03-20 DIAGNOSIS — M5416 Radiculopathy, lumbar region: Secondary | ICD-10-CM

## 2023-03-20 DIAGNOSIS — R3129 Other microscopic hematuria: Secondary | ICD-10-CM | POA: Diagnosis not present

## 2023-03-20 DIAGNOSIS — R1024 Suprapubic pain: Secondary | ICD-10-CM

## 2023-03-20 LAB — URINALYSIS, COMPLETE
Bilirubin, UA: NEGATIVE
Glucose, UA: NEGATIVE
Ketones, UA: NEGATIVE
Nitrite, UA: NEGATIVE
Protein,UA: NEGATIVE
RBC, UA: NEGATIVE
Specific Gravity, UA: 1.02 (ref 1.005–1.030)
Urobilinogen, Ur: 0.2 mg/dL (ref 0.2–1.0)
pH, UA: 6 (ref 5.0–7.5)

## 2023-03-20 LAB — MICROSCOPIC EXAMINATION

## 2023-03-20 NOTE — Patient Instructions (Signed)
It was so nice to see you today. Thank you so much for coming in.    Your previous MRI shows some wear and tear in your lower back with a disc at L4-L5 that may be causing your left leg pain.    I want to get an updated MRI of your lower back to look into things further. We will get this approved through your insurance and [facility] will call you to schedule the appointment.   Once the MRI results are back, I will review with Dr. Myer Haff and we will call you to schedule a phone visit with me.   Please do not hesitate to call if you have any questions or concerns. You can also message me in MyChart.   If you have not heard back about the MRI in the next week, please call the office so we can help you get it scheduled.   Drake Leach PA-C [phone]

## 2023-03-22 LAB — CULTURE, URINE COMPREHENSIVE

## 2023-03-23 ENCOUNTER — Encounter: Payer: Self-pay | Admitting: Family Medicine

## 2023-04-03 ENCOUNTER — Other Ambulatory Visit: Payer: Self-pay | Admitting: Surgery

## 2023-04-03 DIAGNOSIS — M7582 Other shoulder lesions, left shoulder: Secondary | ICD-10-CM

## 2023-04-03 DIAGNOSIS — Z9889 Other specified postprocedural states: Secondary | ICD-10-CM

## 2023-04-08 ENCOUNTER — Ambulatory Visit: Payer: Federal, State, Local not specified - PPO

## 2023-04-09 ENCOUNTER — Ambulatory Visit
Admission: RE | Admit: 2023-04-09 | Discharge: 2023-04-09 | Disposition: A | Discharge status: Home / Self Care | Source: Ambulatory Visit | Attending: Surgery | Admitting: Surgery

## 2023-04-09 ENCOUNTER — Ambulatory Visit
Admission: RE | Admit: 2023-04-09 | Discharge: 2023-04-09 | Disposition: A | Discharge status: Home / Self Care | Payer: Federal, State, Local not specified - PPO | Source: Ambulatory Visit | Attending: Orthopedic Surgery | Admitting: Orthopedic Surgery

## 2023-04-09 DIAGNOSIS — Z9889 Other specified postprocedural states: Secondary | ICD-10-CM | POA: Diagnosis not present

## 2023-04-09 DIAGNOSIS — M47816 Spondylosis without myelopathy or radiculopathy, lumbar region: Secondary | ICD-10-CM | POA: Diagnosis present

## 2023-04-09 DIAGNOSIS — M7582 Other shoulder lesions, left shoulder: Secondary | ICD-10-CM | POA: Diagnosis present

## 2023-04-09 DIAGNOSIS — M5416 Radiculopathy, lumbar region: Secondary | ICD-10-CM

## 2023-04-13 ENCOUNTER — Encounter: Payer: Self-pay | Admitting: Orthopedic Surgery

## 2023-04-13 NOTE — Progress Notes (Signed)
MRI lumbar spine dated 04/09/23:  FINDINGS: Segmentation:  Standard.   Alignment:  2 mm retrolisthesis of L2 on L3.   Vertebrae: No acute fracture, evidence of discitis, or aggressive bone lesion. Chronic L2 vertebral body compression fracture with 50% anterior height loss and methylmethacrylate within the anterior vertebral body from prior augmentation. No residual marrow edema.   Conus medullaris and cauda equina: Conus extends to the L1-2 level. Conus and cauda equina appear normal.   Paraspinal and other soft tissues: No acute paraspinal abnormality.   Disc levels:   Disc spaces: Disc desiccation at L1-2, L2-3 and L5-S1.   T12-L1: No significant disc bulge. No neural foraminal stenosis. No central canal stenosis.   L1-L2: Minimal broad-based disc bulge. No foraminal or central canal stenosis.   L2-L3: Mild broad-based disc bulge. Mild bilateral facet arthropathy. Mild bilateral foraminal stenosis. No spinal stenosis.   L3-L4: Mild broad-based disc bulge. Mild bilateral facet arthropathy. No foraminal or central canal stenosis.   L4-L5: Broad-based disc bulge. Mild bilateral facet arthropathy. No foraminal or central canal stenosis.   L5-S1: Minimal broad-based disc bulge. Mild bilateral facet arthropathy. No foraminal or central canal stenosis.   IMPRESSION: 1. No acute osseous injury of the lumbar spine. 2. Chronic L2 vertebral body compression fracture with 50% anterior height loss and prior augmentation. No residual marrow edema. 3. Mild lumbar spine spondylosis as described above.     Electronically Signed   By: Elige Ko M.D.   On: 04/13/2023 06:27  I have personally reviewed the images and agree with the above interpretation.   Above imaging reviewed with Dr. Myer Haff. He does not recommend surgery for her lumbar spine. She should follow up with pain management to discuss further treatment options. Will discuss at her follow up.
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
## 2023-04-16 ENCOUNTER — Telehealth (INDEPENDENT_AMBULATORY_CARE_PROVIDER_SITE_OTHER): Payer: Federal, State, Local not specified - PPO | Admitting: Psychiatry

## 2023-04-16 ENCOUNTER — Encounter: Payer: Self-pay | Admitting: Psychiatry

## 2023-04-16 DIAGNOSIS — F4310 Post-traumatic stress disorder, unspecified: Secondary | ICD-10-CM

## 2023-04-16 DIAGNOSIS — F330 Major depressive disorder, recurrent, mild: Secondary | ICD-10-CM | POA: Diagnosis not present

## 2023-04-16 DIAGNOSIS — F411 Generalized anxiety disorder: Secondary | ICD-10-CM

## 2023-04-18 NOTE — Progress Notes (Signed)
Telephone Visit- Progress Note: Referring Physician:  Carren Rang, PA-C [address] [place],  [place] [address]  Primary Physician:  Carren Rang, PA-C  This visit was performed via telephone.  Patient location: home Provider location: office  I spent a total of 10 minutes non-face-to-face activities for this visit on the date of this encounter including review of current clinical condition and response to treatment.    Patient has given verbal consent to this telephone visits and we reviewed the limitations of a telephone visit. Patient wishes to proceed.    Chief Complaint:  review lumbar MRI  History of Present Illness: Rajinder Rosenberry is a 52 y.o. female has a history of GERD, HTN, OSA, IBS, CKD.    History of gastric bypass surgery in 2004. History of kyphoplasty at L2.    Has seen Dr. 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    She sees [facility] for her neck and thoracic spine- this is under WC. Lower back is not covered under WC- she is trying to get this included.    She sees outside pain management as well for her neck and left shoulder.   Last seen by me on 03/20/23 for LBP and left leg pain. She has known lumbar spondylosis with left foraminal disc at L4-L5.   Updated lumbar MRI was ordered and phone visit scheduled to review it.   She has constant LBP with intermittent left posterior/lateral leg pain to her foot. She has minimal right leg pain. Pain is worse with standing and walking. No alleviating factors. She has weakness, tingling, and numbness in left leg.   Bowel/Bladder Dysfunction: none   Conservative measures:  Physical therapy: has not participated in for her back  Multimodal medical therapy including regular antiinflammatories: flexeril, cymbalta, neurontin, motrin.  Injections:  11/22/2022: Left L5-S1 transforaminal ESI (no  relief) 05/19/2022: Left L5-S1 transforaminal ESI (90% relief) 10/01/2020: Left C6-7 transforaminal ESI 06/24/2020: Left C4-5 facet joint injection  03/12/2020: Left C5-6 transforaminal ESI (mild temporary relief)    Past Surgery:  Kyphoplasty at L2   Exam: No exam done as this was a telephone encounter.     Imaging: MRI of lumbar spine dated 04/09/23:  FINDINGS: Segmentation:  Standard.   Alignment:  2 mm retrolisthesis of L2 on L3.   Vertebrae: No acute fracture, evidence of discitis, or aggressive bone lesion. Chronic L2 vertebral body compression fracture with 50% anterior height loss and methylmethacrylate within the anterior vertebral body from prior augmentation. No residual marrow edema.   Conus medullaris and cauda equina: Conus extends to the L1-2 level. Conus and cauda equina appear normal.   Paraspinal and other soft tissues: No acute paraspinal abnormality.   Disc levels:   Disc spaces: Disc desiccation at L1-2, L2-3 and L5-S1.   T12-L1: No significant disc bulge. No neural foraminal stenosis. No central canal stenosis.   L1-L2: Minimal broad-based disc bulge. No foraminal or central canal stenosis.   L2-L3: Mild broad-based disc bulge. Mild bilateral facet arthropathy. Mild bilateral foraminal stenosis. No spinal stenosis.   L3-L4: Mild broad-based disc bulge. Mild bilateral facet arthropathy. No foraminal or central canal stenosis.   L4-L5: Broad-based disc bulge. Mild bilateral facet arthropathy. No foraminal or central canal stenosis.   L5-S1: Minimal broad-based disc bulge. Mild bilateral facet arthropathy. No foraminal or central canal stenosis.   IMPRESSION: 1. No acute osseous injury of the lumbar  spine. 2. Chronic L2 vertebral body compression fracture with 50% anterior height loss and prior augmentation. No residual marrow edema. 3. Mild lumbar spine spondylosis as described above.     Electronically Signed   By: Elige Ko M.D.    On: 04/13/2023 06:27  I have personally reviewed the images and agree with the above interpretation.  MRI reviewed with Dr. Myer Haff prior to this visit.   Assessment and Plan: Ms. Cranshaw is a pleasant 52 y.o. female with constant LBP with intermittent left posterior/lateral leg pain to her foot x years. Minimal right leg pain.    History of L2 kyphoplasty.    She has known lumbar spondylosis.   Treatment options discussed with patient and following plan made:   - Dr. Myer Haff does not recommend surgery for her lumbar spine.  - He recommends that she follow up with pain management to discuss further treatment options. She may want to discuss possible EMG and ask about SCS.  - She will f/u with [name] prn.   Drake Leach PA-C Neurosurgery

## 2023-04-21 ENCOUNTER — Other Ambulatory Visit: Payer: Self-pay | Admitting: Psychiatry

## 2023-04-24 ENCOUNTER — Ambulatory Visit (INDEPENDENT_AMBULATORY_CARE_PROVIDER_SITE_OTHER): Payer: Federal, State, Local not specified - PPO | Admitting: Orthopedic Surgery

## 2023-04-24 ENCOUNTER — Encounter: Payer: Self-pay | Admitting: Orthopedic Surgery

## 2023-04-24 DIAGNOSIS — Z9889 Other specified postprocedural states: Secondary | ICD-10-CM

## 2023-04-24 DIAGNOSIS — M47816 Spondylosis without myelopathy or radiculopathy, lumbar region: Secondary | ICD-10-CM

## 2023-04-24 DIAGNOSIS — M4726 Other spondylosis with radiculopathy, lumbar region: Secondary | ICD-10-CM | POA: Diagnosis not present

## 2023-04-24 DIAGNOSIS — M5416 Radiculopathy, lumbar region: Secondary | ICD-10-CM

## 2023-05-17 ENCOUNTER — Other Ambulatory Visit: Payer: Self-pay | Admitting: Student

## 2023-05-17 ENCOUNTER — Encounter: Payer: Self-pay | Admitting: Student

## 2023-05-17 DIAGNOSIS — Z7722 Contact with and (suspected) exposure to environmental tobacco smoke (acute) (chronic): Secondary | ICD-10-CM

## 2023-05-17 DIAGNOSIS — R911 Solitary pulmonary nodule: Secondary | ICD-10-CM
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
## 2023-05-24 ENCOUNTER — Ambulatory Visit
Admission: RE | Admit: 2023-05-24 | Discharge: 2023-05-24 | Disposition: A | Discharge status: Home / Self Care | Payer: Federal, State, Local not specified - PPO | Source: Ambulatory Visit | Attending: Student | Admitting: Student

## 2023-05-24 DIAGNOSIS — Z7722 Contact with and (suspected) exposure to environmental tobacco smoke (acute) (chronic): Secondary | ICD-10-CM | POA: Diagnosis present

## 2023-05-24 DIAGNOSIS — R911 Solitary pulmonary nodule: Secondary | ICD-10-CM | POA: Diagnosis not present

## 2023-05-28 ENCOUNTER — Encounter: Payer: Self-pay | Admitting: Psychiatry

## 2023-05-28 ENCOUNTER — Telehealth (INDEPENDENT_AMBULATORY_CARE_PROVIDER_SITE_OTHER): Payer: Federal, State, Local not specified - PPO | Admitting: Psychiatry

## 2023-05-28 ENCOUNTER — Other Ambulatory Visit: Payer: Self-pay | Admitting: Physician Assistant

## 2023-05-28 DIAGNOSIS — F330 Major depressive disorder, recurrent, mild: Secondary | ICD-10-CM | POA: Diagnosis not present

## 2023-05-28 DIAGNOSIS — F411 Generalized anxiety disorder: Secondary | ICD-10-CM

## 2023-05-28 DIAGNOSIS — F4310 Post-traumatic stress disorder, unspecified: Secondary | ICD-10-CM

## 2023-07-03 ENCOUNTER — Telehealth: Payer: Self-pay

## 2023-07-03 ENCOUNTER — Other Ambulatory Visit: Payer: Self-pay | Admitting: Psychiatry

## 2023-07-03 MED ORDER — DULOXETINE HCL 30 MG PO CPEP: 30.0000 mg | ORAL_CAPSULE | Freq: Every day | ORAL | 3 refills | Status: DC

## 2023-07-03 NOTE — Telephone Encounter (Signed)
Called to make patient aware that her medication had been sent to pharmacy no answer left voicemail for patient to return call to office

## 2023-07-03 NOTE — Telephone Encounter (Signed)
Patient called stating that her Duloxetine was increased from 60 mg to 90 mg she has ran out of the 30 mg that she was taking with the 60 mg and is requesting a refill for the 30 mg she stated that she still have some of the 60 mg please advise   DULoxetine (CYMBALTA) 30 MG capsule   Preferred pharmacy  [id number] - [place], [place] - [address]

## 2023-07-03 NOTE — Telephone Encounter (Signed)
Ordered

## 2023-07-04 ENCOUNTER — Ambulatory Visit: Payer: Federal, State, Local not specified - PPO | Admitting: Psychology

## 2023-07-05 ENCOUNTER — Encounter: Payer: Self-pay | Admitting: Psychology

## 2023-07-05 ENCOUNTER — Ambulatory Visit (INDEPENDENT_AMBULATORY_CARE_PROVIDER_SITE_OTHER): Payer: Federal, State, Local not specified - PPO | Admitting: Psychology

## 2023-07-05 DIAGNOSIS — F411 Generalized anxiety disorder: Secondary | ICD-10-CM | POA: Diagnosis not present

## 2023-07-05 DIAGNOSIS — F331 Major depressive disorder, recurrent, moderate: Secondary | ICD-10-CM | POA: Diagnosis not present
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
## 2023-07-05 NOTE — Progress Notes (Signed)
                STEVEN ALTABET, PhD 
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
## 2023-07-13 ENCOUNTER — Encounter: Payer: Self-pay | Admitting: Psychiatry

## 2023-07-13 ENCOUNTER — Telehealth: Payer: Federal, State, Local not specified - PPO | Admitting: Psychiatry

## 2023-07-13 DIAGNOSIS — F331 Major depressive disorder, recurrent, moderate: Secondary | ICD-10-CM

## 2023-07-13 DIAGNOSIS — F411 Generalized anxiety disorder: Secondary | ICD-10-CM | POA: Diagnosis not present

## 2023-07-13 DIAGNOSIS — F4310 Post-traumatic stress disorder, unspecified: Secondary | ICD-10-CM

## 2023-07-13 MED ORDER — DULOXETINE HCL 60 MG PO CPEP: 60.0000 mg | ORAL_CAPSULE | Freq: Every day | ORAL | 1 refills | Status: DC

## 2023-07-13 MED ORDER — BUPROPION HCL ER (XL) 300 MG PO TB24: 300.0000 mg | ORAL_TABLET | Freq: Every day | ORAL | 0 refills | Status: DC

## 2023-07-13 NOTE — Patient Instructions (Signed)
Increase duloxetine 120 mg daily  Continue bupropion 300 mg daily- Continue buspirone 15 mg twice a day  Next appointment: 10/11 at 8 am

## 2023-07-16 ENCOUNTER — Encounter: Payer: Self-pay | Admitting: Psychiatry

## 2023-07-23 ENCOUNTER — Other Ambulatory Visit: Payer: Self-pay | Admitting: Surgery

## 2023-07-23 DIAGNOSIS — M2242 Chondromalacia patellae, left knee: Secondary | ICD-10-CM

## 2023-07-23 DIAGNOSIS — S83239S Complex tear of medial meniscus, current injury, unspecified knee, sequela: Secondary | ICD-10-CM

## 2023-07-25 ENCOUNTER — Ambulatory Visit
Admission: RE | Admit: 2023-07-25 | Discharge: 2023-07-25 | Disposition: A | Discharge status: Home / Self Care | Payer: Federal, State, Local not specified - PPO | Source: Ambulatory Visit | Attending: Surgery | Admitting: Surgery

## 2023-07-25 DIAGNOSIS — S83239S Complex tear of medial meniscus, current injury, unspecified knee, sequela: Secondary | ICD-10-CM | POA: Diagnosis not present

## 2023-07-25 DIAGNOSIS — M2242 Chondromalacia patellae, left knee: Secondary | ICD-10-CM | POA: Diagnosis present

## 2023-08-13 ENCOUNTER — Encounter: Payer: Self-pay | Admitting: Psychology

## 2023-08-13 ENCOUNTER — Ambulatory Visit (INDEPENDENT_AMBULATORY_CARE_PROVIDER_SITE_OTHER): Payer: Federal, State, Local not specified - PPO | Admitting: Psychology

## 2023-08-13 DIAGNOSIS — F411 Generalized anxiety disorder: Secondary | ICD-10-CM

## 2023-08-13 DIAGNOSIS — F331 Major depressive disorder, recurrent, moderate: Secondary | ICD-10-CM
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
## 2023-08-13 NOTE — Progress Notes (Signed)
                STEVEN ALTABET, PhD 

## 2023-08-14 ENCOUNTER — Encounter: Payer: Self-pay | Admitting: Psychology

## 2023-08-14 ENCOUNTER — Ambulatory Visit (INDEPENDENT_AMBULATORY_CARE_PROVIDER_SITE_OTHER): Payer: Self-pay | Admitting: Psychology

## 2023-08-14 DIAGNOSIS — F331 Major depressive disorder, recurrent, moderate: Secondary | ICD-10-CM

## 2023-08-14 DIAGNOSIS — F411 Generalized anxiety disorder: Secondary | ICD-10-CM
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
## 2023-08-15 ENCOUNTER — Other Ambulatory Visit: Payer: Self-pay | Admitting: Physician Assistant

## 2023-08-15 ENCOUNTER — Encounter: Payer: Self-pay | Admitting: Psychology

## 2023-08-15 DIAGNOSIS — R519 Headache, unspecified: Secondary | ICD-10-CM

## 2023-08-15 DIAGNOSIS — R2689 Other abnormalities of gait and mobility: Secondary | ICD-10-CM

## 2023-08-15 DIAGNOSIS — R4189 Other symptoms and signs involving cognitive functions and awareness: Secondary | ICD-10-CM

## 2023-08-15 DIAGNOSIS — M5481 Occipital neuralgia: Secondary | ICD-10-CM

## 2023-08-15 DIAGNOSIS — R531 Weakness: Secondary | ICD-10-CM

## 2023-08-15 NOTE — Progress Notes (Signed)
Natasha Estrada is a 52 y.o. female patient Report writing competed ( 3 hrs.).  Interactive feedback to be conducted next session. Report to be attached to the feedback progress note.  Patient/Guardian was advised Release of Information must be obtained prior to any record release in order to collaborate their care with an outside provider. Patient/Guardian was advised if they have not already done so to contact the registration department to sign all necessary forms in order for [name] to release information regarding their care.   Consent: Patient/Guardian gives verbal consent for treatment and assignment of benefits for services provided during this visit. Patient/Guardian expressed understanding and agreed to proceed.    Bryson Dames, PhD

## 2023-08-24 ENCOUNTER — Ambulatory Visit (INDEPENDENT_AMBULATORY_CARE_PROVIDER_SITE_OTHER): Payer: Federal, State, Local not specified - PPO | Admitting: Psychology

## 2023-08-24 ENCOUNTER — Encounter: Payer: Self-pay | Admitting: Psychology

## 2023-08-24 DIAGNOSIS — F411 Generalized anxiety disorder: Secondary | ICD-10-CM

## 2023-08-24 DIAGNOSIS — F331 Major depressive disorder, recurrent, moderate: Secondary | ICD-10-CM | POA: Diagnosis not present

## 2023-08-24 DIAGNOSIS — F4389 Other reactions to severe stress: Secondary | ICD-10-CM | POA: Diagnosis not present

## 2023-08-24 DIAGNOSIS — F900 Attention-deficit hyperactivity disorder, predominantly inattentive type: Secondary | ICD-10-CM | POA: Diagnosis not present
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
## 2023-08-24 NOTE — Progress Notes (Signed)
Natasha Dames, PhD
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
## 2023-09-14 ENCOUNTER — Encounter: Payer: Self-pay | Admitting: Psychiatry

## 2023-09-14 ENCOUNTER — Telehealth: Payer: Federal, State, Local not specified - PPO | Admitting: Psychiatry

## 2023-09-14 DIAGNOSIS — F4310 Post-traumatic stress disorder, unspecified: Secondary | ICD-10-CM | POA: Diagnosis not present

## 2023-09-14 DIAGNOSIS — F411 Generalized anxiety disorder: Secondary | ICD-10-CM

## 2023-09-14 DIAGNOSIS — F4010 Social phobia, unspecified: Secondary | ICD-10-CM

## 2023-09-14 DIAGNOSIS — F900 Attention-deficit hyperactivity disorder, predominantly inattentive type: Secondary | ICD-10-CM

## 2023-09-14 DIAGNOSIS — F331 Major depressive disorder, recurrent, moderate: Secondary | ICD-10-CM

## 2023-09-14 MED ORDER — BUPROPION HCL ER (XL) 300 MG PO TB24: 300.0000 mg | ORAL_TABLET | Freq: Every day | ORAL | 0 refills | Status: DC

## 2023-09-14 MED ORDER — DULOXETINE HCL 60 MG PO CPEP: 60.0000 mg | ORAL_CAPSULE | Freq: Every day | ORAL | 1 refills | Status: DC

## 2023-09-14 MED ORDER — ATOMOXETINE HCL 40 MG PO CAPS: 40.0000 mg | ORAL_CAPSULE | Freq: Every day | ORAL | 1 refills | Status: DC

## 2023-09-14 NOTE — Patient Instructions (Signed)
Continue duloxetine 120 mg daily  Continue bupropion 300 mg daily Continue buspirone 15 mg twice a day Next appointment: 11/26 at 10 30 am
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
## 2023-10-30 ENCOUNTER — Encounter: Payer: Self-pay | Admitting: Psychiatry

## 2023-10-30 ENCOUNTER — Ambulatory Visit (INDEPENDENT_AMBULATORY_CARE_PROVIDER_SITE_OTHER): Payer: Federal, State, Local not specified - PPO | Admitting: Psychiatry

## 2023-10-30 VITALS — BP 122/78 | HR 107 | Temp 97.8°F | Ht 64.0 in | Wt 151.0 lb

## 2023-10-30 DIAGNOSIS — F4310 Post-traumatic stress disorder, unspecified: Secondary | ICD-10-CM | POA: Diagnosis not present

## 2023-10-30 DIAGNOSIS — F4010 Social phobia, unspecified: Secondary | ICD-10-CM

## 2023-10-30 DIAGNOSIS — F331 Major depressive disorder, recurrent, moderate: Secondary | ICD-10-CM

## 2023-10-30 DIAGNOSIS — F411 Generalized anxiety disorder: Secondary | ICD-10-CM

## 2023-10-30 DIAGNOSIS — F900 Attention-deficit hyperactivity disorder, predominantly inattentive type: Secondary | ICD-10-CM

## 2023-10-30 NOTE — Patient Instructions (Signed)
Continue duloxetine 120 mg daily  Continue bupropion 300 mg daily Continue buspirone 15 mg twice a day  Change to atomoxetine 40 mg at night  Next appointment: 1/20 at 11 30 am

## 2023-11-11 ENCOUNTER — Other Ambulatory Visit: Payer: Self-pay | Admitting: Psychiatry

## 2023-11-20 ENCOUNTER — Other Ambulatory Visit: Payer: Self-pay | Admitting: Surgery

## 2023-11-20 DIAGNOSIS — M25511 Pain in right shoulder: Secondary | ICD-10-CM
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
## 2023-12-23 ENCOUNTER — Ambulatory Visit: Admission: RE | Admit: 2023-12-23 | Payer: Federal, State, Local not specified - PPO | Source: Ambulatory Visit

## 2023-12-24 ENCOUNTER — Encounter: Payer: Self-pay | Admitting: Psychiatry

## 2023-12-24 ENCOUNTER — Ambulatory Visit: Payer: Federal, State, Local not specified - PPO | Admitting: Psychiatry

## 2023-12-24 VITALS — BP 115/77 | HR 89 | Ht 64.0 in | Wt 153.0 lb

## 2023-12-24 DIAGNOSIS — F4010 Social phobia, unspecified: Secondary | ICD-10-CM | POA: Diagnosis not present

## 2023-12-24 DIAGNOSIS — F4310 Post-traumatic stress disorder, unspecified: Secondary | ICD-10-CM | POA: Diagnosis not present

## 2023-12-24 DIAGNOSIS — F331 Major depressive disorder, recurrent, moderate: Secondary | ICD-10-CM | POA: Diagnosis not present

## 2023-12-24 DIAGNOSIS — Z79899 Other long term (current) drug therapy: Secondary | ICD-10-CM

## 2023-12-24 DIAGNOSIS — F411 Generalized anxiety disorder: Secondary | ICD-10-CM

## 2023-12-24 DIAGNOSIS — F900 Attention-deficit hyperactivity disorder, predominantly inattentive type: Secondary | ICD-10-CM

## 2023-12-24 MED ORDER — BUPROPION HCL ER (XL) 300 MG PO TB24: 300.0000 mg | ORAL_TABLET | Freq: Every day | ORAL | 0 refills | Status: DC

## 2023-12-24 NOTE — Patient Instructions (Signed)
Continue duloxetine 120 mg daily  Continue bupropion 300 mg daily Continue buspirone 15 mg twice a day Obtain UDS Start Concerta 18 mg daily after reviewing UDS Next appointment: 3/18 at 10:30

## 2023-12-25 ENCOUNTER — Ambulatory Visit: Payer: Federal, State, Local not specified - PPO

## 2023-12-26 ENCOUNTER — Ambulatory Visit: Admission: RE | Admit: 2023-12-26 | Payer: Federal, State, Local not specified - PPO | Source: Ambulatory Visit

## 2023-12-26 ENCOUNTER — Ambulatory Visit
Admission: RE | Admit: 2023-12-26 | Discharge: 2023-12-26 | Disposition: A | Discharge status: Home / Self Care | Payer: Federal, State, Local not specified - PPO | Source: Ambulatory Visit | Attending: Surgery | Admitting: Surgery

## 2023-12-26 DIAGNOSIS — M25511 Pain in right shoulder: Secondary | ICD-10-CM | POA: Insufficient documentation

## 2023-12-29 LAB — URINE DRUG PANEL 7
Amphetamines, Urine: NEGATIVE ng/mL
Barbiturate Quant, Ur: NEGATIVE ng/mL
Benzodiazepine Quant, Ur: NEGATIVE ng/mL
Cannabinoid Quant, Ur: NEGATIVE ng/mL
Cocaine (Metab.): NEGATIVE ng/mL
Opiate Quant, Ur: NEGATIVE ng/mL
PCP Quant, Ur: NEGATIVE ng/mL

## 2023-12-30 ENCOUNTER — Other Ambulatory Visit: Payer: Self-pay | Admitting: Psychiatry

## 2023-12-30 MED ORDER — METHYLPHENIDATE HCL ER (OSM) 18 MG PO TBCR: 18.0000 mg | EXTENDED_RELEASE_TABLET | Freq: Every day | ORAL | 0 refills | Status: AC

## 2023-12-30 MED ORDER — METHYLPHENIDATE HCL ER (OSM) 18 MG PO TBCR
18.0000 mg | EXTENDED_RELEASE_TABLET | Freq: Every day | ORAL | 0 refills | Status: DC
Start: 2023-12-30 — End: 2024-01-30

## 2023-12-30 NOTE — Progress Notes (Signed)
Please contact the patient. Urine screen is negative. I have sent Concerta 18 mg daily to the pharmacy as we discussed at her last visit.

## 2023-12-31 NOTE — Progress Notes (Signed)
Called patient as instructed by provider to discuss urine drug screen results and to make aware that her Concerta 18 mg tablets had been sent to her pharmacy patient voiced understanding

## 2024-01-01 ENCOUNTER — Telehealth: Payer: Self-pay

## 2024-01-01 NOTE — Telephone Encounter (Signed)
-----   Message from Chamberino sent at 12/30/2023  9:24 PM EST ----- Please contact the patient. Urine screen is negative. I have sent Concerta 18 mg daily to the pharmacy as we discussed at her last visit.

## 2024-01-01 NOTE — Telephone Encounter (Signed)
i have left 2 message with results and had no call back . closing message.

## 2024-01-15 ENCOUNTER — Ambulatory Visit: Payer: Federal, State, Local not specified - PPO | Admitting: Licensed Clinical Social Worker

## 2024-01-27 ENCOUNTER — Emergency Department: Payer: Federal, State, Local not specified - PPO

## 2024-01-27 ENCOUNTER — Other Ambulatory Visit: Payer: Self-pay

## 2024-01-27 ENCOUNTER — Emergency Department
Admission: EM | Admit: 2024-01-27 | Discharge: 2024-01-27 | Disposition: A | Discharge status: Home / Self Care | Payer: Federal, State, Local not specified - PPO | Source: Home / Self Care | Attending: Emergency Medicine | Admitting: Emergency Medicine

## 2024-01-27 DIAGNOSIS — R079 Chest pain, unspecified: Secondary | ICD-10-CM

## 2024-01-27 DIAGNOSIS — R059 Cough, unspecified: Secondary | ICD-10-CM | POA: Insufficient documentation

## 2024-01-27 DIAGNOSIS — R0789 Other chest pain: Secondary | ICD-10-CM | POA: Insufficient documentation

## 2024-01-27 DIAGNOSIS — R0989 Other specified symptoms and signs involving the circulatory and respiratory systems: Secondary | ICD-10-CM | POA: Insufficient documentation

## 2024-01-27 DIAGNOSIS — K567 Ileus, unspecified: Secondary | ICD-10-CM | POA: Diagnosis not present

## 2024-01-27 DIAGNOSIS — R7989 Other specified abnormal findings of blood chemistry: Secondary | ICD-10-CM | POA: Diagnosis not present

## 2024-01-27 LAB — CBC
HCT: 35.6 % — ABNORMAL LOW (ref 36.0–46.0)
Hemoglobin: 11 g/dL — ABNORMAL LOW (ref 12.0–15.0)
MCH: 28 pg (ref 26.0–34.0)
MCHC: 30.9 g/dL (ref 30.0–36.0)
MCV: 90.6 fL (ref 80.0–100.0)
Platelets: 354 10*3/uL (ref 150–400)
RBC: 3.93 MIL/uL (ref 3.87–5.11)
RDW: 13.4 % (ref 11.5–15.5)
WBC: 5.9 10*3/uL (ref 4.0–10.5)
nRBC: 0 % (ref 0.0–0.2)

## 2024-01-27 LAB — BASIC METABOLIC PANEL
Anion gap: 10 (ref 5–15)
BUN: 18 mg/dL (ref 6–20)
CO2: 20 mmol/L — ABNORMAL LOW (ref 22–32)
Calcium: 8.8 mg/dL — ABNORMAL LOW (ref 8.9–10.3)
Chloride: 109 mmol/L (ref 98–111)
Creatinine, Ser: 0.91 mg/dL (ref 0.44–1.00)
GFR, Estimated: >60 mL/min (ref >60)
Glucose, Bld: 104 mg/dL — ABNORMAL HIGH (ref 70–99)
Potassium: 4.2 mmol/L (ref 3.5–5.1)
Sodium: 139 mmol/L (ref 135–145)

## 2024-01-27 LAB — TROPONIN I (HIGH SENSITIVITY): Troponin I (High Sensitivity): <2 ng/L (ref <18)

## 2024-01-27 MED ORDER — AZITHROMYCIN 250 MG PO TABS: ORAL_TABLET | ORAL | 0 refills | Status: DC

## 2024-01-27 MED ORDER — BENZONATATE 100 MG PO CAPS: 100.0000 mg | ORAL_CAPSULE | Freq: Three times a day (TID) | ORAL | 0 refills | Status: DC | PRN

## 2024-01-27 NOTE — Discharge Instructions (Addendum)
 Please use a decongestant that is safe and high blood pressure including, Coricidin HBP Please use ibuprofen (Motrin) up to 800 mg every 8 hours, naproxen (Naprosyn) up to 500 mg every 12 hours, and/or acetaminophen (Tylenol) up to 4 g/day for any continued pain.  Please do not use this medication regimen for longer than 7 days

## 2024-01-27 NOTE — ED Provider Notes (Signed)
 [facility] Provider Note   Event Date/Time   First MD Initiated Contact with Patient 01/27/24 1549     (approximate) History  Chest Pain  HPI Natasha Estrada is a 53 y.o. female with a past medical history of anxiety/depression, chronic pain syndrome, and recently diagnosed with influenza 2 weeks prior to arrival who presents complaining of persistent cough, rhinorrhea, and right upper chest wall pain that is worse with coughing or taking a deep breath.  Patient states that she feels she has not fully recovered from influenza illness and has been coughing up "green sputum". ROS: Patient currently denies any vision changes, tinnitus, difficulty speaking, facial droop, sore throat, shortness of breath, abdominal pain, nausea/vomiting/diarrhea, dysuria, or weakness/numbness/paresthesias in any extremity   Physical Exam  Triage Vital Signs: ED Triage Vitals  Encounter Vitals Group     BP 01/27/24 1508 118/88     Systolic BP Percentile --      Diastolic BP Percentile --      Pulse Rate 01/27/24 1508 85     Resp 01/27/24 1508 17     Temp 01/27/24 1508 98.1 F (36.7 C)     Temp Source 01/27/24 1508 Oral     SpO2 01/27/24 1508 99 %     Weight --      Height 01/27/24 1459 5\' 4"  (1.626 m)     Head Circumference --      Peak Flow --      Pain Score 01/27/24 1459 6     Pain Loc --      Pain Education --      Exclude from Growth Chart --    Most recent vital signs: Vitals:   01/27/24 1508  BP: 118/88  Pulse: 85  Resp: 17  Temp: 98.1 F (36.7 C)  SpO2: 99%   General: Awake, oriented x4. CV:  Good peripheral perfusion.  Resp:  Normal effort.  Abd:  No distention.  Other:  Middle-aged overweight Caucasian female resting comfortably in no acute distress.  Tenderness to palpation over right lateral and anterior chest wall ED Results / Procedures / Treatments  Labs (all labs ordered are listed, but only abnormal results are displayed) Labs Reviewed  BASIC  METABOLIC PANEL - Abnormal; Notable for the following components:      Result Value   CO2 20 (*)    Glucose, Bld 104 (*)    Calcium 8.8 (*)    All other components within normal limits  CBC - Abnormal; Notable for the following components:   Hemoglobin 11.0 (*)    HCT 35.6 (*)    All other components within normal limits  TROPONIN I (HIGH SENSITIVITY)  TROPONIN I (HIGH SENSITIVITY)   EKG ED ECG REPORT I, Merwyn Katos, the attending physician, personally viewed and interpreted this ECG. Date: 01/27/2024 EKG Time: 1501 Rate: 90 Rhythm: normal sinus rhythm QRS Axis: normal Intervals: normal ST/T Wave abnormalities: normal Narrative Interpretation: no evidence of acute ischemia RADIOLOGY ED MD interpretation: 2 view chest x-ray interpreted by me shows no evidence of acute abnormalities including no pneumonia, pneumothorax, or widened mediastinum -Agree with radiology assessment Official radiology report(s): DG Chest 2 View Result Date: 01/27/2024 CLINICAL DATA:  Productive cough and shortness of breath. Chest pain. EXAM: CHEST - 2 VIEW COMPARISON:  09/15/2022 FINDINGS: The heart size and mediastinal contours are within normal limits. Both lungs are clear. Multiple old right rib fracture deformities again seen. IMPRESSION: No active cardiopulmonary disease. Electronically Signed  By: Danae Orleans M.D.   On: 01/27/2024 15:40   PROCEDURES: Critical Care performed: No Procedures MEDICATIONS ORDERED IN ED: Medications - No data to display IMPRESSION / MDM / ASSESSMENT AND PLAN / ED COURSE  I reviewed the triage vital signs and the nursing notes.                             The patient is on the cardiac monitor to evaluate for evidence of arrhythmia and/or significant heart rate changes. Patient's presentation is most consistent with acute presentation with potential threat to life or bodily function. This patient presents with atypical chest pain, most likely secondary to  musculoskeletal injury. Differential diagnosis includes rib fracture, costochondritis, sternal fracture. Low suspicion for ACS, acute PE (PERC negative), pericarditis / myocarditis, thoracic aortic dissection, pneumothorax, pneumonia or other acute infectious process. Presentation not consistent with other acute, emergent causes of chest pain at this time. No indication for cardiac enzyme testing. Plan to order CXR to evaluate for acute cardiopulmonary causes.  Plan: EKG, CXR, pain control  Dispo: Discharge home with home care   FINAL CLINICAL IMPRESSION(S) / ED DIAGNOSES   Final diagnoses:  Chest pain, unspecified type   Rx / DC Orders   ED Discharge Orders          Ordered    azithromycin (ZITHROMAX Z-PAK) 250 MG tablet        01/27/24 1631    benzonatate (TESSALON PERLES) 100 MG capsule  3 times daily PRN        01/27/24 1637           Note:  This document was prepared using Dragon voice recognition software and may include unintentional dictation errors.   Merwyn Katos, MD 01/27/24 [phone]

## 2024-01-27 NOTE — ED Triage Notes (Signed)
 Pt to ed from [facility] for CP that started yesterday. Pt thinks its from coughing. Pt has had the flu for the last 2 weeks. She was diagnosed on 2/8 and has had a bad cough since then and is now coughing up green mucus. Pt is caox4, in no acute distress and ambulatory in triage. Pt states "I have nodules on my lungs and wonder if the coughing is making it worse".

## 2024-01-28 ENCOUNTER — Other Ambulatory Visit: Payer: Self-pay

## 2024-01-28 ENCOUNTER — Inpatient Hospital Stay: Payer: Medicare Other

## 2024-01-28 ENCOUNTER — Emergency Department: Payer: Medicare Other

## 2024-01-28 ENCOUNTER — Ambulatory Visit: Payer: Self-pay

## 2024-01-28 ENCOUNTER — Inpatient Hospital Stay
Admission: EM | Admit: 2024-01-28 | Discharge: 2024-01-30 | DRG: 948 | Disposition: A | Discharge status: Home / Self Care | Payer: Medicare Other | Attending: Obstetrics and Gynecology | Admitting: Obstetrics and Gynecology

## 2024-01-28 DIAGNOSIS — Z882 Allergy status to sulfonamides status: Secondary | ICD-10-CM | POA: Diagnosis not present

## 2024-01-28 DIAGNOSIS — B179 Acute viral hepatitis, unspecified: Secondary | ICD-10-CM | POA: Diagnosis not present

## 2024-01-28 DIAGNOSIS — R748 Abnormal levels of other serum enzymes: Secondary | ICD-10-CM | POA: Diagnosis present

## 2024-01-28 DIAGNOSIS — Z9884 Bariatric surgery status: Secondary | ICD-10-CM | POA: Diagnosis not present

## 2024-01-28 DIAGNOSIS — Z9049 Acquired absence of other specified parts of digestive tract: Secondary | ICD-10-CM | POA: Diagnosis not present

## 2024-01-28 DIAGNOSIS — R197 Diarrhea, unspecified: Secondary | ICD-10-CM | POA: Diagnosis not present

## 2024-01-28 DIAGNOSIS — G894 Chronic pain syndrome: Secondary | ICD-10-CM | POA: Diagnosis present

## 2024-01-28 DIAGNOSIS — G4730 Sleep apnea, unspecified: Secondary | ICD-10-CM | POA: Diagnosis present

## 2024-01-28 DIAGNOSIS — R1011 Right upper quadrant pain: Secondary | ICD-10-CM | POA: Diagnosis not present

## 2024-01-28 DIAGNOSIS — R112 Nausea with vomiting, unspecified: Secondary | ICD-10-CM | POA: Diagnosis not present

## 2024-01-28 DIAGNOSIS — I10 Essential (primary) hypertension: Secondary | ICD-10-CM | POA: Diagnosis present

## 2024-01-28 DIAGNOSIS — Z1152 Encounter for screening for COVID-19: Secondary | ICD-10-CM | POA: Diagnosis not present

## 2024-01-28 DIAGNOSIS — Z79899 Other long term (current) drug therapy: Secondary | ICD-10-CM | POA: Diagnosis not present

## 2024-01-28 DIAGNOSIS — R7989 Other specified abnormal findings of blood chemistry: Principal | ICD-10-CM | POA: Diagnosis present

## 2024-01-28 DIAGNOSIS — F418 Other specified anxiety disorders: Secondary | ICD-10-CM | POA: Diagnosis not present

## 2024-01-28 DIAGNOSIS — F419 Anxiety disorder, unspecified: Secondary | ICD-10-CM | POA: Diagnosis present

## 2024-01-28 DIAGNOSIS — R7401 Elevation of levels of liver transaminase levels: Principal | ICD-10-CM

## 2024-01-28 DIAGNOSIS — K589 Irritable bowel syndrome without diarrhea: Secondary | ICD-10-CM | POA: Diagnosis present

## 2024-01-28 DIAGNOSIS — Z7983 Long term (current) use of bisphosphonates: Secondary | ICD-10-CM | POA: Diagnosis not present

## 2024-01-28 DIAGNOSIS — R109 Unspecified abdominal pain: Secondary | ICD-10-CM | POA: Diagnosis not present

## 2024-01-28 DIAGNOSIS — E869 Volume depletion, unspecified: Secondary | ICD-10-CM | POA: Diagnosis present

## 2024-01-28 DIAGNOSIS — K838 Other specified diseases of biliary tract: Secondary | ICD-10-CM | POA: Diagnosis not present

## 2024-01-28 DIAGNOSIS — K567 Ileus, unspecified: Secondary | ICD-10-CM | POA: Diagnosis present

## 2024-01-28 DIAGNOSIS — K219 Gastro-esophageal reflux disease without esophagitis: Secondary | ICD-10-CM | POA: Diagnosis present

## 2024-01-28 DIAGNOSIS — E785 Hyperlipidemia, unspecified: Secondary | ICD-10-CM | POA: Diagnosis present

## 2024-01-28 DIAGNOSIS — Z8249 Family history of ischemic heart disease and other diseases of the circulatory system: Secondary | ICD-10-CM | POA: Diagnosis not present

## 2024-01-28 DIAGNOSIS — F32A Depression, unspecified: Secondary | ICD-10-CM | POA: Diagnosis present

## 2024-01-28 DIAGNOSIS — G4700 Insomnia, unspecified: Secondary | ICD-10-CM | POA: Diagnosis present

## 2024-01-28 DIAGNOSIS — Z888 Allergy status to other drugs, medicaments and biological substances status: Secondary | ICD-10-CM

## 2024-01-28 LAB — COMPREHENSIVE METABOLIC PANEL
ALT: 155 U/L — ABNORMAL HIGH (ref 0–44)
AST: 349 U/L — ABNORMAL HIGH (ref 15–41)
Albumin: 3.7 g/dL (ref 3.5–5.0)
Alkaline Phosphatase: 105 U/L (ref 38–126)
Anion gap: 11 (ref 5–15)
BUN: 27 mg/dL — ABNORMAL HIGH (ref 6–20)
CO2: 22 mmol/L (ref 22–32)
Calcium: 8.8 mg/dL — ABNORMAL LOW (ref 8.9–10.3)
Chloride: 106 mmol/L (ref 98–111)
Creatinine, Ser: 0.99 mg/dL (ref 0.44–1.00)
GFR, Estimated: >60 mL/min (ref >60)
Glucose, Bld: 140 mg/dL — ABNORMAL HIGH (ref 70–99)
Potassium: 4.3 mmol/L (ref 3.5–5.1)
Sodium: 139 mmol/L (ref 135–145)
Total Bilirubin: 1.6 mg/dL — ABNORMAL HIGH (ref 0.0–1.2)
Total Protein: 6.6 g/dL (ref 6.5–8.1)

## 2024-01-28 LAB — ACETAMINOPHEN LEVEL: Acetaminophen (Tylenol), Serum: <10 ug/mL — ABNORMAL LOW (ref 10–30)

## 2024-01-28 LAB — CBC WITH DIFFERENTIAL/PLATELET
Abs Immature Granulocytes: 0.04 10*3/uL (ref 0.00–0.07)
Basophils Absolute: 0 10*3/uL (ref 0.0–0.1)
Basophils Relative: 0 %
Eosinophils Absolute: 0 10*3/uL (ref 0.0–0.5)
Eosinophils Relative: 0 %
HCT: 36.8 % (ref 36.0–46.0)
Hemoglobin: 11.9 g/dL — ABNORMAL LOW (ref 12.0–15.0)
Immature Granulocytes: 0 %
Lymphocytes Relative: 2 %
Lymphs Abs: 0.2 10*3/uL — ABNORMAL LOW (ref 0.7–4.0)
MCH: 27.7 pg (ref 26.0–34.0)
MCHC: 32.3 g/dL (ref 30.0–36.0)
MCV: 85.6 fL (ref 80.0–100.0)
Monocytes Absolute: 0.3 10*3/uL (ref 0.1–1.0)
Monocytes Relative: 3 %
Neutro Abs: 10.2 10*3/uL — ABNORMAL HIGH (ref 1.7–7.7)
Neutrophils Relative %: 95 %
Platelets: 413 10*3/uL — ABNORMAL HIGH (ref 150–400)
RBC: 4.3 MIL/uL (ref 3.87–5.11)
RDW: 13.3 % (ref 11.5–15.5)
WBC: 10.7 10*3/uL — ABNORMAL HIGH (ref 4.0–10.5)
nRBC: 0 % (ref 0.0–0.2)

## 2024-01-28 LAB — URINALYSIS, ROUTINE W REFLEX MICROSCOPIC
Bacteria, UA: NONE SEEN
Bilirubin Urine: NEGATIVE
Glucose, UA: NEGATIVE mg/dL
Hgb urine dipstick: NEGATIVE
Ketones, ur: NEGATIVE mg/dL
Nitrite: NEGATIVE
Protein, ur: NEGATIVE mg/dL
Specific Gravity, Urine: 1.039 — ABNORMAL HIGH (ref 1.005–1.030)
pH: 5 (ref 5.0–8.0)

## 2024-01-28 LAB — RESP PANEL BY RT-PCR (RSV, FLU A&B, COVID)  RVPGX2
Influenza A by PCR: NEGATIVE
Influenza B by PCR: NEGATIVE
Resp Syncytial Virus by PCR: NEGATIVE
SARS Coronavirus 2 by RT PCR: NEGATIVE

## 2024-01-28 LAB — PROTIME-INR
INR: 1.1 (ref 0.8–1.2)
Prothrombin Time: 14.1 s (ref 11.4–15.2)

## 2024-01-28 LAB — APTT: aPTT: 26 s (ref 24–36)

## 2024-01-28 LAB — LIPASE, BLOOD: Lipase: 32 U/L (ref 11–51)

## 2024-01-28 MED ORDER — HYDRALAZINE HCL 20 MG/ML IJ SOLN: 5.0000 mg | INTRAMUSCULAR | Status: DC | PRN

## 2024-01-28 MED ORDER — IBUPROFEN 400 MG PO TABS
400.0000 mg | ORAL_TABLET | Freq: Four times a day (QID) | ORAL | Status: DC | PRN
  Administered 2024-01-29 (×2): 400 mg via ORAL
  Filled 2024-01-28 (×2): qty 1

## 2024-01-28 MED ORDER — SODIUM CHLORIDE 0.9 % IV SOLN: INTRAVENOUS | Status: AC

## 2024-01-28 MED ORDER — HYDROXYZINE HCL 25 MG PO TABS
25.0000 mg | ORAL_TABLET | Freq: Three times a day (TID) | ORAL | Status: DC
  Administered 2024-01-28 – 2024-01-30 (×5): 25 mg via ORAL
  Filled 2024-01-28 (×7): qty 1

## 2024-01-28 MED ORDER — MORPHINE SULFATE (PF) 4 MG/ML IV SOLN
4.0000 mg | Freq: Once | INTRAVENOUS | Status: AC
  Administered 2024-01-28: 4 mg via INTRAVENOUS
  Filled 2024-01-28: qty 1

## 2024-01-28 MED ORDER — OXYCODONE HCL 5 MG PO TABS
5.0000 mg | ORAL_TABLET | Freq: Four times a day (QID) | ORAL | Status: DC | PRN
  Administered 2024-01-30: 5 mg via ORAL
  Filled 2024-01-28: qty 1

## 2024-01-28 MED ORDER — DM-GUAIFENESIN ER 30-600 MG PO TB12
1.0000 | ORAL_TABLET | Freq: Two times a day (BID) | ORAL | Status: DC | PRN
Start: 2024-01-28 — End: 2024-01-30

## 2024-01-28 MED ORDER — METHYLPREDNISOLONE SODIUM SUCC 125 MG IJ SOLR: 80.0000 mg | INTRAMUSCULAR | Status: DC

## 2024-01-28 MED ORDER — IOHEXOL 300 MG/ML  SOLN
100.0000 mL | Freq: Once | INTRAMUSCULAR | Status: AC | PRN
  Administered 2024-01-28: 100 mL via INTRAVENOUS

## 2024-01-28 MED ORDER — GADOBUTROL 1 MMOL/ML IV SOLN
7.0000 mL | Freq: Once | INTRAVENOUS | Status: AC | PRN
  Administered 2024-01-28: 7 mL via INTRAVENOUS

## 2024-01-28 MED ORDER — SODIUM CHLORIDE 0.9 % IV BOLUS
1000.0000 mL | Freq: Once | INTRAVENOUS | Status: AC
  Administered 2024-01-28: 1000 mL via INTRAVENOUS

## 2024-01-28 MED ORDER — ONDANSETRON HCL 4 MG/2ML IJ SOLN
4.0000 mg | Freq: Three times a day (TID) | INTRAMUSCULAR | Status: DC | PRN
  Administered 2024-01-28: 4 mg via INTRAVENOUS
  Filled 2024-01-28: qty 2

## 2024-01-28 MED ORDER — BUSPIRONE HCL 10 MG PO TABS
15.0000 mg | ORAL_TABLET | Freq: Two times a day (BID) | ORAL | Status: DC
  Administered 2024-01-28 – 2024-01-30 (×4): 15 mg via ORAL
  Filled 2024-01-28 (×4): qty 2

## 2024-01-28 MED ORDER — ZOLPIDEM TARTRATE 5 MG PO TABS: 5.0000 mg | ORAL_TABLET | Freq: Every evening | ORAL | Status: DC | PRN

## 2024-01-28 MED ORDER — PIPERACILLIN-TAZOBACTAM 3.375 G IVPB
3.3750 g | Freq: Three times a day (TID) | INTRAVENOUS | Status: DC
  Administered 2024-01-28 – 2024-01-29 (×2): 3.375 g via INTRAVENOUS
  Filled 2024-01-28 (×3): qty 50

## 2024-01-28 MED ORDER — ALBUTEROL SULFATE (2.5 MG/3ML) 0.083% IN NEBU: 2.0000 mg | INHALATION_SOLUTION | RESPIRATORY_TRACT | Status: DC | PRN

## 2024-01-28 MED ORDER — GABAPENTIN 300 MG PO CAPS
600.0000 mg | ORAL_CAPSULE | Freq: Every day | ORAL | Status: DC
  Administered 2024-01-29 – 2024-01-30 (×2): 600 mg via ORAL
  Filled 2024-01-28 (×2): qty 2

## 2024-01-28 MED ORDER — HEPARIN SODIUM (PORCINE) 5000 UNIT/ML IJ SOLN
5000.0000 [IU] | Freq: Three times a day (TID) | INTRAMUSCULAR | Status: DC
  Administered 2024-01-28 – 2024-01-30 (×6): 5000 [IU] via SUBCUTANEOUS
  Filled 2024-01-28 (×6): qty 1

## 2024-01-28 MED ORDER — ONDANSETRON HCL 4 MG/2ML IJ SOLN
4.0000 mg | Freq: Once | INTRAMUSCULAR | Status: AC
  Administered 2024-01-28: 4 mg via INTRAVENOUS
  Filled 2024-01-28: qty 2

## 2024-01-28 MED ORDER — MORPHINE SULFATE (PF) 2 MG/ML IV SOLN: 2.0000 mg | INTRAVENOUS | Status: DC | PRN

## 2024-01-28 NOTE — ED Provider Notes (Signed)
 [facility] Provider Note    Event Date/Time   First MD Initiated Contact with Patient 01/28/24 1513     (approximate)   History   Flu +   HPI  Natasha Estrada is a 53 y.o. female with PMH of hypertension, arthritis, IBS, chronic pain syndrome, anxiety, GERD presents for evaluation of vomiting.  Patient tested positive for the flu at the beginning of the month and states that she has never gotten better.  She developed some chest pain on Saturday that worsened on Sunday bring her into the emergency department which has resolved at this time.  She had a negative cardiac workup yesterday.  She is back today because she still feels sick and began vomiting multiple times this morning.  She endorses cough, congestion, headache, nausea, vomiting and fevers.     Physical Exam   Triage Vital Signs: ED Triage Vitals  Encounter Vitals Group     BP 01/28/24 1359 97/67     Systolic BP Percentile --      Diastolic BP Percentile --      Pulse Rate 01/28/24 1359 (!) 114     Resp 01/28/24 1359 17     Temp 01/28/24 1359 100 F (37.8 C)     Temp Source 01/28/24 1359 Oral     SpO2 01/28/24 1359 96 %     Weight 01/28/24 1402 150 lb (68 kg)     Height 01/28/24 1402 5\' 4"  (1.626 m)     Head Circumference --      Peak Flow --      Pain Score 01/28/24 1402 10     Pain Loc --      Pain Education --      Exclude from Growth Chart --     Most recent vital signs: Vitals:   01/28/24 1359 01/28/24 1648  BP: 97/67 105/70  Pulse: (!) 114 99  Resp: 17 16  Temp: 100 F (37.8 C) 98.8 F (37.1 C)  SpO2: 96% 96%    General: Awake, uncomfortable appearing. CV:  Good peripheral perfusion.  RRR. Resp:  Normal effort.  CTAB. Abd:  No distention.  Soft, generalized tenderness to palpation throughout the abdomen.   ED Results / Procedures / Treatments   Labs (all labs ordered are listed, but only abnormal results are displayed) Labs Reviewed  CBC WITH  DIFFERENTIAL/PLATELET - Abnormal; Notable for the following components:      Result Value   WBC 10.7 (*)    Hemoglobin 11.9 (*)    Platelets 413 (*)    Neutro Abs 10.2 (*)    Lymphs Abs 0.2 (*)    All other components within normal limits  COMPREHENSIVE METABOLIC PANEL - Abnormal; Notable for the following components:   Glucose, Bld 140 (*)    BUN 27 (*)    Calcium 8.8 (*)    AST 349 (*)    ALT 155 (*)    Total Bilirubin 1.6 (*)    All other components within normal limits  URINALYSIS, ROUTINE W REFLEX MICROSCOPIC - Abnormal; Notable for the following components:   Color, Urine YELLOW (*)    APPearance CLEAR (*)    Specific Gravity, Urine 1.039 (*)    Leukocytes,Ua TRACE (*)    All other components within normal limits  RESP PANEL BY RT-PCR (RSV, FLU A&B, COVID)  [id number]  CULTURE, BLOOD (ROUTINE X 2)  CULTURE, BLOOD (ROUTINE X 2)  LIPASE, BLOOD  HEPATITIS PANEL, ACUTE  HIV ANTIBODY (  ROUTINE TESTING W REFLEX)  PROTIME-INR  APTT  COMPREHENSIVE METABOLIC PANEL  CBC  ACETAMINOPHEN LEVEL    RADIOLOGY  CT abdomen pelvis obtained, interpreted the images as well as reviewed the radiologist report.  Patient has postoperative changes from gastric bypass and cholecystectomy.  Taking their is some dilated small bowel along the site of surgical anastomosis which may be a focal ileus.  There is also some mild central intrahepatic biliary dilation and dilation of the common bile duct.  Furthermore she has a chronic compression fracture deformity and subsequent vertebroplasty at L2.   PROCEDURES:  Critical Care performed: No  Procedures   MEDICATIONS ORDERED IN ED: Medications  ondansetron (ZOFRAN) injection 4 mg (4 mg Intravenous Given 01/28/24 1852)  hydrALAZINE (APRESOLINE) injection 5 mg (has no administration in time range)  ibuprofen (ADVIL) tablet 400 mg (has no administration in time range)  zolpidem (AMBIEN) tablet 5 mg (has no administration in time range)  albuterol  (PROVENTIL) (2.5 MG/3ML) 0.083% nebulizer solution 2 mg (has no administration in time range)  dextromethorphan-guaiFENesin (MUCINEX DM) 30-600 MG per 12 hr tablet 1 tablet (has no administration in time range)  heparin injection 5,000 Units (has no administration in time range)  piperacillin-tazobactam (ZOSYN) IVPB 3.375 g (has no administration in time range)  morphine (PF) 4 MG/ML injection 4 mg (has no administration in time range)  sodium chloride 0.9 % bolus 1,000 mL (1,000 mLs Intravenous New Bag/Given 01/28/24 1540)  ondansetron (ZOFRAN) injection 4 mg (4 mg Intravenous Given 01/28/24 1540)  morphine (PF) 4 MG/ML injection 4 mg (4 mg Intravenous Given 01/28/24 1540)  iohexol (OMNIPAQUE) 300 MG/ML solution 100 mL (100 mLs Intravenous Contrast Given 01/28/24 1607)     IMPRESSION / MDM / ASSESSMENT AND PLAN / ED COURSE  I reviewed the triage vital signs and the nursing notes.                             53 year old female presents for evaluation generalized abdominal pain and vomiting that began this morning.  Patient was tachycardic upon initial presentation otherwise vital signs are stable.  Differential diagnosis includes, but is not limited to, ovarian cyst, ovarian torsion, acute appendicitis, diverticulitis, urinary tract infection/pyelonephritis, endometriosis, bowel obstruction, colitis, renal colic, gastroenteritis, hernia, fibroids, endometriosis, pregnancy related pain including ectopic pregnancy, etc.  Patient's presentation is most consistent with acute complicated illness / injury requiring diagnostic workup.  Respiratory panel negative.  CBC shows leukocytosis with neutrophilia.  Hemoglobin is a little decreased but this is close to patient's baseline.  CMP notable for elevated BUN, AST and ALT as well as total bilirubin.  Lipase WNL.  Urinalysis shows trace leukocytes but is otherwise normal.  CT scan shows what appears to be a focal ileus in the small intestine as well  as some mild central intrahepatic biliary dilation and dilation of the common bile duct.  Patient was given IV fluids, nausea medication and pain medication while in the ED.  I believe patient is most appropriate for inpatient admission for observation of transaminitis and ileus.  I spoke with the hospitalist who was in agreement of plan.  Spoke with the patient who is also agreeable for admission. Patient was stable at the time of admission.   FINAL CLINICAL IMPRESSION(S) / ED DIAGNOSES   Final diagnoses:  Transaminitis  Ileus (HCC)     Rx / DC Orders   ED Discharge Orders     None  Note:  This document was prepared using Dragon voice recognition software and may include unintentional dictation errors.   Cameron Ali, PA-C 01/28/24 Blain Pais, MD 01/28/24 [phone]

## 2024-01-28 NOTE — H&P (Signed)
 History and Physical    Natasha Estrada QMV:[id number] DOB: 08-Dec-1970 DOA: 01/28/2024  Referring MD/NP/PA:   PCP: Carren Rang, PA-C   Patient coming from:  The patient is coming from home.     Chief Complaint: Abdominal pain, nausea, vomiting, diarrhea, cough  HPI: Natasha Estrada is a 53 y.o. female with medical history significant of hypertension, hyperlipidemia, GERD, depression with anxiety, kidney stone, syrinx of spinal cord, UTI, IBS, chronic pain, insomnia, s/p of Roux-en-Y bypass, s/p of cholecystectomy, recent flu A, who presents with abdominal pain, nausea, vomiting, diarrhea, cough.  Patient states that she was diagnosed with the flu 2 weeks ago, but has not recovered well.  She continues to have cough with greenish colored sputum production and mild SOB.  Patient had some chest discomfort earlier, which has resolved.  Currently no active chest pain.  States that since this morning, she developed nausea, vomiting, diarrhea and abdominal pain.  She has had multiple episodes of nonbilious nonbloody vomiting.  She has had few episodes of watery diarrhea.  Her abdominal pain is located in the middle abdomen, which is constant, aching, nonradiating, not aggravated or alleviated by any known factors.  Denies symptoms of UTI.  Patient has fever and chills.  Data reviewed independently and ED Course: pt was found to have WBC 10.7, GFR> 60, abnormal liver function (ALP 105, AST 349, ALT 155, total bilirubin 1.6), troponin <2.0, negative PCR for COVID, flu and RSV.  Chest x-ray negative.  Patient is admitted to telemetry bed as inpatient.  CT-abdomen/pelvis 1. Postoperative changes within the gastric region and mid left abdomen, as described above. 2. Dilated small bowel along the site of surgical anastomosis, which may represent a focal ileus. 3. Evidence of prior cholecystectomy with mild central intrahepatic biliary dilatation and dilatation of the common bile duct. 4. Chronic  compression fracture deformity and subsequent vertebroplasty at the level of L2.    EKG: I have personally reviewed.  Sinus rhythm, QTc 428, low voltage, nonspecific T wave change.   Review of Systems:   General: has fevers, chills, no body weight gain, has poor appetite, has fatigue HEENT: no blurry vision, hearing changes or sore throat Respiratory: has dyspnea, coughing, no wheezing CV: no chest pain, no palpitations GI: has nausea, vomiting, abdominal pain, diarrhea, no constipation GU: no dysuria, burning on urination, increased urinary frequency, hematuria  Ext: no leg edema Neuro: no unilateral weakness, numbness, or tingling, no vision change or hearing loss Skin: no rash, no skin tear. MSK: No muscle spasm, no deformity, no limitation of range of movement in spin Heme: No easy bruising.  Travel history: No recent long distant travel.   Allergy:  Allergies  Allergen Reactions   Cefuroxime Shortness Of Breath    1.) Tolerated 1st generation cephalosporin (CEPHALEXIN) courses on 10/17/2021 and 11/11/2021 without documented ADRs. 2.) Tolerates Augmentin per husband Joe   Sulfa Antibiotics Rash    Past Medical History:  Diagnosis Date   Anxiety    Arthritis    Chronic low back pain with left-sided sciatica    Chronic pain syndrome    Depression    Fibroid    GERD (gastroesophageal reflux disease)    Heart murmur    Echo 09/14/22   History of kidney stones    Hypertension    IBS (irritable bowel syndrome)    with constipation   Insomnia    Kidney lesion, native, left 09/2020   following renal mass protocol on 10/14/20   Kidney stone  Lumbar radiculopathy    Radiculitis, cervical    c7-c8   Sleep apnea    can't wear CPAP - anxiety   Syrinx of spinal cord (HCC) 2021   Urinary tract infection 10/2020   recurrent. on daily antibix treatment    Past Surgical History:  Procedure Laterality Date   BLADDER STONE REMOVAL  2018   CERVICAL CONE BIOPSY  2018    CHOLECYSTECTOMY     EXTRACORPOREAL SHOCK WAVE LITHOTRIPSY Left 08/17/2022   Procedure: EXTRACORPOREAL SHOCK WAVE LITHOTRIPSY (ESWL);  Surgeon: Riki Altes, MD;  Location: [facility];  Service: Urology;  Laterality: Left;   EXTRACORPOREAL SHOCK WAVE LITHOTRIPSY Left 09/07/2022   Procedure: EXTRACORPOREAL SHOCK WAVE LITHOTRIPSY (ESWL);  Surgeon: Riki Altes, MD;  Location: [facility];  Service: Urology;  Laterality: Left;   GASTRIC ROUX-EN-Y  2013   KYPHOPLASTY N/A 12/07/2020   Procedure: L2 Kyphoplasty;  Surgeon: Kennedy Bucker, MD;  Location: [facility];  Service: Orthopedics;  Laterality: N/A;   OPEN REDUCTION INTERNAL FIXATION (ORIF) DISTAL RADIAL FRACTURE Left 09/22/2022   Procedure: OPEN REDUCTION INTERNAL FIXATION (ORIF) DISTAL RADIUS FRACTURE;  Surgeon: Kennedy Bucker, MD;  Location: [facility];  Service: Orthopedics;  Laterality: Left;  sleep apnea   ROTATOR CUFF REPAIR Left 2010   SHOULDER ARTHROSCOPY WITH SUBACROMIAL DECOMPRESSION AND OPEN ROTATOR C Left 10/19/2020   Procedure: LEFT SHOULDER ARTHROSCOPY WITH DEBRIDEMENT DECOMPRESSION AND POSSIBLE BICEP TENODESIS;  Surgeon: Christena Flake, MD;  Location: [facility];  Service: Orthopedics;  Laterality: Left;   SHOULDER ARTHROSCOPY WITH SUBACROMIAL DECOMPRESSION AND OPEN ROTATOR C Left 12/08/2021   Procedure: Left shoulder arthroscopy with debridement, decompression, and rotator cuff repair;  Surgeon: Christena Flake, MD;  Location: [facility];  Service: Orthopedics;  Laterality: Left;   TONSILLECTOMY     as a child   transforaminal epidural steroid injection Left 10/01/2020   C6-7    Social History:  reports that she has never smoked. She has never used smokeless tobacco. She reports that she does not currently use alcohol. She reports that she does not use drugs.  Family History:  Family History  Problem Relation Age of Onset   Hypertension Mother      Prior to Admission medications   Medication Sig Start Date End Date  Taking? Authorizing Provider  acetaminophen (TYLENOL) 325 MG tablet Take 2 tablets (650 mg total) by mouth every 6 (six) hours as needed for mild pain (or Fever >/= 101). 10/17/21   Leeroy Bock, MD  alendronate (FOSAMAX) 70 MG tablet Take 70 mg by mouth once a week. 05/15/22   [provider]  atorvastatin (LIPITOR) 20 MG tablet Take 20 mg by mouth daily. 05/15/22   [provider]  azithromycin (ZITHROMAX Z-PAK) 250 MG tablet Take 2 tablets (500 mg) on  Day 1,  followed by 1 tablet (250 mg) once daily on Days 2 through 5. 01/27/24 02/01/24  Bradler, Clent Jacks, MD  benzonatate (TESSALON PERLES) 100 MG capsule Take 1 capsule (100 mg total) by mouth 3 (three) times daily as needed for cough. 01/27/24 01/26/25  Merwyn Katos, MD  buPROPion (WELLBUTRIN XL) 300 MG 24 hr tablet Take 1 tablet (300 mg total) by mouth daily. 01/09/24 04/08/24  Neysa Hotter, MD  busPIRone (BUSPAR) 15 MG tablet Take 15 mg by mouth 2 (two) times daily. 10/13/21   [provider]  Cholecalciferol (VITAMIN D3 PO) Take by mouth daily.    [provider]  DULoxetine (CYMBALTA) 60 MG capsule  Take 1 capsule (60 mg total) by mouth daily. 09/14/23 03/12/24  Neysa Hotter, MD  EPINEPHrine 0.3 mg/0.3 mL IJ SOAJ injection Inject 0.3 mg into the muscle once as needed (if patient exhibits significant signs and symptoms of allergic reaction.). 10/17/21   Leeroy Bock, MD  gabapentin (NEURONTIN) 600 MG tablet Take 600 mg by mouth daily.    [provider]  hydrOXYzine (VISTARIL) 25 MG capsule Take 25 mg by mouth 3 (three) times daily. 07/11/22   [provider]  losartan (COZAAR) 50 MG tablet Take 50 mg by mouth daily. 04/20/21   [provider]  methylphenidate (CONCERTA) 18 MG PO CR tablet Take 1 tablet (18 mg total) by mouth daily before breakfast. 12/30/23 01/29/24  Neysa Hotter, MD  methylphenidate (CONCERTA) 18 MG PO CR tablet Take 1 tablet (18 mg total) by mouth daily before  breakfast. 01/29/24 02/28/24  Neysa Hotter, MD  omeprazole (PRILOSEC) 20 MG capsule Take 20 mg by mouth daily. 11/14/21   [provider]  tiZANidine (ZANAFLEX) 4 MG tablet Take 4 mg by mouth 3 (three) times daily. 08/02/22   [provider]  trimethoprim (TRIMPEX) 100 MG tablet TAKE 1 TABLET(100 MG) BY MOUTH DAILY 05/29/23   Vaillancourt, Lelon Mast, PA-C  VITAMIN A PO Take by mouth daily.    [provider]  VITAMIN E PO Take by mouth daily.    [provider]  zolpidem (AMBIEN) 10 MG tablet Take 10 mg by mouth daily. 07/14/22   [provider]    Physical Exam: Vitals:   01/28/24 1359 01/28/24 1402 01/28/24 1648 01/28/24 2036  BP: 97/67  105/70 (!) 90/58  Pulse: (!) 114  99 (!) 105  Resp: 17  16   Temp: 100 F (37.8 C)  98.8 F (37.1 C) 100.3 F (37.9 C)  TempSrc: Oral  Oral   SpO2: 96%  96% 95%  Weight:  68 kg    Height:  5\' 4"  (1.626 m)     General: Not in acute distress HEENT:       Eyes: PERRL, EOMI, no jaundice       ENT: No discharge from the ears and nose, no pharynx injection, no tonsillar enlargement.        Neck: No JVD, no bruit, no mass felt. Heme: No neck lymph node enlargement. Cardiac: S1/S2, RRR, No gallops or rubs. Respiratory: No rales, wheezing, rhonchi or rubs. GI: Soft, nondistended,  has tenderness in upper and middle abdomen, no rebound pain, no organomegaly, BS present. GU: No hematuria Ext: No pitting leg edema bilaterally. 1+DP/PT pulse bilaterally. Musculoskeletal: No joint deformities, No joint redness or warmth, no limitation of ROM in spin. Skin: No rashes.  Neuro: Alert, oriented X3, cranial nerves II-XII grossly intact, moves all extremities normally.  Psych: Patient is not psychotic, no suicidal or hemocidal ideation.  Labs on Admission: I have personally reviewed following labs and imaging studies  CBC: Recent Labs  Lab 01/27/24 1501 01/28/24 1405  WBC 5.9 10.7*  NEUTROABS  --  10.2*  HGB  11.0* 11.9*  HCT 35.6* 36.8  MCV 90.6 85.6  PLT 354 413*   Basic Metabolic Panel: Recent Labs  Lab 01/27/24 1501 01/28/24 1405  NA 139 139  K 4.2 4.3  CL 109 106  CO2 20* 22  GLUCOSE 104* 140*  BUN 18 27*  CREATININE 0.91 0.99  CALCIUM 8.8* 8.8*   GFR: Estimated Creatinine Clearance: 61.5 mL/min (by C-G formula based on SCr of 0.99 mg/dL).  Liver Function Tests: Recent Labs  Lab 01/28/24 1405  AST 349*  ALT 155*  ALKPHOS 105  BILITOT 1.6*  PROT 6.6  ALBUMIN 3.7   Recent Labs  Lab 01/28/24 1405  LIPASE 32   No results for input(s): "AMMONIA" in the last 168 hours. Coagulation Profile: No results for input(s): "INR", "PROTIME" in the last 168 hours. Cardiac Enzymes: No results for input(s): "CKTOTAL", "CKMB", "CKMBINDEX", "TROPONINI" in the last 168 hours. BNP (last 3 results) No results for input(s): "PROBNP" in the last 8760 hours. HbA1C: No results for input(s): "HGBA1C" in the last 72 hours. CBG: No results for input(s): "GLUCAP" in the last 168 hours. Lipid Profile: No results for input(s): "CHOL", "HDL", "LDLCALC", "TRIG", "CHOLHDL", "LDLDIRECT" in the last 72 hours. Thyroid Function Tests: No results for input(s): "TSH", "T4TOTAL", "FREET4", "T3FREE", "THYROIDAB" in the last 72 hours. Anemia Panel: No results for input(s): "VITAMINB12", "FOLATE", "FERRITIN", "TIBC", "IRON", "RETICCTPCT" in the last 72 hours. Urine analysis:    Component Value Date/Time   COLORURINE YELLOW (A) 01/28/2024 1541   APPEARANCEUR CLEAR (A) 01/28/2024 1541   APPEARANCEUR Clear 03/20/2023 1112   LABSPEC 1.039 (H) 01/28/2024 1541   PHURINE 5.0 01/28/2024 1541   GLUCOSEU NEGATIVE 01/28/2024 1541   HGBUR NEGATIVE 01/28/2024 1541   BILIRUBINUR NEGATIVE 01/28/2024 1541   BILIRUBINUR Negative 03/20/2023 1112   KETONESUR NEGATIVE 01/28/2024 1541   PROTEINUR NEGATIVE 01/28/2024 1541   UROBILINOGEN 0.2 10/31/2019 1323   NITRITE NEGATIVE 01/28/2024 1541   LEUKOCYTESUR TRACE (A)  01/28/2024 1541   Sepsis Labs: @LABRCNTIP (procalcitonin:4,lacticidven:4) ) Recent Results (from the past 240 hours)  Resp panel by RT-PCR (RSV, Flu A&B, Covid) Anterior Nasal Swab     Status: None   Collection Time: 01/28/24  3:41 PM   Specimen: Anterior Nasal Swab  Result Value Ref Range Status   SARS Coronavirus 2 by RT PCR NEGATIVE NEGATIVE Final    Comment: (NOTE) SARS-CoV-2 target nucleic acids are NOT DETECTED.  The SARS-CoV-2 RNA is generally detectable in upper respiratory specimens during the acute phase of infection. The lowest concentration of SARS-CoV-2 viral copies this assay can detect is 138 copies/mL. A negative result does not preclude SARS-Cov-2 infection and should not be used as the sole basis for treatment or other patient management decisions. A negative result may occur with  improper specimen collection/handling, submission of specimen other than nasopharyngeal swab, presence of viral mutation(s) within the areas targeted by this assay, and inadequate number of viral copies(<138 copies/mL). A negative result must be combined with clinical observations, patient history, and epidemiological information. The expected result is Negative.  Fact Sheet for Patients:  BloggerCourse.com  Fact Sheet for Healthcare Providers:  SeriousBroker.it  This test is no t yet approved or cleared by the [place] FDA and  has been authorized for detection and/or diagnosis of SARS-CoV-2 by FDA under an Emergency Use Authorization (EUA). This EUA will remain  in effect (meaning this test can be used) for the duration of the COVID-19 declaration under Section 564(b)(1) of the Act, 21 U.S.C.section 360bbb-3(b)(1), unless the authorization is terminated  or revoked sooner.       Influenza A by PCR NEGATIVE NEGATIVE Final   Influenza B by PCR NEGATIVE NEGATIVE Final    Comment: (NOTE) The Xpert Xpress SARS-CoV-2/FLU/RSV  plus assay is intended as an aid in the diagnosis of influenza from Nasopharyngeal swab specimens and should not be used as a sole basis for treatment. Nasal washings and aspirates are unacceptable for Xpert Xpress  SARS-CoV-2/FLU/RSV testing.  Fact Sheet for Patients: BloggerCourse.com  Fact Sheet for Healthcare Providers: SeriousBroker.it  This test is not yet approved or cleared by the [place] FDA and has been authorized for detection and/or diagnosis of SARS-CoV-2 by FDA under an Emergency Use Authorization (EUA). This EUA will remain in effect (meaning this test can be used) for the duration of the COVID-19 declaration under Section 564(b)(1) of the Act, 21 U.S.C. section 360bbb-3(b)(1), unless the authorization is terminated or revoked.     Resp Syncytial Virus by PCR NEGATIVE NEGATIVE Final    Comment: (NOTE) Fact Sheet for Patients: BloggerCourse.com  Fact Sheet for Healthcare Providers: SeriousBroker.it  This test is not yet approved or cleared by the [place] FDA and has been authorized for detection and/or diagnosis of SARS-CoV-2 by FDA under an Emergency Use Authorization (EUA). This EUA will remain in effect (meaning this test can be used) for the duration of the COVID-19 declaration under Section 564(b)(1) of the Act, 21 U.S.C. section 360bbb-3(b)(1), unless the authorization is terminated or revoked.  Performed at [facility], [address] [place], [place] [address]      Radiological Exams on Admission:   Assessment/Plan Principal Problem:   Abnormal LFTs Active Problems:   Abdominal pain   Nausea vomiting and diarrhea   HTN (hypertension)   HLD (hyperlipidemia)   Depression with anxiety   Assessment and Plan:   Abnormal LFTs and abdominal pain: CT scan showed possible focal ileus with dilated small bowel along the site of  surgical anastomos, also showed mild central intrahepatic biliary dilatation and dilatation of the common bile duct. Pt has mild fever of 100 in ED, WBC 10.7. will start antibiotics empirically for possible biliary system infection and need to rule out choledocholithiasis.  -will admit to telemetry bed as inpatient -IV Zosyn -Blood culture -check hepatitis panel -Check Tylenol level -As needed morphine, oxycodone for pain control -As needed Zofran for nausea vomiting -IV fluid: 1 L normal saline, then 75 cc/h -Follow-up MRCP to rule out choledocholithiasis.  Nausea vomiting and diarrhea -Supportive care -IV fluid -Check C. Difficile  HTN (hypertension): Blood pressure 105/70 -IV hydralazine as needed -Hold Cozaar due to volume depletion  HLD (hyperlipidemia) -Hold Lipitor due to abnormal liver function  Depression with anxiety -BuSpar        DVT ppx: SQ Heparin    Code Status: Full code   Family Communication:     not done, no family member is at bed side.      Disposition Plan:  Anticipate discharge back to previous environment  Consults called:  none  Admission status and Level of care: Telemetry Medical: as inpt        Dispo: The patient is from: Home              Anticipated d/c is to: Home              Anticipated d/c date is: 2 days              Patient currently is not medically stable to d/c.    Severity of Illness:  The appropriate patient status for this patient is INPATIENT. Inpatient status is judged to be reasonable and necessary in order to provide the required intensity of service to ensure the patient's safety. The patient's presenting symptoms, physical exam findings, and initial radiographic and laboratory data in the context of their chronic comorbidities is felt to place them at high risk for further clinical deterioration.  Furthermore, it is not anticipated that the patient will be medically stable for discharge from the hospital within 2  midnights of admission.   * I certify that at the point of admission it is my clinical judgment that the patient will require inpatient hospital care spanning beyond 2 midnights from the point of admission due to high intensity of service, high risk for further deterioration and high frequency of surveillance required.*       Date of Service 01/28/2024    Lorretta Harp [facility]   If 7PM-7AM, please contact night-coverage [email or web address] 01/28/2024, 9:30 PM

## 2024-01-28 NOTE — Progress Notes (Signed)
 Pharmacy Antibiotic Note  Natasha Estrada is a 53 y.o. female admitted on 01/28/2024 with  intra-abdominal infection . Patient presenting with nausea, vomiting, and diarrhea. PMH significant for hypertension, arthritis, IBS, chronic pain syndrome, anxiety, GERD. In ED, patient is afebrile with WBC 10.7. Pharmacy has been consulted for Zosyn dosing.  Plan: Start Zosyn 3.375 g IV every 8 hours Monitor renal function, clinical status, culture data, and LOT  Height: 5\' 4"  (162.6 cm) Weight: 68 kg (150 lb) IBW/kg (Calculated) : 54.7  Temp (24hrs), Avg:99.4 F (37.4 C), Min:98.8 F (37.1 C), Max:100 F (37.8 C)  Recent Labs  Lab 01/27/24 1501 01/28/24 1405  WBC 5.9 10.7*  CREATININE 0.91 0.99    Estimated Creatinine Clearance: 61.5 mL/min (by C-G formula based on SCr of 0.99 mg/dL).    Allergies  Allergen Reactions   Cefuroxime Shortness Of Breath    1.) Tolerated 1st generation cephalosporin (CEPHALEXIN) courses on 10/17/2021 and 11/11/2021 without documented ADRs. 2.) Tolerates Augmentin per husband Joe   Sulfa Antibiotics Rash   Antimicrobials this admission: Zosyn 2/24 >>   Dose adjustments this admission: N/A  Microbiology results: 2/24 BCx: pending  Thank you for involving pharmacy in this patient's care.   Rockwell Alexandria, PharmD Clinical Pharmacist 01/28/2024 6:49 PM

## 2024-01-28 NOTE — ED Triage Notes (Signed)
 First nurse note: Pt here via [facility] from home. Pt DX'ed with FLU and left before being seen.   VSS per EMS

## 2024-01-28 NOTE — ED Notes (Signed)
 Pt states she was seen yesterday and DX'ed with FLU but was D/C'ed, pt states I feel like "I'm dying". Pt endorses vomiting and diarrhea.

## 2024-01-28 NOTE — ED Notes (Signed)
 See triage note  Presents with low grade fever and n/v  Was seen yesterday placed on antibiotics  States she has not been able to keep anything down

## 2024-01-29 ENCOUNTER — Other Ambulatory Visit: Payer: Self-pay

## 2024-01-29 DIAGNOSIS — K838 Other specified diseases of biliary tract: Secondary | ICD-10-CM

## 2024-01-29 DIAGNOSIS — B179 Acute viral hepatitis, unspecified: Secondary | ICD-10-CM

## 2024-01-29 DIAGNOSIS — R197 Diarrhea, unspecified: Secondary | ICD-10-CM

## 2024-01-29 DIAGNOSIS — R7401 Elevation of levels of liver transaminase levels: Principal | ICD-10-CM

## 2024-01-29 DIAGNOSIS — R1011 Right upper quadrant pain: Secondary | ICD-10-CM

## 2024-01-29 DIAGNOSIS — R112 Nausea with vomiting, unspecified: Secondary | ICD-10-CM

## 2024-01-29 DIAGNOSIS — K567 Ileus, unspecified: Secondary | ICD-10-CM | POA: Diagnosis not present

## 2024-01-29 DIAGNOSIS — R7989 Other specified abnormal findings of blood chemistry: Principal | ICD-10-CM

## 2024-01-29 LAB — GAMMA GT: GGT: 97 U/L — ABNORMAL HIGH (ref 7–50)

## 2024-01-29 LAB — CBC
HCT: 28.6 % — ABNORMAL LOW (ref 36.0–46.0)
Hemoglobin: 9.5 g/dL — ABNORMAL LOW (ref 12.0–15.0)
MCH: 27.9 pg (ref 26.0–34.0)
MCHC: 33.2 g/dL (ref 30.0–36.0)
MCV: 84.1 fL (ref 80.0–100.0)
Platelets: 308 10*3/uL (ref 150–400)
RBC: 3.4 MIL/uL — ABNORMAL LOW (ref 3.87–5.11)
RDW: 13.6 % (ref 11.5–15.5)
WBC: 4.1 10*3/uL (ref 4.0–10.5)
nRBC: 0 % (ref 0.0–0.2)

## 2024-01-29 LAB — COMPREHENSIVE METABOLIC PANEL
ALT: 512 U/L — ABNORMAL HIGH (ref 0–44)
AST: 789 U/L — ABNORMAL HIGH (ref 15–41)
Albumin: 2.9 g/dL — ABNORMAL LOW (ref 3.5–5.0)
Alkaline Phosphatase: 154 U/L — ABNORMAL HIGH (ref 38–126)
Anion gap: 10 (ref 5–15)
BUN: 22 mg/dL — ABNORMAL HIGH (ref 6–20)
CO2: 23 mmol/L (ref 22–32)
Calcium: 7.9 mg/dL — ABNORMAL LOW (ref 8.9–10.3)
Chloride: 106 mmol/L (ref 98–111)
Creatinine, Ser: 1.05 mg/dL — ABNORMAL HIGH (ref 0.44–1.00)
GFR, Estimated: >60 mL/min (ref >60)
Glucose, Bld: 96 mg/dL (ref 70–99)
Potassium: 3.5 mmol/L (ref 3.5–5.1)
Sodium: 139 mmol/L (ref 135–145)
Total Bilirubin: 2 mg/dL — ABNORMAL HIGH (ref 0.0–1.2)
Total Protein: 5.4 g/dL — ABNORMAL LOW (ref 6.5–8.1)

## 2024-01-29 LAB — HEPATITIS PANEL, ACUTE
HCV Ab: NONREACTIVE
HCV Ab: NONREACTIVE
Hep A IgM: NONREACTIVE
Hep A IgM: NONREACTIVE
Hep B C IgM: NONREACTIVE
Hep B C IgM: NONREACTIVE
Hepatitis B Surface Ag: NONREACTIVE
Hepatitis B Surface Ag: NONREACTIVE

## 2024-01-29 LAB — HIV ANTIBODY (ROUTINE TESTING W REFLEX): HIV Screen 4th Generation wRfx: NONREACTIVE

## 2024-01-29 MED ORDER — ZOLPIDEM TARTRATE 5 MG PO TABS: 5.0000 mg | ORAL_TABLET | Freq: Every evening | ORAL | Status: DC | PRN

## 2024-01-29 NOTE — Progress Notes (Signed)
 [facility]  - [place] [facility]   PATIENT NAME: Natasha Estrada    MR#:  [id number]  DATE OF BIRTH:  [birth year]  SUBJECTIVE:  no family at bedside. Came in with generalized weakness nausea vomiting diarrhea couple days ago. Was diagnosed with flu two weeks ago. She had some low-grade fever thought she was having viral syndrome again. Denies any diarrhea since she is been in the ER    VITALS:  Blood pressure (!) 83/51, pulse 79, temperature 98.8 F (37.1 C), resp. rate 15, height 5\' 4"  (1.626 m), weight 68 kg, SpO2 98%.  PHYSICAL EXAMINATION:   GENERAL:  53 y.o.-year-old patient with no acute distress.  LUNGS: Normal breath sounds bilaterally, no wheezing CARDIOVASCULAR: S1, S2 normal. No murmur   ABDOMEN: Soft, nontender, nondistended. Bowel sounds present.  EXTREMITIES: No  edema b/l.    NEUROLOGIC: nonfocal  patient is alert and awake SKIN: No obvious rash, lesion, or ulcer.   LABORATORY PANEL:  CBC Recent Labs  Lab 01/29/24 0456  WBC 4.1  HGB 9.5*  HCT 28.6*  PLT 308    Chemistries  Recent Labs  Lab 01/29/24 0456  NA 139  K 3.5  CL 106  CO2 23  GLUCOSE 96  BUN 22*  CREATININE 1.05*  CALCIUM 7.9*  AST 789*  ALT 512*  ALKPHOS 154*  BILITOT 2.0*   Cardiac Enzymes No results for input(s): "TROPONINI" in the last 168 hours. RADIOLOGY:  MR ABDOMEN MRCP W WO CONTAST Result Date: 01/29/2024 CLINICAL DATA:  Right upper quadrant abdominal pain EXAM: MRI ABDOMEN WITHOUT AND WITH CONTRAST (INCLUDING MRCP) TECHNIQUE: Multiplanar multisequence MR imaging of the abdomen was performed both before and after the administration of intravenous contrast. Heavily T2-weighted images of the biliary and pancreatic ducts were obtained, and three-dimensional MRCP images were rendered by post processing. CONTRAST:  7mL GADAVIST GADOBUTROL 1 MMOL/ML IV SOLN COMPARISON:  CT abdomen 01/28/2024 FINDINGS: Lower chest: Unremarkable Hepatobiliary: Cholecystectomy. Mild  proximal intrahepatic biliary dilatation. Common hepatic duct 1.5 cm, common bile duct 1.4 cm in diameter, with slightly blunted distal margin of the CBD at the ampulla but I do not see a well-defined filling defect/stone. No differential enhancement in the vicinity of the ampulla/pancreatic head to indicate malignancy in this vicinity as a cause for the biliary prominence. Potential causes for the biliary dilatation include physiologic response to cholecystectomy and potentially ampullary stricture. Correlate with bilirubin levels. No focal hepatic parenchymal lesion observed. Pancreas: Borderline prominence of the dorsal pancreatic duct in the pancreatic head up to 3 mm in diameter. Spleen:  Unremarkable Adrenals/Urinary Tract:  Unremarkable Stomach/Bowel: Gastric bypass noted.  Patent gastrojejunostomy. Vascular/Lymphatic:  Unremarkable Other: Syrinx/prominent central canal noted at about the T9 level for example on image 5 series 3, this is also been shown on prior thoracic spine MRI exams including 06/18/2022. Musculoskeletal: Prior L2 vertebral augmentation. IMPRESSION: 1. Mild proximal intrahepatic biliary dilatation, common hepatic duct 1.5 cm, common bile duct 1.4 cm in diameter, with slightly blunted distal margin of the CBD at the ampulla but I do not see a well-defined filling defect/stone. Potential causes for the biliary dilatation include physiologic response to cholecystectomy and potentially ampullary stricture. Correlate with bilirubin levels. 2. Borderline prominence of the dorsal pancreatic duct in the pancreatic head up to 3 mm in diameter. 3. Gastric bypass noted. Patent gastrojejunostomy. 4. 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 Electronically Signed   By:  Gaylyn Rong M.D.   On: 01/29/2024 11:02   MR 3D Recon At Scanner Result Date: 01/29/2024 CLINICAL DATA:  Right upper quadrant abdominal  pain EXAM: MRI ABDOMEN WITHOUT AND WITH CONTRAST (INCLUDING MRCP) TECHNIQUE: Multiplanar multisequence MR imaging of the abdomen was performed both before and after the administration of intravenous contrast. Heavily T2-weighted images of the biliary and pancreatic ducts were obtained, and three-dimensional MRCP images were rendered by post processing. CONTRAST:  7mL GADAVIST GADOBUTROL 1 MMOL/ML IV SOLN COMPARISON:  CT abdomen 01/28/2024 FINDINGS: Lower chest: Unremarkable Hepatobiliary: Cholecystectomy. Mild proximal intrahepatic biliary dilatation. Common hepatic duct 1.5 cm, common bile duct 1.4 cm in diameter, with slightly blunted distal margin of the CBD at the ampulla but I do not see a well-defined filling defect/stone. No differential enhancement in the vicinity of the ampulla/pancreatic head to indicate malignancy in this vicinity as a cause for the biliary prominence. Potential causes for the biliary dilatation include physiologic response to cholecystectomy and potentially ampullary stricture. Correlate with bilirubin levels. No focal hepatic parenchymal lesion observed. Pancreas: Borderline prominence of the dorsal pancreatic duct in the pancreatic head up to 3 mm in diameter. Spleen:  Unremarkable Adrenals/Urinary Tract:  Unremarkable Stomach/Bowel: Gastric bypass noted.  Patent gastrojejunostomy. Vascular/Lymphatic:  Unremarkable Other: Syrinx/prominent central canal noted at about the T9 level for example on image 5 series 3, this is also been shown on prior thoracic spine MRI exams including 06/18/2022. Musculoskeletal: Prior L2 vertebral augmentation. IMPRESSION: 1. Mild proximal intrahepatic biliary dilatation, common hepatic duct 1.5 cm, common bile duct 1.4 cm in diameter, with slightly blunted distal margin of the CBD at the ampulla but I do not see a well-defined filling defect/stone. Potential causes for the biliary dilatation include physiologic response to cholecystectomy and potentially  ampullary stricture. Correlate with bilirubin levels. 2. Borderline prominence of the dorsal pancreatic duct in the pancreatic head up to 3 mm in diameter. 3. Gastric bypass noted. Patent gastrojejunostomy. 4. 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 Electronically Signed   By: Gaylyn Rong M.D.   On: 01/29/2024 11:02   CT ABDOMEN PELVIS W CONTRAST Result Date: 01/28/2024 CLINICAL DATA:  Abdominal pain. EXAM: CT ABDOMEN AND PELVIS WITH CONTRAST TECHNIQUE: Multidetector CT imaging of the abdomen and pelvis was performed using the standard protocol following bolus administration of intravenous contrast. RADIATION DOSE REDUCTION: This exam was performed according to the departmental dose-optimization program which includes automated exposure control, adjustment of the mA and/or kV according to patient size and/or use of iterative reconstruction technique. CONTRAST:  OMNIPAQUE IOHEXOL 300 MG/ML  SOLN COMPARISON:  August 09, 2022 FINDINGS: Lower chest: No acute abnormality. Hepatobiliary: No focal liver abnormality is seen. Status post cholecystectomy. There is mild central intrahepatic biliary dilatation. The common bile duct measures 13 mm in diameter. Pancreas: Unremarkable. No pancreatic ductal dilatation or surrounding inflammatory changes. Spleen: Normal in size without focal abnormality. Adrenals/Urinary Tract: Adrenal glands are unremarkable. Kidneys are normal in size, without renal calculi or hydronephrosis. Subcentimeter bilateral simple renal cysts are seen. Mild areas of cortical thinning and cortical scarring are noted within the left kidney. Bladder is unremarkable. Stomach/Bowel: Surgical sutures are seen throughout the gastric region. Surgically anastomosed bowel is also noted within the mid left abdomen. This is present on the prior study. An 8.7 cm long segment of dilated small bowel is seen  along the site of surgical anastomosis (approximately 6.2 cm in diameter). Appendix  appears normal. No evidence of bowel wall thickening or inflammatory changes. Vascular/Lymphatic: No significant vascular findings are present. No enlarged abdominal or pelvic lymph nodes. Reproductive: Small parenchymal calcification is seen within an otherwise normal appearing uterus. The bilateral adnexa are unremarkable. Other: No abdominal wall hernia or abnormality. No abdominopelvic ascites. Musculoskeletal: A chronic compression fracture deformity and subsequent vertebroplasty is seen at the level of L2. IMPRESSION: 1. Postoperative changes within the gastric region and mid left abdomen, as described above. 2. Dilated small bowel along the site of surgical anastomosis, which may represent a focal ileus. 3. Evidence of prior cholecystectomy with mild central intrahepatic biliary dilatation and dilatation of the common bile duct. 4. 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 Electronically Signed   By: Aram Candela M.D.   On: 01/28/2024 17:40   DG Chest 2 View Result Date: 01/27/2024 CLINICAL DATA:  Productive cough and shortness of breath. Chest pain. EXAM: CHEST - 2 VIEW COMPARISON:  09/15/2022 FINDINGS: The heart size and mediastinal contours are within normal limits. Both lungs are clear. Multiple old right rib fracture deformities again seen. IMPRESSION: No active cardiopulmonary disease. Electronically Signed   By: Danae Orleans M.D.   On: 01/27/2024 15:40    Assessment and Plan  Esbeydi Manago is a 53 y.o. female with medical history significant of hypertension, hyperlipidemia, GERD, depression with anxiety, kidney stone, syrinx of spinal cord, UTI, IBS, chronic pain, insomnia, s/p of Roux-en-Y bypass, s/p of cholecystectomy, recent flu A, who presents with abdominal pain, nausea, vomiting, diarrhea, cough.   Patient states that she was diagnosed with the flu 2  weeks ago, but has not recovered well.  She continues to have cough with greenish colored sputum production and mild SOB.    CT-abdomen/pelvis 1. Postoperative changes within the gastric region and mid left abdomen, as described above. 2. Dilated small bowel along the site of surgical anastomosis, which may represent a focal ileus. 3. Evidence of prior cholecystectomy with mild central intrahepatic biliary dilatation and dilatation of the common bile duct. 4. 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  MRCP  Mild proximal intrahepatic biliary dilatation, common hepatic duct 1.5 cm, common bile duct 1.4 cm in diameter, with slightly blunted distal margin of the CBD at the ampulla but I do not see a well-defined filling defect/stone. Potential causes for the biliary dilatation include physiologic response to cholecystectomy and potentially ampullary stricture. Correlate with bilirubin levels. 2. Borderline prominence of the dorsal pancreatic duct in the pancreatic head up to 3 mm in diameter. 3. Gastric bypass noted. Patent gastrojejunostomy. 4. 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    Abnormal LFTs and abdominal pain:  --CT scan showed possible focal ileus with dilated small bowel along the site of surgical anastomos, also showed mild central intrahepatic biliary dilatation and dilatation of the common bile duct.  --Pt has mild fever of 100 in ED, WBC 10.7. will start antibiotics empirically for possible biliary system infection and need to rule out choledocholithiasis. -- MRCP nothing acute. Patient is not having fever abdominal pain will hold off antibiotics at present -- G.I. consultation with Dr. Servando Snare. He has ordered several labs --Blood culture negative -check hepatitis panel pending --- continue IV fluids -- denies any new meds or any  over-the-counter supplement   Nausea vomiting and diarrhea -Supportive care -IV fluid -Check C. Difficile-- patient  unable to give stool sample no diarrhea   HTN (hypertension): Blood pressure 105/70 -IV hydralazine as needed -Hold Cozaar due to volume depletion   HLD (hyperlipidemia) -Hold Lipitor due to abnormal liver function   Depression with anxiety -BuSpar               DVT ppx: SQ Heparin    Code Status: Full code   Family Communication:     not done, no family member is at bed side.      Consults : G.I. Level of care: Telemetry Medical Status is: Inpatient Remains inpatient appropriate because: abnormal LFTs. Labs pending. G.I. consult    TOTAL TIME TAKING CARE OF THIS PATIENT: 50 minutes.  >50% time spent on counselling and coordination of care  Note: This dictation was prepared with Dragon dictation along with smaller phrase technology. Any transcriptional errors that result from this process are unintentional.  Enedina Finner M.D    [facility]   CC: Primary care physician; Carren Rang, PA-C

## 2024-01-29 NOTE — Consult Note (Signed)
 Midge Minium, MD [facility]  [address] [place], [place] [address] Phone: [phone] Fax : [phone]  Consultation  Referring Provider:     Dr. Allena Katz Primary Care Physician:  Carren Rang, PA-C Primary Gastroenterologist: Gentry Fitz Reason for Consultation:     Abnormal liver enzymes  Date of Admission:  01/28/2024 Date of Consultation:  01/29/2024         HPI:   Natasha Estrada is a 53 y.o. female who has a history of gastric bypass with a report of having the flu 2 weeks ago.  The patient was started on Tamiflu at that time.  She came in with a history of hypertension, IBS, chronic pain syndrome, GERD and anxiety.  She reported that she was having vomiting.  She also reported some chest pain that brought her back to the emergency department after being in the emergency department a few days ago.  She has had no further vomiting since admission.  The patient denies any alcohol abuse.  And she underwent an MRCP due to abnormal liver enzymes.  The liver enzymes showed:  Component     Latest Ref Rng 08/09/2022 01/28/2024 01/29/2024  AST     15 - 41 U/L 31  349 (H)  789 (H)   ALT     0 - 44 U/L 19  155 (H)  512 (H)   Alkaline Phosphatase     38 - 126 U/L 65  105  154 (H)   Total Bilirubin     0.0 - 1.2 mg/dL 0.8  1.6 (H)  2.0 (H)     The patient's MRI results showed:  IMPRESSION: 1. Mild proximal intrahepatic biliary dilatation, common hepatic duct 1.5 cm, common bile duct 1.4 cm in diameter, with slightly blunted distal margin of the CBD at the ampulla but I do not see a well-defined filling defect/stone. Potential causes for the biliary dilatation include physiologic response to cholecystectomy and potentially ampullary stricture. Correlate with bilirubin levels. 2. Borderline prominence of the dorsal pancreatic duct in the pancreatic head up to 3 mm in diameter. 3. Gastric bypass noted. Patent gastrojejunostomy. 4. Syrinx/prominent central canal noted at about  the T9 level, also been shown on prior thoracic spine MRI exams including 06/18/2022.  The patient denies any history of having been told of abnormal liver enzymes in the past.  She reports some vague abdominal pain on both sides of her upper abdomen.  Past Medical History:  Diagnosis Date   Anxiety    Arthritis    Chronic low back pain with left-sided sciatica    Chronic pain syndrome    Depression    Fibroid    GERD (gastroesophageal reflux disease)    Heart murmur    Echo 09/14/22   History of kidney stones    Hypertension    IBS (irritable bowel syndrome)    with constipation   Insomnia    Kidney lesion, native, left 09/2020   following renal mass protocol on 10/14/20   Kidney stone    Lumbar radiculopathy    Radiculitis, cervical    c7-c8   Sleep apnea    can't wear CPAP - anxiety   Syrinx of spinal cord (HCC) 2021   Urinary tract infection 10/2020   recurrent. on daily antibix treatment    Past Surgical History:  Procedure Laterality Date   BLADDER STONE REMOVAL  2018   CERVICAL CONE BIOPSY  2018   CHOLECYSTECTOMY     EXTRACORPOREAL SHOCK  WAVE LITHOTRIPSY Left 08/17/2022   Procedure: EXTRACORPOREAL SHOCK WAVE LITHOTRIPSY (ESWL);  Surgeon: Riki Altes, MD;  Location: [facility];  Service: Urology;  Laterality: Left;   EXTRACORPOREAL SHOCK WAVE LITHOTRIPSY Left 09/07/2022   Procedure: EXTRACORPOREAL SHOCK WAVE LITHOTRIPSY (ESWL);  Surgeon: Riki Altes, MD;  Location: [facility];  Service: Urology;  Laterality: Left;   GASTRIC ROUX-EN-Y  2013   KYPHOPLASTY N/A 12/07/2020   Procedure: L2 Kyphoplasty;  Surgeon: Kennedy Bucker, MD;  Location: [facility];  Service: Orthopedics;  Laterality: N/A;   OPEN REDUCTION INTERNAL FIXATION (ORIF) DISTAL RADIAL FRACTURE Left 09/22/2022   Procedure: OPEN REDUCTION INTERNAL FIXATION (ORIF) DISTAL RADIUS FRACTURE;  Surgeon: Kennedy Bucker, MD;  Location: [facility];  Service: Orthopedics;  Laterality: Left;  sleep apnea    ROTATOR CUFF REPAIR Left 2010   SHOULDER ARTHROSCOPY WITH SUBACROMIAL DECOMPRESSION AND OPEN ROTATOR C Left 10/19/2020   Procedure: LEFT SHOULDER ARTHROSCOPY WITH DEBRIDEMENT DECOMPRESSION AND POSSIBLE BICEP TENODESIS;  Surgeon: Christena Flake, MD;  Location: [facility];  Service: Orthopedics;  Laterality: Left;   SHOULDER ARTHROSCOPY WITH SUBACROMIAL DECOMPRESSION AND OPEN ROTATOR C Left 12/08/2021   Procedure: Left shoulder arthroscopy with debridement, decompression, and rotator cuff repair;  Surgeon: Christena Flake, MD;  Location: [facility];  Service: Orthopedics;  Laterality: Left;   TONSILLECTOMY     as a child   transforaminal epidural steroid injection Left 10/01/2020   C6-7    Prior to Admission medications   Medication Sig Start Date End Date Taking? Authorizing Provider  acetaminophen (TYLENOL) 325 MG tablet Take 2 tablets (650 mg total) by mouth every 6 (six) hours as needed for mild pain (or Fever >/= 101). 10/17/21  Yes Leeroy Bock, MD  alendronate (FOSAMAX) 70 MG tablet Take 70 mg by mouth once a week. 05/15/22  Yes [provider]  atorvastatin (LIPITOR) 20 MG tablet Take 20 mg by mouth daily. 05/15/22  Yes [provider]  azithromycin (ZITHROMAX Z-PAK) 250 MG tablet Take 2 tablets (500 mg) on  Day 1,  followed by 1 tablet (250 mg) once daily on Days 2 through 5. 01/27/24 02/01/24 Yes Bradler, Clent Jacks, MD  benzonatate (TESSALON PERLES) 100 MG capsule Take 1 capsule (100 mg total) by mouth 3 (three) times daily as needed for cough. 01/27/24 01/26/25 Yes Bradler, Clent Jacks, MD  busPIRone (BUSPAR) 15 MG tablet Take 15 mg by mouth 2 (two) times daily. 10/13/21  Yes [provider]  gabapentin (NEURONTIN) 600 MG tablet Take 600 mg by mouth daily.   Yes [provider]  hydrOXYzine (VISTARIL) 25 MG capsule Take 25 mg by mouth 3 (three) times daily. 07/11/22  Yes [provider]  losartan (COZAAR) 50 MG tablet Take 50 mg by mouth daily. 04/20/21  Yes  [provider]  promethazine (PHENERGAN) 12.5 MG tablet Take 12.5 mg by mouth every 6 (six) hours as needed for nausea. 01/28/24 02/04/24 Yes [provider]  trimethoprim (TRIMPEX) 100 MG tablet TAKE 1 TABLET(100 MG) BY MOUTH DAILY 05/29/23  Yes Vaillancourt, Samantha, PA-C  zolpidem (AMBIEN) 10 MG tablet Take 10 mg by mouth daily. 07/14/22  Yes [provider]  buPROPion (WELLBUTRIN XL) 300 MG 24 hr tablet Take 1 tablet (300 mg total) by mouth daily. Patient not taking: Reported on 01/28/2024 01/09/24 04/08/24  Neysa Hotter, MD  Cholecalciferol (VITAMIN D3 PO) Take by mouth daily. Patient not taking: Reported on 01/28/2024    [provider]  DULoxetine (CYMBALTA) 60 MG  capsule Take 1 capsule (60 mg total) by mouth daily. Patient not taking: Reported on 01/28/2024 09/14/23 03/12/24  Neysa Hotter, MD  EPINEPHrine 0.3 mg/0.3 mL IJ SOAJ injection Inject 0.3 mg into the muscle once as needed (if patient exhibits significant signs and symptoms of allergic reaction.). Patient not taking: Reported on 01/28/2024 10/17/21   Leeroy Bock, MD  methylphenidate (CONCERTA) 18 MG PO CR tablet Take 1 tablet (18 mg total) by mouth daily before breakfast. Patient not taking: Reported on 01/28/2024 12/30/23 01/29/24  Neysa Hotter, MD  methylphenidate (CONCERTA) 18 MG PO CR tablet Take 1 tablet (18 mg total) by mouth daily before breakfast. 01/29/24 02/28/24  Neysa Hotter, MD  omeprazole (PRILOSEC) 20 MG capsule Take 20 mg by mouth daily. Patient not taking: Reported on 01/28/2024 11/14/21   [provider]    Family History  Problem Relation Age of Onset   Hypertension Mother      Social History   Tobacco Use   Smoking status: Never   Smokeless tobacco: Never  Vaping Use   Vaping status: Never Used  Substance Use Topics   Alcohol use: Not Currently    Comment: rare   Drug use: Never    Allergies as of 01/28/2024 - Review Complete 01/28/2024  Allergen Reaction  Noted   Cefuroxime Shortness Of Breath 12/16/2018   Sulfa antibiotics Rash 12/16/2018    Review of Systems:    All systems reviewed and negative except where noted in HPI.   Physical Exam:  Vital signs in last 24 hours: Temp:  [98.8 F (37.1 C)-100.3 F (37.9 C)] 98.8 F (37.1 C) (02/25 0829) Pulse Rate:  [79-105] 79 (02/25 0829) Resp:  [15-16] 15 (02/25 0829) BP: (83-105)/(51-70) 83/51 (02/25 0829) SpO2:  [95 %-98 %] 98 % (02/25 0829) Last BM Date : 01/28/24 (this am -per pt - sd it was loose) General:   Pleasant, cooperative in NAD Head:  Normocephalic and atraumatic. Eyes:   No icterus.   Conjunctiva pink. PERRLA. Ears:  Normal auditory acuity. Neck:  Supple; no masses or thyroidomegaly Lungs: Respirations even and unlabored. Lungs clear to auscultation bilaterally.   No wheezes, crackles, or rhonchi.  Heart:  Regular rate and rhythm;  Without murmur, clicks, rubs or gallops Abdomen:  Soft, nondistended, mild tenderness in the upper abdomen. Normal bowel sounds. No appreciable masses or hepatomegaly.  No rebound or guarding.  Rectal:  Not performed. Msk:  Symmetrical without gross deformities.   Extremities:  Without edema, cyanosis or clubbing. Neurologic:  Alert and oriented x3;  grossly normal neurologically. Skin:  Intact without significant lesions or rashes. Cervical Nodes:  No significant cervical adenopathy. Psych:  Alert and cooperative. Normal affect.  LAB RESULTS: Recent Labs    01/27/24 1501 01/28/24 1405 01/29/24 0456  WBC 5.9 10.7* 4.1  HGB 11.0* 11.9* 9.5*  HCT 35.6* 36.8 28.6*  PLT 354 413* 308   BMET Recent Labs    01/27/24 1501 01/28/24 1405 01/29/24 0456  NA 139 139 139  K 4.2 4.3 3.5  CL 109 106 106  CO2 20* 22 23  GLUCOSE 104* 140* 96  BUN 18 27* 22*  CREATININE 0.91 0.99 1.05*  CALCIUM 8.8* 8.8* 7.9*   LFT Recent Labs    01/29/24 0456  PROT 5.4*  ALBUMIN 2.9*  AST 789*  ALT 512*  ALKPHOS 154*  BILITOT 2.0*    PT/INR Recent Labs    01/28/24 2137  LABPROT 14.1  INR 1.1    STUDIES: MR  ABDOMEN MRCP W WO CONTAST Result Date: 01/29/2024 CLINICAL DATA:  Right upper quadrant abdominal pain EXAM: MRI ABDOMEN WITHOUT AND WITH CONTRAST (INCLUDING MRCP) TECHNIQUE: Multiplanar multisequence MR imaging of the abdomen was performed both before and after the administration of intravenous contrast. Heavily T2-weighted images of the biliary and pancreatic ducts were obtained, and three-dimensional MRCP images were rendered by post processing. CONTRAST:  7mL GADAVIST GADOBUTROL 1 MMOL/ML IV SOLN COMPARISON:  CT abdomen 01/28/2024 FINDINGS: Lower chest: Unremarkable Hepatobiliary: Cholecystectomy. Mild proximal intrahepatic biliary dilatation. Common hepatic duct 1.5 cm, common bile duct 1.4 cm in diameter, with slightly blunted distal margin of the CBD at the ampulla but I do not see a well-defined filling defect/stone. No differential enhancement in the vicinity of the ampulla/pancreatic head to indicate malignancy in this vicinity as a cause for the biliary prominence. Potential causes for the biliary dilatation include physiologic response to cholecystectomy and potentially ampullary stricture. Correlate with bilirubin levels. No focal hepatic parenchymal lesion observed. Pancreas: Borderline prominence of the dorsal pancreatic duct in the pancreatic head up to 3 mm in diameter. Spleen:  Unremarkable Adrenals/Urinary Tract:  Unremarkable Stomach/Bowel: Gastric bypass noted.  Patent gastrojejunostomy. Vascular/Lymphatic:  Unremarkable Other: Syrinx/prominent central canal noted at about the T9 level for example on image 5 series 3, this is also been shown on prior thoracic spine MRI exams including 06/18/2022. Musculoskeletal: Prior L2 vertebral augmentation. IMPRESSION: 1. Mild proximal intrahepatic biliary dilatation, common hepatic duct 1.5 cm, common bile duct 1.4 cm in diameter, with slightly blunted distal margin of  the CBD at the ampulla but I do not see a well-defined filling defect/stone. Potential causes for the biliary dilatation include physiologic response to cholecystectomy and potentially ampullary stricture. Correlate with bilirubin levels. 2. Borderline prominence of the dorsal pancreatic duct in the pancreatic head up to 3 mm in diameter. 3. Gastric bypass noted. Patent gastrojejunostomy. 4. 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 Electronically Signed   By: Gaylyn Rong M.D.   On: 01/29/2024 11:02   MR 3D Recon At Scanner Result Date: 01/29/2024 CLINICAL DATA:  Right upper quadrant abdominal pain EXAM: MRI ABDOMEN WITHOUT AND WITH CONTRAST (INCLUDING MRCP) TECHNIQUE: Multiplanar multisequence MR imaging of the abdomen was performed both before and after the administration of intravenous contrast. Heavily T2-weighted images of the biliary and pancreatic ducts were obtained, and three-dimensional MRCP images were rendered by post processing. CONTRAST:  7mL GADAVIST GADOBUTROL 1 MMOL/ML IV SOLN COMPARISON:  CT abdomen 01/28/2024 FINDINGS: Lower chest: Unremarkable Hepatobiliary: Cholecystectomy. Mild proximal intrahepatic biliary dilatation. Common hepatic duct 1.5 cm, common bile duct 1.4 cm in diameter, with slightly blunted distal margin of the CBD at the ampulla but I do not see a well-defined filling defect/stone. No differential enhancement in the vicinity of the ampulla/pancreatic head to indicate malignancy in this vicinity as a cause for the biliary prominence. Potential causes for the biliary dilatation include physiologic response to cholecystectomy and potentially ampullary stricture. Correlate with bilirubin levels. No focal hepatic parenchymal lesion observed. Pancreas: Borderline prominence of the dorsal pancreatic duct in the pancreatic head up to 3 mm in diameter. Spleen:  Unremarkable Adrenals/Urinary  Tract:  Unremarkable Stomach/Bowel: Gastric bypass noted.  Patent gastrojejunostomy. Vascular/Lymphatic:  Unremarkable Other: Syrinx/prominent central canal noted at about the T9 level for example on image 5 series 3, this is also been shown on prior thoracic spine MRI exams including 06/18/2022. Musculoskeletal: Prior L2 vertebral augmentation. IMPRESSION:  1. Mild proximal intrahepatic biliary dilatation, common hepatic duct 1.5 cm, common bile duct 1.4 cm in diameter, with slightly blunted distal margin of the CBD at the ampulla but I do not see a well-defined filling defect/stone. Potential causes for the biliary dilatation include physiologic response to cholecystectomy and potentially ampullary stricture. Correlate with bilirubin levels. 2. Borderline prominence of the dorsal pancreatic duct in the pancreatic head up to 3 mm in diameter. 3. Gastric bypass noted. Patent gastrojejunostomy. 4. 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 Electronically Signed   By: Gaylyn Rong M.D.   On: 01/29/2024 11:02   CT ABDOMEN PELVIS W CONTRAST Result Date: 01/28/2024 CLINICAL DATA:  Abdominal pain. EXAM: CT ABDOMEN AND PELVIS WITH CONTRAST TECHNIQUE: Multidetector CT imaging of the abdomen and pelvis was performed using the standard protocol following bolus administration of intravenous contrast. RADIATION DOSE REDUCTION: This exam was performed according to the departmental dose-optimization program which includes automated exposure control, adjustment of the mA and/or kV according to patient size and/or use of iterative reconstruction technique. CONTRAST:  OMNIPAQUE IOHEXOL 300 MG/ML  SOLN COMPARISON:  August 09, 2022 FINDINGS: Lower chest: No acute abnormality. Hepatobiliary: No focal liver abnormality is seen. Status post cholecystectomy. There is mild central intrahepatic biliary dilatation. The common bile duct  measures 13 mm in diameter. Pancreas: Unremarkable. No pancreatic ductal dilatation or surrounding inflammatory changes. Spleen: Normal in size without focal abnormality. Adrenals/Urinary Tract: Adrenal glands are unremarkable. Kidneys are normal in size, without renal calculi or hydronephrosis. Subcentimeter bilateral simple renal cysts are seen. Mild areas of cortical thinning and cortical scarring are noted within the left kidney. Bladder is unremarkable. Stomach/Bowel: Surgical sutures are seen throughout the gastric region. Surgically anastomosed bowel is also noted within the mid left abdomen. This is present on the prior study. An 8.7 cm long segment of dilated small bowel is seen along the site of surgical anastomosis (approximately 6.2 cm in diameter). Appendix appears normal. No evidence of bowel wall thickening or inflammatory changes. Vascular/Lymphatic: No significant vascular findings are present. No enlarged abdominal or pelvic lymph nodes. Reproductive: Small parenchymal calcification is seen within an otherwise normal appearing uterus. The bilateral adnexa are unremarkable. Other: No abdominal wall hernia or abnormality. No abdominopelvic ascites. Musculoskeletal: A chronic compression fracture deformity and subsequent vertebroplasty is seen at the level of L2. IMPRESSION: 1. Postoperative changes within the gastric region and mid left abdomen, as described above. 2. Dilated small bowel along the site of surgical anastomosis, which may represent a focal ileus. 3. Evidence of prior cholecystectomy with mild central intrahepatic biliary dilatation and dilatation of the common bile duct. 4. 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 Electronically Signed   By: Aram Candela M.D.   On: 01/28/2024 17:40   DG Chest 2 View Result Date: 01/27/2024 CLINICAL DATA:  Productive cough and shortness of breath. Chest pain. EXAM: CHEST - 2 VIEW COMPARISON:   09/15/2022 FINDINGS: The heart size and mediastinal contours are within normal limits. Both lungs are clear. Multiple old right rib fracture deformities again seen. IMPRESSION: No active cardiopulmonary disease. Electronically Signed   By: Danae Orleans M.D.   On: 01/27/2024 15:40      Impression / Plan:   Assessment: Principal Problem:   Abnormal LFTs Active Problems:   HTN (hypertension)   HLD (hyperlipidemia)   Depression with anxiety   Abdominal pain  Nausea vomiting and diarrhea   Natasha Estrada is a 53 y.o. y/o female with a history of gastric bypass with a dilated common bile duct but no stones seen on the MRCP.  The patient's liver enzymes have been trending up therefore GI consult was called.  The patient had a recent infection with the flu and was taking Tamiflu.  She denies any other supplemental medications or other recently started medications.  Plan:  The patient has acute hepatitis of unknown etiology.  The patient will have her blood sent off for possible causes of abnormal liver enzymes including ANA, SMA, AMA, alpha 1 antitrypsin, ceruloplasmin and acute hepatitis panel.  The patient will also have CMV and EBV sent off.  The patient will be started on a regular diet.  There is a known risk of Tamiflu causing abnormal liver enzymes in 1% of patients who take it.  The patient will be monitored for any further elevation of her liver enzymes and we will wait for the labs to come back to see if there is any other cause.  The patient and her family have been explained the plan and agree with it.  Thank you for involving me in the care of this patient.      LOS: 1 day   Midge Minium, MD, MD. Clementeen Graham 01/29/2024, 2:32 PM,  Pager [phone] 7am-5pm  Check AMION for 5pm -7am coverage and on weekends   Note: This dictation was prepared with Dragon dictation along with smaller phrase technology. Any transcriptional errors that result from this process are unintentional.

## 2024-01-29 NOTE — Progress Notes (Signed)
Per Dr Posey Pronto, d.c tele monitoring

## 2024-01-30 DIAGNOSIS — R7989 Other specified abnormal findings of blood chemistry: Secondary | ICD-10-CM | POA: Diagnosis not present

## 2024-01-30 LAB — MITOCHONDRIAL ANTIBODIES: Mitochondrial M2 Ab, IgG: <20 U (ref 0.0–20.0)

## 2024-01-30 LAB — ANTI-SMOOTH MUSCLE ANTIBODY, IGG: F-Actin IgG: 2 U (ref 0–19)

## 2024-01-30 LAB — COMPREHENSIVE METABOLIC PANEL
ALT: 300 U/L — ABNORMAL HIGH (ref 0–44)
AST: 251 U/L — ABNORMAL HIGH (ref 15–41)
Albumin: 3 g/dL — ABNORMAL LOW (ref 3.5–5.0)
Alkaline Phosphatase: 137 U/L — ABNORMAL HIGH (ref 38–126)
Anion gap: 6 (ref 5–15)
BUN: 21 mg/dL — ABNORMAL HIGH (ref 6–20)
CO2: 24 mmol/L (ref 22–32)
Calcium: 7.8 mg/dL — ABNORMAL LOW (ref 8.9–10.3)
Chloride: 110 mmol/L (ref 98–111)
Creatinine, Ser: 0.88 mg/dL (ref 0.44–1.00)
GFR, Estimated: >60 mL/min (ref >60)
Glucose, Bld: 84 mg/dL (ref 70–99)
Potassium: 3.8 mmol/L (ref 3.5–5.1)
Sodium: 140 mmol/L (ref 135–145)
Total Bilirubin: 0.8 mg/dL (ref 0.0–1.2)
Total Protein: 5.5 g/dL — ABNORMAL LOW (ref 6.5–8.1)

## 2024-01-30 LAB — EPSTEIN-BARR VIRUS (EBV) ANTIBODY PROFILE
EBV NA IgG: 160 U/mL — ABNORMAL HIGH (ref 0.0–17.9)
EBV VCA IgG: >600 U/mL — ABNORMAL HIGH (ref 0.0–17.9)
EBV VCA IgM: <36 U/mL (ref 0.0–35.9)

## 2024-01-30 LAB — PROTIME-INR
INR: 1 (ref 0.8–1.2)
Prothrombin Time: 13.1 s (ref 11.4–15.2)

## 2024-01-30 LAB — CMV IGM: CMV IgM: <30 [AU]/ml (ref 0.0–29.9)

## 2024-01-30 LAB — CMV ANTIBODY, IGG (EIA): CMV Ab - IgG: <0.6 U/mL (ref 0.00–0.59)

## 2024-01-30 LAB — ANA W/REFLEX: Anti Nuclear Antibody (ANA): NEGATIVE

## 2024-01-30 MED ORDER — POLYETHYLENE GLYCOL 3350 17 G PO PACK
17.0000 g | PACK | Freq: Two times a day (BID) | ORAL | Status: DC
  Administered 2024-01-30: 17 g via ORAL
  Filled 2024-01-30: qty 1

## 2024-01-30 NOTE — Progress Notes (Signed)
 Patient being discharged home. PIV removed. Discharge instructions were went over with patient. Patient is going home POV with son.

## 2024-01-30 NOTE — Discharge Summary (Signed)
 Natasha Estrada EXB:[id number] DOB: [birth year] DOA: 01/28/2024  PCP: Carren Rang, PA-C  Admit date: 01/28/2024 Discharge date: 01/30/2024  Time spent: 35 minutes  Recommendations for Outpatient Follow-up:  Pcp f/u 1 week, check LFTs then, consider GI f/u if fail to normalize     Discharge Diagnoses:  Principal Problem:   Abnormal LFTs Active Problems:   Abdominal pain   Nausea vomiting and diarrhea   HTN (hypertension)   HLD (hyperlipidemia)   Depression with anxiety   Ileus (HCC)   RUQ abdominal pain   Transaminitis   Discharge Condition: improved  Diet recommendation: heart healthy  Filed Weights   01/28/24 1402  Weight: 68 kg    History of present illness:  From admission h and p Natasha Estrada is a 53 y.o. female with medical history significant of hypertension, hyperlipidemia, GERD, depression with anxiety, kidney stone, syrinx of spinal cord, UTI, IBS, chronic pain, insomnia, s/p of Roux-en-Y bypass, s/p of cholecystectomy, recent flu A, who presents with abdominal pain, nausea, vomiting, diarrhea, cough.   Patient states that she was diagnosed with the flu 2 weeks ago, but has not recovered well.  She continues to have cough with greenish colored sputum production and mild SOB.  Patient had some chest discomfort earlier, which has resolved.  Currently no active chest pain.  States that since this morning, she developed nausea, vomiting, diarrhea and abdominal pain.  She has had multiple episodes of nonbilious nonbloody vomiting.  She has had few episodes of watery diarrhea.  Her abdominal pain is located in the middle abdomen, which is constant, aching, nonradiating, not aggravated or alleviated by any known factors.  Denies symptoms of UTI.  Patient has fever and chills.  Hospital Course:  Patient with recent flu presenting with chest pain, nausea, vomiting. Chest pain evaluation negative and that has resolved. Nausea/vomiting also resolved. Found to have marked LFT  elevation. GI consulted. Hepatitis panel negative, ana neg, nitochondrial antibioties neg, anti-sm neg, cmv neg, ceruloplasmin/alpha-1-antitry/ebv still in process. MRCP with proximal mild intrahepatic biliary dilation and slight blunted distal margin of cbd at ambulla, no well defined stone or filling defect. As bilirubin has normalized and lfts have significantly down-trended, gi (wohl) does not think there is an obstruction and has signed off. Thinks this could have been response to recent tamiflu. Gen surg also reviewed images and agrees, though both advise w/u at tertiary care center should symptoms recur as given her prior gastric bypass, any interventions would require specialty services our hospital doesn't have. As patient's pain is resolved, abdomen is soft, she is tolerating diet, and labs are much improved, will d/c home, advise pcp f/u in about a week to repeat LFTs, consider GI f/u if remain elevated.  Procedures: none   Consultations: GI  Discharge Exam: Vitals:   01/30/24 0456 01/30/24 0829  BP: 112/60 (!) 122/58  Pulse: 73 74  Resp:  18  Temp:  99.1 F (37.3 C)  SpO2:  98%    General: NAD Cardiovascular: RRR Respiratory: CTAB Abdomen: soft, non-tender  Discharge Instructions   Discharge Instructions     Diet - low sodium heart healthy   Complete by: As directed    Increase activity slowly   Complete by: As directed       Allergies as of 01/30/2024       Reactions   Cefuroxime Shortness Of Breath   1.) Tolerated 1st generation cephalosporin (CEPHALEXIN) courses on 10/17/2021 and 11/11/2021 without documented ADRs. 2.) Tolerates Augmentin per husband Joe  Sulfa Antibiotics Rash        Medication List     STOP taking these medications    azithromycin 250 MG tablet Commonly known as: Zithromax Z-Pak   buPROPion 300 MG 24 hr tablet Commonly known as: WELLBUTRIN XL   DULoxetine 60 MG capsule Commonly known as: CYMBALTA   omeprazole 20 MG  capsule Commonly known as: PRILOSEC   trimethoprim 100 MG tablet Commonly known as: TRIMPEX   VITAMIN D3 PO       TAKE these medications    acetaminophen 325 MG tablet Commonly known as: TYLENOL Take 2 tablets (650 mg total) by mouth every 6 (six) hours as needed for mild pain (or Fever >/= 101).   alendronate 70 MG tablet Commonly known as: FOSAMAX Take 70 mg by mouth once a week.   atorvastatin 20 MG tablet Commonly known as: LIPITOR Take 20 mg by mouth daily.   benzonatate 100 MG capsule Commonly known as: Tessalon Perles Take 1 capsule (100 mg total) by mouth 3 (three) times daily as needed for cough.   busPIRone 15 MG tablet Commonly known as: BUSPAR Take 15 mg by mouth 2 (two) times daily.   EPINEPHrine 0.3 mg/0.3 mL Soaj injection Commonly known as: EPI-PEN Inject 0.3 mg into the muscle once as needed (if patient exhibits significant signs and symptoms of allergic reaction.).   gabapentin 600 MG tablet Commonly known as: NEURONTIN Take 600 mg by mouth daily.   hydrOXYzine 25 MG capsule Commonly known as: VISTARIL Take 25 mg by mouth 3 (three) times daily.   losartan 50 MG tablet Commonly known as: COZAAR Take 50 mg by mouth daily.   methylphenidate 18 MG CR tablet Commonly known as: Concerta Take 1 tablet (18 mg total) by mouth daily before breakfast. What changed: Another medication with the same name was removed. Continue taking this medication, and follow the directions you see here.   promethazine 12.5 MG tablet Commonly known as: PHENERGAN Take 12.5 mg by mouth every 6 (six) hours as needed for nausea.   zolpidem 10 MG tablet Commonly known as: AMBIEN Take 10 mg by mouth daily.       Allergies  Allergen Reactions   Cefuroxime Shortness Of Breath    1.) Tolerated 1st generation cephalosporin (CEPHALEXIN) courses on 10/17/2021 and 11/11/2021 without documented ADRs. 2.) Tolerates Augmentin per husband Joe   Sulfa Antibiotics Rash     Follow-up Information     Carren Rang, PA-C Follow up.   Specialty: Physician Assistant Contact information: [address] [place] [address] [phone]                  The results of significant diagnostics from this hospitalization (including imaging, microbiology, ancillary and laboratory) are listed below for reference.    Significant Diagnostic Studies: MR ABDOMEN MRCP W WO CONTAST Result Date: 01/29/2024 CLINICAL DATA:  Right upper quadrant abdominal pain EXAM: MRI ABDOMEN WITHOUT AND WITH CONTRAST (INCLUDING MRCP) TECHNIQUE: Multiplanar multisequence MR imaging of the abdomen was performed both before and after the administration of intravenous contrast. Heavily T2-weighted images of the biliary and pancreatic ducts were obtained, and three-dimensional MRCP images were rendered by post processing. CONTRAST:  7mL GADAVIST GADOBUTROL 1 MMOL/ML IV SOLN COMPARISON:  CT abdomen 01/28/2024 FINDINGS: Lower chest: Unremarkable Hepatobiliary: Cholecystectomy. Mild proximal intrahepatic biliary dilatation. Common hepatic duct 1.5 cm, common bile duct 1.4 cm in diameter, with slightly blunted distal margin of the CBD at the ampulla but I do  not see a well-defined filling defect/stone. No differential enhancement in the vicinity of the ampulla/pancreatic head to indicate malignancy in this vicinity as a cause for the biliary prominence. Potential causes for the biliary dilatation include physiologic response to cholecystectomy and potentially ampullary stricture. Correlate with bilirubin levels. No focal hepatic parenchymal lesion observed. Pancreas: Borderline prominence of the dorsal pancreatic duct in the pancreatic head up to 3 mm in diameter. Spleen:  Unremarkable Adrenals/Urinary Tract:  Unremarkable Stomach/Bowel: Gastric bypass noted.  Patent gastrojejunostomy. Vascular/Lymphatic:  Unremarkable Other: Syrinx/prominent central canal noted at about the T9  level for example on image 5 series 3, this is also been shown on prior thoracic spine MRI exams including 06/18/2022. Musculoskeletal: Prior L2 vertebral augmentation. IMPRESSION: 1. Mild proximal intrahepatic biliary dilatation, common hepatic duct 1.5 cm, common bile duct 1.4 cm in diameter, with slightly blunted distal margin of the CBD at the ampulla but I do not see a well-defined filling defect/stone. Potential causes for the biliary dilatation include physiologic response to cholecystectomy and potentially ampullary stricture. Correlate with bilirubin levels. 2. Borderline prominence of the dorsal pancreatic duct in the pancreatic head up to 3 mm in diameter. 3. Gastric bypass noted. Patent gastrojejunostomy. 4. 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 Electronically Signed   By: Gaylyn Rong M.D.   On: 01/29/2024 11:02   MR 3D Recon At Scanner Result Date: 01/29/2024 CLINICAL DATA:  Right upper quadrant abdominal pain EXAM: MRI ABDOMEN WITHOUT AND WITH CONTRAST (INCLUDING MRCP) TECHNIQUE: Multiplanar multisequence MR imaging of the abdomen was performed both before and after the administration of intravenous contrast. Heavily T2-weighted images of the biliary and pancreatic ducts were obtained, and three-dimensional MRCP images were rendered by post processing. CONTRAST:  7mL GADAVIST GADOBUTROL 1 MMOL/ML IV SOLN COMPARISON:  CT abdomen 01/28/2024 FINDINGS: Lower chest: Unremarkable Hepatobiliary: Cholecystectomy. Mild proximal intrahepatic biliary dilatation. Common hepatic duct 1.5 cm, common bile duct 1.4 cm in diameter, with slightly blunted distal margin of the CBD at the ampulla but I do not see a well-defined filling defect/stone. No differential enhancement in the vicinity of the ampulla/pancreatic head to indicate malignancy in this vicinity as a cause for the biliary prominence. Potential causes for  the biliary dilatation include physiologic response to cholecystectomy and potentially ampullary stricture. Correlate with bilirubin levels. No focal hepatic parenchymal lesion observed. Pancreas: Borderline prominence of the dorsal pancreatic duct in the pancreatic head up to 3 mm in diameter. Spleen:  Unremarkable Adrenals/Urinary Tract:  Unremarkable Stomach/Bowel: Gastric bypass noted.  Patent gastrojejunostomy. Vascular/Lymphatic:  Unremarkable Other: Syrinx/prominent central canal noted at about the T9 level for example on image 5 series 3, this is also been shown on prior thoracic spine MRI exams including 06/18/2022. Musculoskeletal: Prior L2 vertebral augmentation. IMPRESSION: 1. Mild proximal intrahepatic biliary dilatation, common hepatic duct 1.5 cm, common bile duct 1.4 cm in diameter, with slightly blunted distal margin of the CBD at the ampulla but I do not see a well-defined filling defect/stone. Potential causes for the biliary dilatation include physiologic response to cholecystectomy and potentially ampullary stricture. Correlate with bilirubin levels. 2. Borderline prominence of the dorsal pancreatic duct in the pancreatic head up to 3 mm in diameter. 3. Gastric bypass noted. Patent gastrojejunostomy. 4. 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 Electronically Signed   By: Gaylyn Rong M.D.   On: 01/29/2024  11:02   CT ABDOMEN PELVIS W CONTRAST Result Date: 01/28/2024 CLINICAL DATA:  Abdominal pain. EXAM: CT ABDOMEN AND PELVIS WITH CONTRAST TECHNIQUE: Multidetector CT imaging of the abdomen and pelvis was performed using the standard protocol following bolus administration of intravenous contrast. RADIATION DOSE REDUCTION: This exam was performed according to the departmental dose-optimization program which includes automated exposure control, adjustment of the mA and/or kV according to patient size  and/or use of iterative reconstruction technique. CONTRAST:  OMNIPAQUE IOHEXOL 300 MG/ML  SOLN COMPARISON:  August 09, 2022 FINDINGS: Lower chest: No acute abnormality. Hepatobiliary: No focal liver abnormality is seen. Status post cholecystectomy. There is mild central intrahepatic biliary dilatation. The common bile duct measures 13 mm in diameter. Pancreas: Unremarkable. No pancreatic ductal dilatation or surrounding inflammatory changes. Spleen: Normal in size without focal abnormality. Adrenals/Urinary Tract: Adrenal glands are unremarkable. Kidneys are normal in size, without renal calculi or hydronephrosis. Subcentimeter bilateral simple renal cysts are seen. Mild areas of cortical thinning and cortical scarring are noted within the left kidney. Bladder is unremarkable. Stomach/Bowel: Surgical sutures are seen throughout the gastric region. Surgically anastomosed bowel is also noted within the mid left abdomen. This is present on the prior study. An 8.7 cm long segment of dilated small bowel is seen along the site of surgical anastomosis (approximately 6.2 cm in diameter). Appendix appears normal. No evidence of bowel wall thickening or inflammatory changes. Vascular/Lymphatic: No significant vascular findings are present. No enlarged abdominal or pelvic lymph nodes. Reproductive: Small parenchymal calcification is seen within an otherwise normal appearing uterus. The bilateral adnexa are unremarkable. Other: No abdominal wall hernia or abnormality. No abdominopelvic ascites. Musculoskeletal: A chronic compression fracture deformity and subsequent vertebroplasty is seen at the level of L2. IMPRESSION: 1. Postoperative changes within the gastric region and mid left abdomen, as described above. 2. Dilated small bowel along the site of surgical anastomosis, which may represent a focal ileus. 3. Evidence of prior cholecystectomy with mild central intrahepatic biliary dilatation and dilatation of the  common bile duct. 4. 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 Electronically Signed   By: Aram Candela M.D.   On: 01/28/2024 17:40   DG Chest 2 View Result Date: 01/27/2024 CLINICAL DATA:  Productive cough and shortness of breath. Chest pain. EXAM: CHEST - 2 VIEW COMPARISON:  09/15/2022 FINDINGS: The heart size and mediastinal contours are within normal limits. Both lungs are clear. Multiple old right rib fracture deformities again seen. IMPRESSION: No active cardiopulmonary disease. Electronically Signed   By: Danae Orleans M.D.   On: 01/27/2024 15:40    Microbiology: Recent Results (from the past 240 hours)  Resp panel by RT-PCR (RSV, Flu A&B, Covid) Anterior Nasal Swab     Status: None   Collection Time: 01/28/24  3:41 PM   Specimen: Anterior Nasal Swab  Result Value Ref Range Status   SARS Coronavirus 2 by RT PCR NEGATIVE NEGATIVE Final    Comment: (NOTE) SARS-CoV-2 target nucleic acids are NOT DETECTED.  The SARS-CoV-2 RNA is generally detectable in upper respiratory specimens during the acute phase of infection. The lowest concentration of SARS-CoV-2 viral copies this assay can detect is 138 copies/mL. A negative result does not preclude SARS-Cov-2 infection and should not be used as the sole basis for treatment or other patient management decisions. A negative result may occur with  improper specimen collection/handling, submission of specimen other than nasopharyngeal swab, presence of viral mutation(s) within the areas targeted by  this assay, and inadequate number of viral copies(<138 copies/mL). A negative result must be combined with clinical observations, patient history, and epidemiological information. The expected result is Negative.  Fact Sheet for Patients:  BloggerCourse.com  Fact Sheet for Healthcare Providers:  SeriousBroker.it  This test is no t yet approved or  cleared by the [place] FDA and  has been authorized for detection and/or diagnosis of SARS-CoV-2 by FDA under an Emergency Use Authorization (EUA). This EUA will remain  in effect (meaning this test can be used) for the duration of the COVID-19 declaration under Section 564(b)(1) of the Act, 21 U.S.C.section 360bbb-3(b)(1), unless the authorization is terminated  or revoked sooner.       Influenza A by PCR NEGATIVE NEGATIVE Final   Influenza B by PCR NEGATIVE NEGATIVE Final    Comment: (NOTE) The Xpert Xpress SARS-CoV-2/FLU/RSV plus assay is intended as an aid in the diagnosis of influenza from Nasopharyngeal swab specimens and should not be used as a sole basis for treatment. Nasal washings and aspirates are unacceptable for Xpert Xpress SARS-CoV-2/FLU/RSV testing.  Fact Sheet for Patients: BloggerCourse.com  Fact Sheet for Healthcare Providers: SeriousBroker.it  This test is not yet approved or cleared by the [place] FDA and has been authorized for detection and/or diagnosis of SARS-CoV-2 by FDA under an Emergency Use Authorization (EUA). This EUA will remain in effect (meaning this test can be used) for the duration of the COVID-19 declaration under Section 564(b)(1) of the Act, 21 U.S.C. section 360bbb-3(b)(1), unless the authorization is terminated or revoked.     Resp Syncytial Virus by PCR NEGATIVE NEGATIVE Final    Comment: (NOTE) Fact Sheet for Patients: BloggerCourse.com  Fact Sheet for Healthcare Providers: SeriousBroker.it  This test is not yet approved or cleared by the [place] FDA and has been authorized for detection and/or diagnosis of SARS-CoV-2 by FDA under an Emergency Use Authorization (EUA). This EUA will remain in effect (meaning this test can be used) for the duration of the COVID-19 declaration under Section 564(b)(1) of the Act, 21  U.S.C. section 360bbb-3(b)(1), unless the authorization is terminated or revoked.  Performed at [facility], [address] [place], [place] [address]   Culture, blood (Routine X 2) w Reflex to ID Panel     Status: None (Preliminary result)   Collection Time: 01/28/24  9:38 PM   Specimen: BLOOD  Result Value Ref Range Status   Specimen Description BLOOD BLOOD RIGHT ARM  Final   Special Requests   Final    BOTTLES DRAWN AEROBIC AND ANAEROBIC Blood Culture adequate volume   Culture   Final    NO GROWTH 2 DAYS Performed at [facility], [address] [place], [place] [address]    Report Status PENDING  Incomplete  Culture, blood (Routine X 2) w Reflex to ID Panel     Status: None (Preliminary result)   Collection Time: 01/28/24  9:38 PM   Specimen: BLOOD  Result Value Ref Range Status   Specimen Description BLOOD BLOOD LEFT HAND  Final   Special Requests   Final    BOTTLES DRAWN AEROBIC AND ANAEROBIC Blood Culture adequate volume   Culture   Final    NO GROWTH 2 DAYS Performed at [facility], [address], [place], [place] [address]    Report Status PENDING  Incomplete     Labs: Basic Metabolic Panel: Recent Labs  Lab 01/27/24 1501 01/28/24 1405 01/29/24 0456 01/30/24 0518  NA 139 139  139 140  K 4.2 4.3 3.5 3.8  CL 109 106 106 110  CO2 20* 22 23 24   GLUCOSE 104* 140* 96 84  BUN 18 27* 22* 21*  CREATININE 0.91 0.99 1.05* 0.88  CALCIUM 8.8* 8.8* 7.9* 7.8*   Liver Function Tests: Recent Labs  Lab 01/28/24 1405 01/29/24 0456 01/30/24 0518  AST 349* 789* 251*  ALT 155* 512* 300*  ALKPHOS 105 154* 137*  BILITOT 1.6* 2.0* 0.8  PROT 6.6 5.4* 5.5*  ALBUMIN 3.7 2.9* 3.0*   Recent Labs  Lab 01/28/24 1405  LIPASE 32   No results for input(s): "AMMONIA" in the last 168 hours. CBC: Recent Labs  Lab 01/27/24 1501 01/28/24 1405 01/29/24 0456  WBC 5.9 10.7* 4.1  NEUTROABS  --  10.2*  --   HGB 11.0* 11.9* 9.5*  HCT 35.6*  36.8 28.6*  MCV 90.6 85.6 84.1  PLT 354 413* 308   Cardiac Enzymes: No results for input(s): "CKTOTAL", "CKMB", "CKMBINDEX", "TROPONINI" in the last 168 hours. BNP: BNP (last 3 results) No results for input(s): "BNP" in the last 8760 hours.  ProBNP (last 3 results) No results for input(s): "PROBNP" in the last 8760 hours.  CBG: No results for input(s): "GLUCAP" in the last 168 hours.     Signed:  Silvano Bilis MD.  [facility] 01/30/2024, 2:58 PM

## 2024-01-30 NOTE — Progress Notes (Signed)
 Midge Minium, MD [facility]   [address], [address] [place], [place] [address] Phone: [phone] Fax : [phone]   Subjective: The patient reports that she was a little nauseous with peanut butter yesterday.  She is on a regular diet.  There is no further vomiting.  Her liver enzymes have dramatically decreased overnight.  Labs have been sent off for possible causes of her abnormal liver enzymes and those are now pending.  Patient's hemoglobin had gone from 11 on admission to 9.5 without any sign of any GI bleeding.  She denies any black stools or bloody stools.  The patient reports that she had been having anemia in the past but that was when she was having periods which is no longer an issue.   Objective: Vital signs in last 24 hours: Vitals:   01/29/24 0829 01/29/24 1513 01/30/24 0455 01/30/24 0456  BP: (!) 83/51 (!) 89/52 (!) 90/59 112/60  Pulse: 79 78 74 73  Resp: 15 17 18    Temp: 98.8 F (37.1 C) 97.7 F (36.5 C) 97.8 F (36.6 C)   TempSrc:  Oral    SpO2: 98% 97% 99%   Weight:      Height:       Weight change:  No intake or output data in the 24 hours ending 01/30/24 0825   Exam: General: The patient is sitting up in bed in no apparent distress alert and orientated x 3  Lab Results: @LABTEST2 @ Micro Results: Recent Results (from the past 240 hours)  Resp panel by RT-PCR (RSV, Flu A&B, Covid) Anterior Nasal Swab     Status: None   Collection Time: 01/28/24  3:41 PM   Specimen: Anterior Nasal Swab  Result Value Ref Range Status   SARS Coronavirus 2 by RT PCR NEGATIVE NEGATIVE Final    Comment: (NOTE) SARS-CoV-2 target nucleic acids are NOT DETECTED.  The SARS-CoV-2 RNA is generally detectable in upper respiratory specimens during the acute phase of infection. The lowest concentration of SARS-CoV-2 viral copies this assay can detect is 138 copies/mL. A negative result does not preclude SARS-Cov-2 infection and should not be used as the sole basis for treatment  or other patient management decisions. A negative result may occur with  improper specimen collection/handling, submission of specimen other than nasopharyngeal swab, presence of viral mutation(s) within the areas targeted by this assay, and inadequate number of viral copies(<138 copies/mL). A negative result must be combined with clinical observations, patient history, and epidemiological information. The expected result is Negative.  Fact Sheet for Patients:  BloggerCourse.com  Fact Sheet for Healthcare Providers:  SeriousBroker.it  This test is no t yet approved or cleared by the [place] FDA and  has been authorized for detection and/or diagnosis of SARS-CoV-2 by FDA under an Emergency Use Authorization (EUA). This EUA will remain  in effect (meaning this test can be used) for the duration of the COVID-19 declaration under Section 564(b)(1) of the Act, 21 U.S.C.section 360bbb-3(b)(1), unless the authorization is terminated  or revoked sooner.       Influenza A by PCR NEGATIVE NEGATIVE Final   Influenza B by PCR NEGATIVE NEGATIVE Final    Comment: (NOTE) The Xpert Xpress SARS-CoV-2/FLU/RSV plus assay is intended as an aid in the diagnosis of influenza from Nasopharyngeal swab specimens and should not be used as a sole basis for treatment. Nasal washings and aspirates are unacceptable for Xpert Xpress SARS-CoV-2/FLU/RSV testing.  Fact Sheet for Patients: BloggerCourse.com  Fact Sheet for Healthcare Providers:  SeriousBroker.it  This test is not yet approved or cleared by the [place] and has been authorized for detection and/or diagnosis of SARS-CoV-2 by FDA under an Emergency Use Authorization (EUA). This EUA will remain in effect (meaning this test can be used) for the duration of the COVID-19 declaration under Section 564(b)(1) of the Act, 21 U.S.C. section  360bbb-3(b)(1), unless the authorization is terminated or revoked.     Resp Syncytial Virus by PCR NEGATIVE NEGATIVE Final    Comment: (NOTE) Fact Sheet for Patients: BloggerCourse.com  Fact Sheet for Healthcare Providers: SeriousBroker.it  This test is not yet approved or cleared by the [place] FDA and has been authorized for detection and/or diagnosis of SARS-CoV-2 by FDA under an Emergency Use Authorization (EUA). This EUA will remain in effect (meaning this test can be used) for the duration of the COVID-19 declaration under Section 564(b)(1) of the Act, 21 U.S.C. section 360bbb-3(b)(1), unless the authorization is terminated or revoked.  Performed at [facility], [address] [place], [place] [address]   Culture, blood (Routine X 2) w Reflex to ID Panel     Status: None (Preliminary result)   Collection Time: 01/28/24  9:38 PM   Specimen: BLOOD  Result Value Ref Range Status   Specimen Description BLOOD BLOOD RIGHT ARM  Final   Special Requests   Final    BOTTLES DRAWN AEROBIC AND ANAEROBIC Blood Culture adequate volume   Culture   Final    NO GROWTH 2 DAYS Performed at [facility], [address], [place], [place] [address]    Report Status PENDING  Incomplete  Culture, blood (Routine X 2) w Reflex to ID Panel     Status: None (Preliminary result)   Collection Time: 01/28/24  9:38 PM   Specimen: BLOOD  Result Value Ref Range Status   Specimen Description BLOOD BLOOD LEFT HAND  Final   Special Requests   Final    BOTTLES DRAWN AEROBIC AND ANAEROBIC Blood Culture adequate volume   Culture   Final    NO GROWTH 2 DAYS Performed at [facility], [address] [place], [place] [address]    Report Status PENDING  Incomplete   Studies/Results: MR ABDOMEN MRCP W WO CONTAST Result Date: 01/29/2024 CLINICAL DATA:  Right upper quadrant abdominal pain EXAM: MRI ABDOMEN WITHOUT AND  WITH CONTRAST (INCLUDING MRCP) TECHNIQUE: Multiplanar multisequence MR imaging of the abdomen was performed both before and after the administration of intravenous contrast. Heavily T2-weighted images of the biliary and pancreatic ducts were obtained, and three-dimensional MRCP images were rendered by post processing. CONTRAST:  7mL GADAVIST GADOBUTROL 1 MMOL/ML IV SOLN COMPARISON:  CT abdomen 01/28/2024 FINDINGS: Lower chest: Unremarkable Hepatobiliary: Cholecystectomy. Mild proximal intrahepatic biliary dilatation. Common hepatic duct 1.5 cm, common bile duct 1.4 cm in diameter, with slightly blunted distal margin of the CBD at the ampulla but I do not see a well-defined filling defect/stone. No differential enhancement in the vicinity of the ampulla/pancreatic head to indicate malignancy in this vicinity as a cause for the biliary prominence. Potential causes for the biliary dilatation include physiologic response to cholecystectomy and potentially ampullary stricture. Correlate with bilirubin levels. No focal hepatic parenchymal lesion observed. Pancreas: Borderline prominence of the dorsal pancreatic duct in the pancreatic head up to 3 mm in diameter. Spleen:  Unremarkable Adrenals/Urinary Tract:  Unremarkable Stomach/Bowel: Gastric bypass noted.  Patent gastrojejunostomy. 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 Musculoskeletal: Prior L2 vertebral augmentation. IMPRESSION: 1. Mild proximal intrahepatic biliary dilatation, common hepatic duct 1.5 cm, common bile duct 1.4 cm in diameter, with slightly blunted distal margin of the CBD at the ampulla but I do not see a well-defined filling defect/stone. Potential causes for the biliary dilatation include physiologic response to cholecystectomy and potentially ampullary stricture. Correlate with  bilirubin levels. 2. Borderline prominence of the dorsal pancreatic duct in the pancreatic head up to 3 mm in diameter. 3. Gastric bypass noted. Patent gastrojejunostomy. 4. 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 Electronically Signed   By: Gaylyn Rong M.D.   On: 01/29/2024 11:02   MR 3D Recon At Scanner Result Date: 01/29/2024 CLINICAL DATA:  Right upper quadrant abdominal pain EXAM: MRI ABDOMEN WITHOUT AND WITH CONTRAST (INCLUDING MRCP) TECHNIQUE: Multiplanar multisequence MR imaging of the abdomen was performed both before and after the administration of intravenous contrast. Heavily T2-weighted images of the biliary and pancreatic ducts were obtained, and three-dimensional MRCP images were rendered by post processing. CONTRAST:  7mL GADAVIST GADOBUTROL 1 MMOL/ML IV SOLN COMPARISON:  CT abdomen 01/28/2024 FINDINGS: Lower chest: Unremarkable Hepatobiliary: Cholecystectomy. Mild proximal intrahepatic biliary dilatation. Common hepatic duct 1.5 cm, common bile duct 1.4 cm in diameter, with slightly blunted distal margin of the CBD at the ampulla but I do not see a well-defined filling defect/stone. No differential enhancement in the vicinity of the ampulla/pancreatic head to indicate malignancy in this vicinity as a cause for the biliary prominence. Potential causes for the biliary dilatation include physiologic response to cholecystectomy and potentially ampullary stricture. Correlate with bilirubin levels. No focal hepatic parenchymal lesion observed. Pancreas: Borderline prominence of the dorsal pancreatic duct in the pancreatic head up to 3 mm in diameter. Spleen:  Unremarkable Adrenals/Urinary Tract:  Unremarkable Stomach/Bowel: Gastric bypass noted.  Patent gastrojejunostomy. 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 Musculoskeletal: Prior L2 vertebral augmentation. IMPRESSION: 1. Mild proximal intrahepatic biliary dilatation, common hepatic duct 1.5 cm, common bile duct 1.4 cm in diameter, with slightly blunted distal margin of the CBD at the ampulla but I do not see a well-defined filling defect/stone. Potential causes for the biliary dilatation include physiologic response to cholecystectomy and potentially ampullary stricture. Correlate with bilirubin levels. 2. Borderline prominence of the dorsal pancreatic duct in the pancreatic head up to 3 mm in diameter. 3. Gastric bypass noted. Patent gastrojejunostomy. 4. 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 Electronically Signed   By: Gaylyn Rong M.D.   On: 01/29/2024 11:02   CT ABDOMEN PELVIS W CONTRAST Result Date: 01/28/2024 CLINICAL DATA:  Abdominal pain. EXAM: CT ABDOMEN AND PELVIS WITH CONTRAST TECHNIQUE: Multidetector CT imaging of the abdomen and pelvis was performed using the standard protocol following bolus administration of intravenous contrast. RADIATION DOSE REDUCTION: This exam was performed according to the departmental dose-optimization program which includes automated exposure control, adjustment of the mA and/or kV according to patient size and/or use of iterative reconstruction technique. CONTRAST:  OMNIPAQUE IOHEXOL 300 MG/ML  SOLN COMPARISON:  August 09, 2022 FINDINGS: Lower chest: No acute abnormality. Hepatobiliary: No focal  liver abnormality is seen. Status post cholecystectomy. There is mild central intrahepatic biliary dilatation. The common bile duct measures 13 mm in diameter. Pancreas: Unremarkable. No pancreatic ductal dilatation or surrounding inflammatory changes. Spleen: Normal in size without focal abnormality. Adrenals/Urinary Tract: Adrenal glands are  unremarkable. Kidneys are normal in size, without renal calculi or hydronephrosis. Subcentimeter bilateral simple renal cysts are seen. Mild areas of cortical thinning and cortical scarring are noted within the left kidney. Bladder is unremarkable. Stomach/Bowel: Surgical sutures are seen throughout the gastric region. Surgically anastomosed bowel is also noted within the mid left abdomen. This is present on the prior study. An 8.7 cm long segment of dilated small bowel is seen along the site of surgical anastomosis (approximately 6.2 cm in diameter). Appendix appears normal. No evidence of bowel wall thickening or inflammatory changes. Vascular/Lymphatic: No significant vascular findings are present. No enlarged abdominal or pelvic lymph nodes. Reproductive: Small parenchymal calcification is seen within an otherwise normal appearing uterus. The bilateral adnexa are unremarkable. Other: No abdominal wall hernia or abnormality. No abdominopelvic ascites. Musculoskeletal: A chronic compression fracture deformity and subsequent vertebroplasty is seen at the level of L2. IMPRESSION: 1. Postoperative changes within the gastric region and mid left abdomen, as described above. 2. Dilated small bowel along the site of surgical anastomosis, which may represent a focal ileus. 3. Evidence of prior cholecystectomy with mild central intrahepatic biliary dilatation and dilatation of the common bile duct. 4. 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 Electronically Signed   By: Aram Candela M.D.   On: 01/28/2024 17:40   Medications: I have reviewed the patient's current medications. Scheduled Meds:  busPIRone  15 mg Oral BID   gabapentin  600 mg Oral Daily   heparin  5,000 Units Subcutaneous Q8H   hydrOXYzine  25 mg Oral TID   Continuous Infusions: PRN Meds:.albuterol, dextromethorphan-guaiFENesin, hydrALAZINE, ibuprofen, morphine injection, ondansetron (ZOFRAN) IV,  oxyCODONE, zolpidem   Assessment: Principal Problem:   Abnormal LFTs Active Problems:   HTN (hypertension)   HLD (hyperlipidemia)   Depression with anxiety   Abdominal pain   Nausea vomiting and diarrhea   Ileus (HCC)   RUQ abdominal pain   Transaminitis    Plan: This patient was found to have abnormal enzymes which can be related to the Tamiflu she was taking.  This can also be from other causes that are being worked up with the blood work that was sent off.  Due to there being no sign of acute GI bleeding the patient should have the anemia worked up as an outpatient and this may be delusional in addition to a possible other cause of anemia.  Nothing further to do from a GI point of view at this time.  I will sign off.  Please call if any further GI concerns or questions.  We would like to thank you for the opportunity to participate in the care of Natasha Estrada.    LOS: 2 days   Midge Minium, MD.FACG 01/30/2024, 8:25 AM Pager [phone] 7am-5pm  Check AMION for 5pm -7am coverage and on weekends

## 2024-01-31 LAB — CERULOPLASMIN: Ceruloplasmin: 25 mg/dL (ref 19.0–39.0)

## 2024-01-31 LAB — ALPHA-1-ANTITRYPSIN: A-1 Antitrypsin, Ser: 161 mg/dL (ref 101–187)

## 2024-02-02 LAB — CULTURE, BLOOD (ROUTINE X 2)
Culture: NO GROWTH
Culture: NO GROWTH
Special Requests: ADEQUATE
Special Requests: ADEQUATE

## 2024-02-19 ENCOUNTER — Ambulatory Visit: Payer: Self-pay | Admitting: Psychiatry

## 2024-03-14 ENCOUNTER — Ambulatory Visit

## 2024-03-19 ENCOUNTER — Ambulatory Visit: Payer: Federal, State, Local not specified - PPO | Admitting: Urology

## 2024-03-24 NOTE — Progress Notes (Unsigned)
 03/25/2024 9:04 PM   Natasha Estrada 1971-07-13 [id number]  Referring provider: Morton Areas, PA-C [address] [place],  [place] [address]  Urological history: 1. Complex cyst -MRI (03/2022) -  9 mm mildly complex cyst in the mid right kidney, Bosniak 2 -contrast CT(01/2024) - bilateral simple renal cysts   2.  Nephrolithiasis -CT renal stone study (08/2022) -  a 1.5 cm stone in the proximal left ureter with mild left hydronephrosis. There is no hydronephrosis or nephrolithiasis on the right. Similar appearance of a 1.5 cm linear cortical calcification of the inferior right kidney -s/p ESWL (08/17/2022)   3. rUTI's - cysto 05/2020 was NED  - CT renal stone study (08/2022) - left ureteral stone (treated w/ ESWL)  - contrast CT (01/2024) - no stones seen  -vaginal estrogen cream   No chief complaint on file.  HPI: Natasha Estrada is a 53 y.o. woman who presents today for yearly follow up for nephrolithiasis.  Previous records reviewed.     Patient had a CT during hospitalization for elevated LFT's in February and no stones were noted .  They are having (1 to 7) or (8 or more) daytime voids,  they are having nocturia (1-2) or (3 or more) and urgency is (none, mild, strong, severe).   They are having (stress, urge or mixed incontinence.)    they are having urinary leakage (1-2 times weekly, 3 or more times weekly, 1-2 times daily and 3 or more times daily) They are using absorbent products for leakage (no, sometimes, always )   the type of products they use are (panty liners, absorbant pads, depends) *** daily.  They are not limiting fluids.  They are not engaging in toilet mapping  ***   PVR ***  PMH: Past Medical History:  Diagnosis Date   Anxiety    Arthritis    Chronic low back pain with left-sided sciatica    Chronic pain syndrome    Depression    Fibroid    GERD (gastroesophageal reflux disease)    Heart murmur    Echo 09/14/22   History of  kidney stones    Hypertension    IBS (irritable bowel syndrome)    with constipation   Insomnia    Kidney lesion, native, left 09/2020   following renal mass protocol on 10/14/20   Kidney stone    Lumbar radiculopathy    Radiculitis, cervical    c7-c8   Sleep apnea    can't wear CPAP - anxiety   Syrinx of spinal cord (HCC) 2021   Urinary tract infection 10/2020   recurrent. on daily antibix treatment    Surgical History: Past Surgical History:  Procedure Laterality Date   BLADDER STONE REMOVAL  2018   CERVICAL CONE BIOPSY  2018   CHOLECYSTECTOMY     EXTRACORPOREAL SHOCK WAVE LITHOTRIPSY Left 08/17/2022   Procedure: EXTRACORPOREAL SHOCK WAVE LITHOTRIPSY (ESWL);  Surgeon: Geraline Knapp, MD;  Location: [facility];  Service: Urology;  Laterality: Left;   EXTRACORPOREAL SHOCK WAVE LITHOTRIPSY Left 09/07/2022   Procedure: EXTRACORPOREAL SHOCK WAVE LITHOTRIPSY (ESWL);  Surgeon: Geraline Knapp, MD;  Location: [facility];  Service: Urology;  Laterality: Left;   GASTRIC ROUX-EN-Y  2013   KYPHOPLASTY N/A 12/07/2020   Procedure: L2 Kyphoplasty;  Surgeon: Molli Angelucci, MD;  Location: [facility];  Service: Orthopedics;  Laterality: N/A;   OPEN REDUCTION INTERNAL FIXATION (ORIF) DISTAL RADIAL FRACTURE Left 09/22/2022   Procedure: OPEN REDUCTION INTERNAL FIXATION (ORIF)  DISTAL RADIUS FRACTURE;  Surgeon: Molli Angelucci, MD;  Location: [facility];  Service: Orthopedics;  Laterality: Left;  sleep apnea   ROTATOR CUFF REPAIR Left 2010   SHOULDER ARTHROSCOPY WITH SUBACROMIAL DECOMPRESSION AND OPEN ROTATOR C Left 10/19/2020   Procedure: LEFT SHOULDER ARTHROSCOPY WITH DEBRIDEMENT DECOMPRESSION AND POSSIBLE BICEP TENODESIS;  Surgeon: Elner Hahn, MD;  Location: [facility];  Service: Orthopedics;  Laterality: Left;   SHOULDER ARTHROSCOPY WITH SUBACROMIAL DECOMPRESSION AND OPEN ROTATOR C Left 12/08/2021   Procedure: Left shoulder arthroscopy with debridement, decompression, and rotator cuff repair;   Surgeon: Elner Hahn, MD;  Location: [facility];  Service: Orthopedics;  Laterality: Left;   TONSILLECTOMY     as a child   transforaminal epidural steroid injection Left 10/01/2020   C6-7    Home Medications:  Current Outpatient Medications on File Prior to Visit  Medication Sig Dispense Refill   acetaminophen  (TYLENOL ) 325 MG tablet Take 2 tablets (650 mg total) by mouth every 6 (six) hours as needed for mild pain (or Fever >/= 101).     alendronate (FOSAMAX) 70 MG tablet Take 70 mg by mouth once a week.     atorvastatin (LIPITOR) 20 MG tablet Take 20 mg by mouth daily.     benzonatate  (TESSALON  PERLES) 100 MG capsule Take 1 capsule (100 mg total) by mouth 3 (three) times daily as needed for cough. 30 capsule 0   busPIRone  (BUSPAR ) 15 MG tablet Take 15 mg by mouth 2 (two) times daily.     EPINEPHrine  0.3 mg/0.3 mL IJ SOAJ injection Inject 0.3 mg into the muscle once as needed (if patient exhibits significant signs and symptoms of allergic reaction.). (Patient not taking: Reported on 01/28/2024) 1 each 0   gabapentin  (NEURONTIN ) 600 MG tablet Take 600 mg by mouth daily.     hydrOXYzine  (VISTARIL ) 25 MG capsule Take 25 mg by mouth 3 (three) times daily.     losartan (COZAAR) 50 MG tablet Take 50 mg by mouth daily.     methylphenidate  (CONCERTA ) 18 MG PO CR tablet Take 1 tablet (18 mg total) by mouth daily before breakfast. 30 tablet 0   zolpidem  (AMBIEN ) 10 MG tablet Take 10 mg by mouth daily.     No current facility-administered medications on file prior to visit.    Allergies:  Allergies  Allergen Reactions   Cefuroxime Shortness Of Breath    1.) Tolerated 1st generation cephalosporin (CEPHALEXIN ) courses on 10/17/2021 and 11/11/2021 without documented ADRs. 2.) Tolerates Augmentin  per husband Joe   Sulfa Antibiotics Rash    Family History: Family History  Problem Relation Age of Onset   Hypertension Mother     Social History:  reports that she has never smoked. She has never  used smokeless tobacco. She reports that she does not currently use alcohol. She reports that she does not use drugs.  ROS: Pertinent ROS in HPI  Physical Exam: LMP  (LMP Unknown)   Constitutional:  Well nourished. Alert and oriented, No acute distress. HEENT: [place] AT, moist mucus membranes.  Trachea midline, no masses. Cardiovascular: No clubbing, cyanosis, or edema. Respiratory: Normal respiratory effort, no increased work of breathing. GU: No CVA tenderness.  No bladder fullness or masses.  Recession of labia minora, dry, pale vulvar vaginal mucosa and loss of mucosal ridges and folds.  Normal urethral meatus, no lesions, no prolapse, no discharge.   No urethral masses, tenderness and/or tenderness. No bladder fullness, tenderness or masses. *** vagina mucosa, *** estrogen effect,  no discharge, no lesions, *** pelvic support, *** cystocele and *** rectocele noted.  No cervical motion tenderness.  Uterus is freely mobile and non-fixed.  No adnexal/parametria masses or tenderness noted.  Anus and perineum are without rashes or lesions.   ***  Neurologic: Grossly intact, no focal deficits, moving all 4 extremities. Psychiatric: Normal mood and affect.    Laboratory Data: Complete Blood Count (CBC) Order: [id number] Component Ref Range & Units 1 mo ago  WBC (White Blood Cell Count) 3.2 - 9.8 x10^9/L 3.2  Comment: CONFIRMED BY REPEAT  Hemoglobin 11.7 - 15.5 g/dL 40.9 Low   Hematocrit 81.1 - 45.0 % 35.5  Platelets 150 - 450 x10^9/L 277  MCV (Mean Corpuscular Volume) 80 - 98 fL 86  MCH (Mean Corpuscular Hemoglobin) 26.5 - 34.0 pg 27.1  MCHC (Mean Corpuscular Hemoglobin Concentration) 31.0 - 36.0 % 31.5  RBC (Red Blood Cell Count) 3.77 - 5.16 x10^12/L 4.14  RDW-CV (Red Cell Distribution Width) 11.5 - 14.5 % 13.3  NRBC (Nucleated Red Blood Cell Count) 0 x10^9/L 0  NRBC % (Nucleated Red Blood Cell %) % 0  MPV (Mean Platelet Volume) 7.2 - 11.7 fL 10.1  Resulting Agency [facility] CL[facility]ICAL  LABORATORY   Specimen Collected: 02/15/24 04:43   Performed by: [facility] CLINICAL LABORATORY Last Resulted: 02/15/24 05:34  Received From: [facility]  Result Received: 03/24/24 21:03   Basic Metabolic Panel (BMP) Order: [id number] Component Ref Range & Units 1 mo ago  Sodium 135 - 145 mmol/L 139  Potassium 3.5 - 5.0 mmol/L 4.3  Chloride 98 - 108 mmol/L 105  Carbon Dioxide (CO2) 21 - 30 mmol/L 27  Urea Nitrogen (BUN) 7 - 20 mg/dL 11  Creatinine 0.4 - 1.0 mg/dL 1  Glucose 70 - 213 mg/dL 80  Comment: Interpretive Data: Above is the NONFASTING reference range.  Below are the FASTING reference ranges: NORMAL:      70-99 mg/dL PREDIABETES: 086-578 mg/dL DIABETES:    > 469 mg/dL  Calcium 8.7 - 62.9 mg/dL 9.2  Anion Gap 3 - 12 mmol/L 7  BUN/CREA Ratio 6 - 27 11  Glomerular Filtration Rate (eGFR) mL/min/1.73sq m 67  Comment: CKD-EPI (2021) does not include patient's race in the calculation of eGFR. Monitoring changes of plasma creatinine and eGFR over time is useful for monitoring kidney function.  This change was made on 02/01/2021.  Interpretive Ranges for eGFR(CKD-EPI 2021):  eGFR:              > 60 mL/min/1.73 sq m - Normal eGFR:              30 - 59 mL/min/1.73 sq m - Moderately Decreased eGFR:              15 - 29 mL/min/1.73 sq m - Severely Decreased eGFR:              < 15 mL/min/1.73 sq m -  Kidney Failure   Note: These eGFR calculations do not apply in acute situations when eGFR is changing rapidly or in patients on dialysis.  Resulting Agency [facility] CLINICAL LABORATORY   Specimen Collected: 02/15/24 04:43   Performed by: [facility] CLINICAL LABORATORY Last Resulted: 02/15/24 06:02  Received From: [facility]  Result Received: 03/24/24 21:03   Urinalysis Component     Latest Ref Rng 01/28/2024  Specific Gravity, UA     1.005 - 1.030    pH, UA     5.0 - 7.5    Color,  UA     Yellow    Appearance Ur     Clear    Leukocytes,UA      Negative    Protein,UA     Negative/Trace    Glucose, UA     NEGATIVE mg/dL NEGATIVE   Ketones, UA     Negative    RBC, UA     Negative    Bilirubin, UA     Negative    Urobilinogen, Ur     0.2 - 1.0 mg/dL   Nitrite, UA     Negative    Microscopic Examination   WBC, UA     0 - 5 WBC/hpf 0-5   RBC, Urine     0 - 2 /hpf   Epithelial Cells (non renal)     0 - 10 /hpf   Bacteria, UA     NONE SEEN  NONE SEEN   Color, Urine     YELLOW  YELLOW !   Appearance     CLEAR  CLEAR !   Specific Gravity, Urine     1.005 - 1.030  1.039 (H)   pH     5.0 - 8.0  5.0   Hgb urine dipstick     NEGATIVE  NEGATIVE   Bilirubin Urine     NEGATIVE  NEGATIVE   Ketones, ur     NEGATIVE mg/dL NEGATIVE   Protein     NEGATIVE mg/dL NEGATIVE   Nitrite     NEGATIVE  NEGATIVE   Leukocytes,Ua     NEGATIVE  TRACE !   RBC / HPF     0 - 5 RBC/hpf 0-5   Squamous Epithelial / HPF     0 - 5 /HPF 0-5   Mucus PRESENT   Casts     None seen /lpf   Cast Type     N/A    Renal Epithel, UA     None seen /hpf   Crystals     N/A    Crystal Type     N/A    WBC Clumps   Hyaline Casts, UA   Mucus, UA     Not Estab.      Legend: ! Abnormal (H) High I have reviewed the labs.   Pertinent Imaging: CLINICAL DATA:  Abdominal pain.   EXAM: CT ABDOMEN AND PELVIS WITH CONTRAST   TECHNIQUE: Multidetector CT imaging of the abdomen and pelvis was performed using the standard protocol following bolus administration of intravenous contrast.   RADIATION DOSE REDUCTION: This exam was performed according to the departmental dose-optimization program which includes automated exposure control, adjustment of the mA and/or kV according to patient size and/or use of iterative reconstruction technique.   CONTRAST:  OMNIPAQUE  IOHEXOL  300 MG/ML  SOLN   COMPARISON:  August 09, 2022   FINDINGS: Lower chest: No acute abnormality.   Hepatobiliary: No focal liver abnormality is seen. Status  post cholecystectomy. There is mild central intrahepatic biliary dilatation. The common bile duct measures 13 mm in diameter.   Pancreas: Unremarkable. No pancreatic ductal dilatation or surrounding inflammatory changes.   Spleen: Normal in size without focal abnormality.   Adrenals/Urinary Tract: Adrenal glands are unremarkable. Kidneys are normal in size, without renal calculi or hydronephrosis. Subcentimeter bilateral simple renal cysts are seen. Mild areas of cortical thinning and cortical scarring are noted within the left kidney. Bladder is unremarkable.   Stomach/Bowel: Surgical sutures are seen throughout the gastric region. Surgically anastomosed bowel is also noted  within the mid left abdomen. This is present on the prior study. An 8.7 cm long segment of dilated small bowel is seen along the site of surgical anastomosis (approximately 6.2 cm in diameter). Appendix appears normal. No evidence of bowel wall thickening or inflammatory changes.   Vascular/Lymphatic: No significant vascular findings are present. No enlarged abdominal or pelvic lymph nodes.   Reproductive: Small parenchymal calcification is seen within an otherwise normal appearing uterus. The bilateral adnexa are unremarkable.   Other: No abdominal wall hernia or abnormality. No abdominopelvic ascites.   Musculoskeletal: A chronic compression fracture deformity and subsequent vertebroplasty is seen at the level of L2.   IMPRESSION: 1. Postoperative changes within the gastric region and mid left abdomen, as described above. 2. Dilated small bowel along the site of surgical anastomosis, which may represent a focal ileus. 3. Evidence of prior cholecystectomy with mild central intrahepatic biliary dilatation and dilatation of the common bile duct. 4. 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     Electronically Signed   By: Virgle Grime M.D.   On:  01/28/2024 17:40 I have independently reviewed the films.  See HPI.    Assessment & Plan:    1.  Nephrolithiasis -CT in February negative for stones  2.  Incontinence -Mild and nonbothersome at this time  3.  Vaginal atrophy -She states she has vaginal estrogen cream at home and I encouraged her to start applying it 3 nights weekly explaining to her that it will reduce the number of urinary tract infections   No follow-ups on file.  These notes generated with voice recognition software. I apologize for typographical errors.  Briant Camper  [facility] [address]  [address] [place], [place] [address] [phone]

## 2024-03-25 ENCOUNTER — Ambulatory Visit
Admission: RE | Admit: 2024-03-25 | Discharge: 2024-03-25 | Disposition: A | Discharge status: Home / Self Care | Attending: Urology | Admitting: Urology

## 2024-03-25 ENCOUNTER — Ambulatory Visit: Payer: Federal, State, Local not specified - PPO | Admitting: Urology

## 2024-03-25 ENCOUNTER — Other Ambulatory Visit: Payer: Self-pay

## 2024-03-25 ENCOUNTER — Ambulatory Visit
Admission: RE | Admit: 2024-03-25 | Discharge: 2024-03-25 | Disposition: A | Discharge status: Home / Self Care | Source: Ambulatory Visit | Attending: Urology | Admitting: Urology

## 2024-03-25 ENCOUNTER — Encounter: Payer: Self-pay | Admitting: Urology

## 2024-03-25 VITALS — BP 111/68 | HR 88 | Ht 64.0 in | Wt 152.0 lb

## 2024-03-25 DIAGNOSIS — N200 Calculus of kidney: Secondary | ICD-10-CM | POA: Diagnosis present

## 2024-03-25 DIAGNOSIS — R1031 Right lower quadrant pain: Secondary | ICD-10-CM | POA: Diagnosis not present

## 2024-03-25 DIAGNOSIS — N952 Postmenopausal atrophic vaginitis: Secondary | ICD-10-CM

## 2024-03-25 DIAGNOSIS — N3946 Mixed incontinence: Secondary | ICD-10-CM | POA: Diagnosis not present

## 2024-03-25 LAB — MICROSCOPIC EXAMINATION

## 2024-03-25 LAB — URINALYSIS, COMPLETE
Bilirubin, UA: NEGATIVE
Ketones, UA: NEGATIVE
Nitrite, UA: NEGATIVE
Protein,UA: NEGATIVE
RBC, UA: NEGATIVE
Specific Gravity, UA: 1.01 (ref 1.005–1.030)
Urobilinogen, Ur: 0.2 mg/dL (ref 0.2–1.0)
pH, UA: 6 (ref 5.0–7.5)

## 2024-03-25 LAB — BLADDER SCAN AMB NON-IMAGING: Scan Result: 0

## 2024-03-28 LAB — CULTURE, URINE COMPREHENSIVE

## 2024-03-31 ENCOUNTER — Telehealth: Payer: Self-pay

## 2024-03-31 NOTE — Telephone Encounter (Signed)
 Pt called triage line stating if she could get something sent in for UTI, pt saw culture results via MyChart. Pt was informed provider was out of the office and will contact her when we get a response. Pt states she would like a refill on her vaginal cream, states she lost it. Please advise.

## 2024-07-05 ENCOUNTER — Other Ambulatory Visit: Payer: Self-pay | Admitting: Physician Assistant

## 2024-07-08 ENCOUNTER — Other Ambulatory Visit: Payer: Self-pay

## 2024-07-08 ENCOUNTER — Other Ambulatory Visit: Payer: Self-pay | Admitting: Urology

## 2024-07-08 MED ORDER — TRIMETHOPRIM 100 MG PO TABS: 100.0000 mg | ORAL_TABLET | Freq: Every day | ORAL | 0 refills | Status: AC

## 2024-07-25 ENCOUNTER — Other Ambulatory Visit: Payer: Self-pay

## 2024-07-25 ENCOUNTER — Other Ambulatory Visit: Payer: Self-pay | Admitting: Family Medicine

## 2024-07-25 ENCOUNTER — Emergency Department
Admission: EM | Admit: 2024-07-25 | Discharge: 2024-07-25 | Disposition: A | Discharge status: Home / Self Care | Attending: Emergency Medicine | Admitting: Emergency Medicine

## 2024-07-25 DIAGNOSIS — M5410 Radiculopathy, site unspecified: Secondary | ICD-10-CM

## 2024-07-25 DIAGNOSIS — M79622 Pain in left upper arm: Secondary | ICD-10-CM | POA: Diagnosis present

## 2024-07-25 DIAGNOSIS — M25512 Pain in left shoulder: Secondary | ICD-10-CM | POA: Insufficient documentation

## 2024-07-25 DIAGNOSIS — M5430 Sciatica, unspecified side: Secondary | ICD-10-CM | POA: Insufficient documentation

## 2024-07-25 DIAGNOSIS — I129 Hypertensive chronic kidney disease with stage 1 through stage 4 chronic kidney disease, or unspecified chronic kidney disease: Secondary | ICD-10-CM | POA: Diagnosis not present

## 2024-07-25 DIAGNOSIS — M542 Cervicalgia: Secondary | ICD-10-CM

## 2024-07-25 DIAGNOSIS — N183 Chronic kidney disease, stage 3 unspecified: Secondary | ICD-10-CM | POA: Diagnosis not present

## 2024-07-25 DIAGNOSIS — M5412 Radiculopathy, cervical region: Secondary | ICD-10-CM | POA: Diagnosis not present

## 2024-07-25 DIAGNOSIS — M5416 Radiculopathy, lumbar region: Secondary | ICD-10-CM

## 2024-07-25 DIAGNOSIS — G8929 Other chronic pain: Secondary | ICD-10-CM | POA: Insufficient documentation

## 2024-07-25 MED ORDER — PREDNISONE 20 MG PO TABS: ORAL_TABLET | ORAL | 0 refills | Status: AC

## 2024-07-25 NOTE — Discharge Instructions (Signed)
 Keep your appointment with Gustavo Brain, PA-C on Tuesday. Prescription for prednisone  was sent to the pharmacy.  Continue your regular medications.  You may also use ice or heat to your shoulder as needed for discomfort.

## 2024-07-25 NOTE — ED Provider Notes (Signed)
 [facility] Provider Note    Event Date/Time   First MD Initiated Contact with Patient 07/25/24 1229     (approximate)   History   Leg Pain   HPI  Natasha Estrada is a 53 y.o. female   presents to the ED with complaint of left arm pain after completing physical therapy today.  Patient reports that she has had rotator cuff surgery and states that physical therapy has increased her pain along which she is having some radiculopathy starting with her neck and going down her left arm.  She also reports an increase in her sciatic symptoms in her left lower extremity.  She denies any new symptoms or new injuries.  Patient has a longstanding history of chronic issues and is currently being seen at [facility], physical therapy, Dr. Avanell and also [facility].  She continues to take gabapentin  600 mg at bedtime.  Patient has history of hypertension, sleep apnea, gastric bypass surgery, IBS, ileus, urolithiasis, nephrolithiasis, acute pyelonephritis, stage III chronic kidney disease, depression, spinal stenosis lumbar region, vitamin D  and B12 deficiency, generalized anxiety disorder.      Physical Exam   Triage Vital Signs: ED Triage Vitals  Encounter Vitals Group     BP 07/25/24 1131 111/78     Girls Systolic BP Percentile --      Girls Diastolic BP Percentile --      Boys Systolic BP Percentile --      Boys Diastolic BP Percentile --      Pulse Rate 07/25/24 1131 87     Resp 07/25/24 1131 18     Temp 07/25/24 1131 98 F (36.7 C)     Temp src --      SpO2 07/25/24 1131 100 %     Weight 07/25/24 1130 160 lb (72.6 kg)     Height 07/25/24 1130 5' 5 (1.651 m)     Head Circumference --      Peak Flow --      Pain Score 07/25/24 1130 9     Pain Loc --      Pain Education --      Exclude from Growth Chart --     Most recent vital signs: Vitals:   07/25/24 1131  BP: 111/78  Pulse: 87  Resp: 18  Temp: 98 F (36.7 C)  SpO2: 100%      General: Awake, no distress.  CV:  Good peripheral perfusion.  Heart regular rate and rhythm. Resp:  Normal effort.  Lungs clear bilaterally. Abd:  No distention.  Other:  Left shoulder without gross deformity.  There is a well-healed surgical scar present.  Range of motion is slow and guarded secondary to discomfort.  There is generalized tenderness on palpation.  Radial pulse present.  Skin is intact.  Minimal tenderness on palpation of cervical spine posteriorly.  Patient is able to stand and ambulate without any assistance.   ED Results / Procedures / Treatments   Labs (all labs ordered are listed, but only abnormal results are displayed) Labs Reviewed - No data to display    PROCEDURES:  Critical Care performed:   Procedures   MEDICATIONS ORDERED IN ED: Medications - No data to display   IMPRESSION / MDM / ASSESSMENT AND PLAN / ED COURSE  I reviewed the triage vital signs and the nursing notes.   Differential diagnosis includes, but is not limited to, acute exacerbation of chronic pain, exacerbation of pain following physical therapy today, cervical radiculopathy,  chronic back pain with left lower extremity sciatica ongoing.  53 year old female presents to the ED with increased discomfort following physical therapy today with her left arm.  She reports that she has an appointment with [facility] orthopedic department on Tuesday.  She also reports that she has been seeing someone in Dr. Avanell office and that [place] told her to come to the emergency department.  Looking through her records at [facility] it appears that a notation was made that if she is not improving that the office would order an MRI to further evaluate her chronic pain.  Her last MRI was 2023.  Since there is no history of recent injury and patient is experiencing some increased discomfort and pain following her physical therapy appointment today a course of steroids was offered to her until  she can be seen by [facility] orthopedic department on Tuesday.  Patient is continue with her regular medications including the gabapentin  until that time.  Because of her gastric bypass surgery she is not a candidate for anti-inflammatories and we also discussed that as well.      Patient's presentation is most consistent with Patient's presentation is most consistent with exacerbation of chronic illness.   FINAL CLINICAL IMPRESSION(S) / ED DIAGNOSES   Final diagnoses:  Chronic left shoulder pain  Sciatica, unspecified laterality  Cervical pain (neck)  Radiculopathy, unspecified spinal region     Rx / DC Orders   ED Discharge Orders          Ordered    predniSONE  (DELTASONE ) 20 MG tablet        07/25/24 1429             Note:  This document was prepared using Dragon voice recognition software and may include unintentional dictation errors.   Saunders Shona CROME, PA-C 07/25/24 1545    Dorothyann Drivers, MD 07/26/24 [phone]

## 2024-07-25 NOTE — ED Triage Notes (Signed)
 Pt comes with left arm and leg pain. Pt states this started two week ago and has gotten worse. Pt states it is her sciatic and having numbness in leg. Pt states she went to PT. Pt states left arm that started to hurt on Tuesday and then swelling on Wed. Pt states hx of aspiration to her arm.

## 2024-09-16 ENCOUNTER — Other Ambulatory Visit: Payer: Self-pay | Admitting: Student

## 2024-09-16 DIAGNOSIS — Z1231 Encounter for screening mammogram for malignant neoplasm of breast: Secondary | ICD-10-CM

## 2024-09-17 ENCOUNTER — Ambulatory Visit
Admission: RE | Admit: 2024-09-17 | Discharge: 2024-09-17 | Disposition: A | Discharge status: Home / Self Care | Source: Ambulatory Visit | Attending: Student | Admitting: Student

## 2024-09-17 DIAGNOSIS — Z1231 Encounter for screening mammogram for malignant neoplasm of breast: Secondary | ICD-10-CM | POA: Insufficient documentation

## 2024-09-18 ENCOUNTER — Inpatient Hospital Stay
Admission: RE | Admit: 2024-09-18 | Discharge: 2024-09-18 | Disposition: A | Discharge status: Home / Self Care | Payer: Self-pay | Source: Ambulatory Visit | Attending: Student | Admitting: Student

## 2024-09-18 ENCOUNTER — Other Ambulatory Visit: Payer: Self-pay | Admitting: *Deleted

## 2024-09-18 DIAGNOSIS — Z1231 Encounter for screening mammogram for malignant neoplasm of breast: Secondary | ICD-10-CM

## 2024-10-08 ENCOUNTER — Ambulatory Visit
Admission: RE | Admit: 2024-10-08 | Discharge: 2024-10-08 | Disposition: A | Discharge status: Home / Self Care | Payer: Worker's Compensation | Source: Ambulatory Visit | Attending: Family Medicine | Admitting: Family Medicine

## 2024-10-08 DIAGNOSIS — M5416 Radiculopathy, lumbar region: Secondary | ICD-10-CM

## 2024-10-20 ENCOUNTER — Other Ambulatory Visit: Payer: Self-pay

## 2024-10-20 NOTE — Telephone Encounter (Unsigned)
 Copied from CRM #[id number]. Topic: Clinical - Medication Question >> Oct 20, 2024 12:37 PM Natasha Estrada wrote: Pt wants to know If Dt [facility] will be able to write her prescriptions for gabapentin  and zolpidem  as she will be out by the end of the month and wants to be proactive with finding a dr who is able to fill these prescriptions so she's not left without the medications after seeing [facility]

## 2024-10-27 ENCOUNTER — Ambulatory Visit: Payer: Self-pay | Admitting: Family Medicine

## 2024-12-22 ENCOUNTER — Other Ambulatory Visit: Payer: Self-pay | Admitting: Surgery

## 2024-12-22 DIAGNOSIS — M7581 Other shoulder lesions, right shoulder: Secondary | ICD-10-CM

## 2024-12-22 DIAGNOSIS — M7521 Bicipital tendinitis, right shoulder: Secondary | ICD-10-CM

## 2024-12-24 ENCOUNTER — Ambulatory Visit: Admission: RE | Admit: 2024-12-24 | Source: Ambulatory Visit

## 2024-12-26 ENCOUNTER — Ambulatory Visit: Admission: RE | Admit: 2024-12-26 | Source: Ambulatory Visit

## 2024-12-31 ENCOUNTER — Ambulatory Visit
Admission: RE | Admit: 2024-12-31 | Discharge: 2024-12-31 | Disposition: A | Discharge status: Home / Self Care | Source: Ambulatory Visit | Attending: Surgery | Admitting: Surgery

## 2024-12-31 DIAGNOSIS — M7521 Bicipital tendinitis, right shoulder: Secondary | ICD-10-CM | POA: Insufficient documentation

## 2024-12-31 DIAGNOSIS — M7581 Other shoulder lesions, right shoulder: Secondary | ICD-10-CM | POA: Insufficient documentation

## 2025-01-29 ENCOUNTER — Ambulatory Visit: Admitting: Student in an Organized Health Care Education/Training Program

## 2025-03-25 ENCOUNTER — Ambulatory Visit: Admitting: Urology
